# Patient Record
Sex: Male | Born: 1935
Health system: Southern US, Community
[De-identification: ages and names within clinical notes are randomized; demographics above are authoritative.]

## PROBLEM LIST (undated history)

## (undated) DIAGNOSIS — Z95828 Presence of other vascular implants and grafts: Secondary | ICD-10-CM

## (undated) DIAGNOSIS — Z951 Presence of aortocoronary bypass graft: Secondary | ICD-10-CM

## (undated) DIAGNOSIS — I10 Essential (primary) hypertension: Secondary | ICD-10-CM

## (undated) DIAGNOSIS — N281 Cyst of kidney, acquired: Secondary | ICD-10-CM

## (undated) DIAGNOSIS — Z9889 Other specified postprocedural states: Secondary | ICD-10-CM

## (undated) DIAGNOSIS — Z86718 Personal history of other venous thrombosis and embolism: Secondary | ICD-10-CM

## (undated) DIAGNOSIS — Z87448 Personal history of other diseases of urinary system: Secondary | ICD-10-CM

## (undated) DIAGNOSIS — N138 Other obstructive and reflux uropathy: Secondary | ICD-10-CM

## (undated) DIAGNOSIS — I2510 Atherosclerotic heart disease of native coronary artery without angina pectoris: Secondary | ICD-10-CM

## (undated) DIAGNOSIS — E785 Hyperlipidemia, unspecified: Secondary | ICD-10-CM

## (undated) DIAGNOSIS — N401 Enlarged prostate with lower urinary tract symptoms: Secondary | ICD-10-CM

## (undated) DIAGNOSIS — R413 Other amnesia: Secondary | ICD-10-CM

## (undated) DIAGNOSIS — Z9289 Personal history of other medical treatment: Secondary | ICD-10-CM

## (undated) DIAGNOSIS — F0390 Unspecified dementia without behavioral disturbance: Secondary | ICD-10-CM

## (undated) DIAGNOSIS — M1990 Unspecified osteoarthritis, unspecified site: Secondary | ICD-10-CM

## (undated) HISTORY — DX: Essential (primary) hypertension: I10

## (undated) HISTORY — DX: Personal history of other venous thrombosis and embolism: Z86.718

## (undated) HISTORY — DX: Personal history of other diseases of urinary system: Z87.448

## (undated) HISTORY — DX: Personal history of other medical treatment: Z92.89

## (undated) HISTORY — DX: Presence of aortocoronary bypass graft: Z95.1

## (undated) HISTORY — DX: Hyperlipidemia, unspecified: E78.5

## (undated) HISTORY — DX: Cyst of kidney, acquired: N28.1

## (undated) HISTORY — DX: Benign prostatic hyperplasia with lower urinary tract symptoms: N40.1

## (undated) HISTORY — PX: OTHER SURGICAL HISTORY: SHX169

## (undated) HISTORY — DX: Other obstructive and reflux uropathy: N13.8

## (undated) HISTORY — DX: Atherosclerotic heart disease of native coronary artery without angina pectoris: I25.10

## (undated) HISTORY — PX: HERNIA REPAIR: SHX51

## (undated) HISTORY — DX: Presence of other vascular implants and grafts: Z95.828

---
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
## 1998-11-11 ENCOUNTER — Other Ambulatory Visit: Admission: RE | Admit: 1998-11-11 | Discharge: 1998-11-11 | Payer: Self-pay | Admitting: Urology

## 1999-11-22 ENCOUNTER — Encounter (INDEPENDENT_AMBULATORY_CARE_PROVIDER_SITE_OTHER): Payer: Self-pay

## 1999-11-22 ENCOUNTER — Ambulatory Visit (HOSPITAL_COMMUNITY): Admission: RE | Admit: 1999-11-22 | Discharge: 1999-11-22 | Payer: Self-pay | Admitting: *Deleted

## 2002-10-31 ENCOUNTER — Ambulatory Visit (HOSPITAL_COMMUNITY): Admission: RE | Admit: 2002-10-31 | Discharge: 2002-10-31 | Payer: Self-pay | Admitting: *Deleted

## 2002-10-31 ENCOUNTER — Encounter (INDEPENDENT_AMBULATORY_CARE_PROVIDER_SITE_OTHER): Payer: Self-pay | Admitting: *Deleted

## 2003-06-21 DIAGNOSIS — Z951 Presence of aortocoronary bypass graft: Secondary | ICD-10-CM

## 2003-06-21 DIAGNOSIS — I2510 Atherosclerotic heart disease of native coronary artery without angina pectoris: Secondary | ICD-10-CM

## 2003-06-21 HISTORY — DX: Atherosclerotic heart disease of native coronary artery without angina pectoris: I25.10

## 2003-06-21 HISTORY — DX: Presence of aortocoronary bypass graft: Z95.1

## 2003-12-11 ENCOUNTER — Ambulatory Visit (HOSPITAL_COMMUNITY): Admission: RE | Admit: 2003-12-11 | Discharge: 2003-12-11 | Payer: Self-pay | Admitting: Cardiology

## 2003-12-23 ENCOUNTER — Inpatient Hospital Stay (HOSPITAL_COMMUNITY): Admission: AD | Admit: 2003-12-23 | Discharge: 2003-12-29 | Payer: Self-pay | Admitting: Cardiology

## 2003-12-23 HISTORY — PX: CARDIAC CATHETERIZATION: SHX172

## 2003-12-25 DIAGNOSIS — Z951 Presence of aortocoronary bypass graft: Secondary | ICD-10-CM | POA: Insufficient documentation

## 2003-12-25 HISTORY — PX: CORONARY ARTERY BYPASS GRAFT: SHX141

## 2004-06-20 DIAGNOSIS — Z9889 Other specified postprocedural states: Secondary | ICD-10-CM

## 2004-06-20 HISTORY — DX: Other specified postprocedural states: Z98.890

## 2004-06-29 ENCOUNTER — Inpatient Hospital Stay (HOSPITAL_COMMUNITY): Admission: RE | Admit: 2004-06-29 | Discharge: 2004-07-04 | Payer: Self-pay | Admitting: Vascular Surgery

## 2004-06-29 ENCOUNTER — Encounter (INDEPENDENT_AMBULATORY_CARE_PROVIDER_SITE_OTHER): Payer: Self-pay | Admitting: *Deleted

## 2005-02-18 ENCOUNTER — Encounter (INDEPENDENT_AMBULATORY_CARE_PROVIDER_SITE_OTHER): Payer: Self-pay | Admitting: Specialist

## 2005-02-18 ENCOUNTER — Ambulatory Visit (HOSPITAL_COMMUNITY): Admission: RE | Admit: 2005-02-18 | Discharge: 2005-02-18 | Payer: Self-pay | Admitting: *Deleted

## 2005-03-29 HISTORY — PX: OTHER SURGICAL HISTORY: SHX169

## 2007-07-02 ENCOUNTER — Encounter (INDEPENDENT_AMBULATORY_CARE_PROVIDER_SITE_OTHER): Payer: Self-pay | Admitting: *Deleted

## 2007-07-02 ENCOUNTER — Ambulatory Visit (HOSPITAL_COMMUNITY): Admission: RE | Admit: 2007-07-02 | Discharge: 2007-07-02 | Payer: Self-pay | Admitting: *Deleted

## 2008-04-14 ENCOUNTER — Encounter: Admission: RE | Admit: 2008-04-14 | Discharge: 2008-04-14 | Payer: Self-pay | Admitting: Internal Medicine

## 2010-08-30 ENCOUNTER — Other Ambulatory Visit: Payer: Self-pay | Admitting: Internal Medicine

## 2010-08-30 DIAGNOSIS — I714 Abdominal aortic aneurysm, without rupture, unspecified: Secondary | ICD-10-CM

## 2010-09-01 ENCOUNTER — Ambulatory Visit
Admission: RE | Admit: 2010-09-01 | Discharge: 2010-09-01 | Disposition: A | Discharge status: Home / Self Care | Payer: Medicare Other | Source: Ambulatory Visit | Attending: Internal Medicine | Admitting: Internal Medicine

## 2010-09-01 DIAGNOSIS — I714 Abdominal aortic aneurysm, without rupture, unspecified: Secondary | ICD-10-CM

## 2010-10-19 DIAGNOSIS — N281 Cyst of kidney, acquired: Secondary | ICD-10-CM

## 2010-10-19 DIAGNOSIS — Z9289 Personal history of other medical treatment: Secondary | ICD-10-CM

## 2010-10-19 HISTORY — DX: Cyst of kidney, acquired: N28.1

## 2010-10-19 HISTORY — DX: Personal history of other medical treatment: Z92.89

## 2010-10-22 HISTORY — PX: OTHER SURGICAL HISTORY: SHX169

## 2010-11-02 NOTE — Op Note (Signed)
NAME:  RANARD, HARTE NO.:  [id number]   MEDICAL RECORD NO.:  [id number]          PATIENT TYPE:  AMB   LOCATION:  ENDO                         FACILITY:  [facility]   PHYSICIAN:  Georgiana Spinner, M.D.    DATE OF BIRTH:  1927-08-16   DATE OF PROCEDURE:  07/02/2007  DATE OF DISCHARGE:                               OPERATIVE REPORT   PROCEDURE:  Colonoscopy with polypectomy and biopsy.   INDICATIONS:  Colon polyps, colon cancer screening.   ANESTHESIA:  Fentanyl 50 mcg, Versed 2 mg.   PROCEDURE:  With the patient mildly sedated in the left lateral  decubitus position, a rectal exam was performed which was unremarkable.  Subsequently the Pentax videoscopic colonoscope was inserted the rectum  and passed under direct vision with pressure applied to reach the cecum  identified by ileocecal valve and base of cecum both which were  photographed.  From this point the colonoscope was slowly withdrawn  taking circumferential views of colonic mucosa stopping in the ascending  colon where two polyps were seen and each were removed first using snare  cautery technique setting of 20/150 blended current.  There was some  residual polyp at each site that was removed with hot biopsy forceps and  all the tissue was retrieved and placed in one bottle.  The endoscope  was then further withdrawn taking circumferential views of remaining  colonic mucosa stopping in the rectum which appeared normal on direct  and retroflexed view.  The endoscope was straightened and withdrawn.  The patient's vital signs, pulse oximeter remained stable.  The patient  tolerated procedure well without apparent complications.   FINDINGS:  Two polyps of ascending colon both under a centimeter in  size, both photographed and removed as described above.   PLAN:  Await biopsy report.  The patient will call me for results and  follow-up with me as needed as an outpatient.     ______________________________  Georgiana Spinner, M.D.     GMO/MEDQ  D:  07/02/2007  T:  07/02/2007  Job:  [id number]

## 2010-11-05 NOTE — H&P (Signed)
NAME:  Charles Terry, Charles Terry NO.:  [id number]   MEDICAL RECORD NO.:  [id number]          PATIENT TYPE:  INP   LOCATION:  NA                           FACILITY:  [facility]   PHYSICIAN:  Larina Earthly, M.D.    DATE OF BIRTH:  1928-03-03   DATE OF ADMISSION:  06/29/2004  DATE OF DISCHARGE:                                HISTORY & PHYSICAL   CHIEF COMPLAINT:  Abdominal aortic aneurysm.   HISTORY OF PRESENT ILLNESS:  Charles Terry is a pleasant 77 year old male who  was referred by Dr. Aleen Campi for evaluation of an abdominal aortic aneurysm.  The patient is well known to the CVTS service after undergoing coronary  artery bypass graft x 5 by Dr. Cornelius Moras on December 25, 2003.  The patient has  recovered quite well from this surgery and has been referred to Dr. Arbie Cookey  for evaluation of an abdominal aortic aneurysm.  Dr. Arbie Cookey feels that the  patient is in good condition for elective repair of the patient's 5.4 cm  aneurysm.  Dr. Arbie Cookey has seen and examined the patient in clinic on May 06, 2004.  The risks, benefits and alternatives to the procedure were  discussed with the patient and his family at that time by Dr. Arbie Cookey.  The  patient is in understanding and has agreed to proceed with surgery.   The patient presents today for routine preoperative history and physical  assessment prior to surgery.  Currently the patient denies any symptoms  associated with his abdominal aneurysm, specifically abdominal pain, nausea,  vomiting, constipation, hematochezia, back pain, hematemesis, claudication-  like symptoms, peripheral edema, dysuria, hematuria, GERD-like symptoms,  shortness of breath or dyspnea on exertion.  The patient is in very good  health and continues to exercise daily.  He denies any recent cough or cold-  like symptoms.   PAST MEDICAL HISTORY:  1.  Severe three-vessel coronary artery disease.  2.  A 5.4 cm abdominal aortic aneurysm as described above.  3.  Benign prostatic  hypertrophy.   PAST SURGICAL HISTORY:  Coronary artery bypass graft x 5 completely by Dr.  Cornelius Moras on December 25, 2003.   CURRENT MEDICATIONS:  1.  Avodart 0.5 mg p.o. daily.  2.  Zocor 20 mg p.o. daily.  3.  Metoprolol 25 mg p.o. b.i.d.  4.  Plavix 75 mg p.o. daily.  5.  Micardis 40 mg p.o. daily.   ALLERGIES:  1.  ASPIRIN causes a rash.  2.  KIWI causes throat swelling.   REVIEW OF SYSTEMS:  Please see HPI for pertinent positives and negatives,  otherwise as follows:  HEENT:  The patient does wear glasses and has  extensive dental work.  Otherwise, he denies any headache, vision changes,  hearing changes or problems with swallowing.  CARDIAC:  The patient denies  any chest pain, chest pressure, arrhythmias or palpitations.  PULMONARY:  The patient denies any cough, sputum production, shortness of breath,  dyspnea on exertion or history of pneumonia.  GASTROINTESTINAL:  The patient  denies any hematochezia, melena, diarrhea or constipation.  GENITOURINARY:  The patient denies  any hematuria, dysuria, urgency or frequency.  MUSCULOSKELETAL:  The patient denies any joint pain or swelling.   FAMILY HISTORY:  The patient's mother died from a ruptured abdominal aortic  aneurysm.  The patient's father died at age 43 of cancer.  The patient's  brother died at age 70 of coronary artery disease.   SOCIAL HISTORY:  Charles Terry is married and has been for the past 46 years.  He has two grown children.  He is a retired Investment banker, operational.  He admits to  drinking two glasses of wine and two alcoholic beverages weekly.  The  patient does have a remote history of tobacco use of which he quit in the  1960s.  The patient did smoke for approximately 20 years.   PHYSICAL EXAMINATION:  VITAL SIGNS:  The patient's blood pressure is 160/70  today (home blood pressure monitoring reads 135/60s consistently).  The  pulse is 70 and regular and respirations are 16 and unlabored.  HEIGHT:  5 feet 8 inches.   WEIGHT:  170 pounds.  GENERAL APPEARANCE:  This is a pleasant, 77 year old, white male who appears  younger than his stated age of 72.  He is in no acute distress and is alert  and oriented x 3.  HEENT:  [place].  AT.  PERRLA.  EOMI.  The oral mucosa is pink and moist without  oral exudate or lesion.  The patient does have extensive dental work.  NECK:  Supple without JVD, bruit or lymphadenopathy.  CHEST:  Symmetrical on inspiration.  The lungs sounds are clear to  auscultation throughout without wheezing, rhonchi or rales.  CARDIAC:  Regular rate and rhythm without murmur, rub or gallop.  ABDOMEN:  Soft, nontender and nondistended with good bowel sounds in all  four quadrants.  There is a soft pulsatile mass in the mid abdomen.  There  is no evidence of bruit in the abdominal region.  GENITOURINARY:  Deferred.  RECTAL:  Deferred.  EXTREMITIES:  All extremities are warm and dry without cyanosis, clubbing,  edema or ulcerations.  PERIPHERAL PULSES:  Carotid pulses 2+ bilaterally.  Femoral pulses 2+  bilaterally.  Dorsalis pedis pulses 2+ bilaterally.  Posterior tibial pulses  2+ bilaterally.  NEUROLOGIC:  Cranial nerves are grossly intact.  Gait is steady.  Muscle  strength is equal and appropriate throughout.   ASSESSMENT AND PLAN:  This is a pleasant 77 year old male with a 5.4 cm  abdominal aortic aneurysm.  We will admit the patient to [facility]  on June 29, 2004, for elective repair and grafting of his abdominal  aortic aneurysm.  Dr. Arbie Cookey has seen and evaluated this patient prior to  this admission and has explained the risks and benefits of the procedure.  The patient is in understanding and has agreed to proceed as planned.      CAF/MEDQ  D:  06/25/2004  T:  06/25/2004  Job:  [id number]   cc:   Soyla Murphy. Renne Crigler, M.D.  [address]  [place]  [place] [address]  Fax: [phone]   Antionette Char, MD  [address] [place]  [place] [address]   Fax: [phone]

## 2010-11-05 NOTE — Discharge Summary (Signed)
NAME:  Charles Terry, Charles Terry NO.:  [id number]   MEDICAL RECORD NO.:  [id number]                   PATIENT TYPE:  INP   LOCATION:  2002                                 FACILITY:  [facility]   PHYSICIAN:  Salvatore Decent. Cornelius Moras, M.D.              DATE OF BIRTH:  11/05/1927   DATE OF ADMISSION:  12/23/2003  DATE OF DISCHARGE:  12/29/2003                                 DISCHARGE SUMMARY   PRIMARY ADMITTING DIAGNOSIS:  Shortness of breath.   ADDITIONAL/DISCHARGE DIAGNOSES:  1. Severe three-vessel coronary artery disease.  2. New-onset angina.  3. Hypertension.  4. Benign prostatic hypertrophy.  5. Hyperlipidemia.   PROCEDURES PERFORMED:  1. Cardiac catheterization.  2. Coronary artery bypass grafting x5 (left internal mammary artery to the     distal left anterior descending, sequential saphenous vein graft to the     first and second circumflex marginals, saphenous vein graft sequentially     to the posterior descending and posterolateral arteries).  3. Endoscopic vein harvest, right thigh, with open vein harvest, left lower     leg.   HISTORY:  The patient is a 77 year old male with a history of hypertension,  who has been in his usual state of health until recently, when he developed  a mild episode of exertional shortness of breath.  He was seen by Dr. Soyla Murphy. Pharr and a resting 12-lead EKG showed Q waves in the inferior leads  suggestive of a previous myocardial infarction.  The patient underwent a 2-D  echocardiogram which demonstrated normal left ventricular function.  He  subsequently underwent a stress Cardiolite exam which was abnormal, showing  ischemia involving the inferior wall, the apex and a septum, with a resting  ejection fraction of 49%.  He was referred to Dr. Aram Candela. Tysinger for  further evaluation and it was recommended that he undergo cardiac  catheterization.   HOSPITAL COURSE:  He was brought into [facility] on December 23, 2003  for elective cardiac catheterization.  He was found to have severe three-  vessel coronary artery disease which was not felt to be amenable to  percutaneous intervention.  He had well-preserved left ventricular function.  Because of these findings and his new-onset angina symptoms, he was admitted  for further evaluation and cardiothoracic surgery consultation.  The patient  was seen by Dr. Salvatore Decent. Cornelius Moras and it was agreed that his best course of  action would be to proceed with coronary artery bypass grafting.  Also of  note, at the time of cardiac catheterization, the patient was found to have  an abdominal aortic aneurysm infrarenally.  He has been asymptomatic from  this standpoint and it was not felt that this would limit his proceeding  with surgery.  He remained stable in house.  He underwent a CT of the  abdomen which confirmed a 5.4 x 4.5-cm abdominal aortic aneurysm.  Again,  this is not felt to be a reason to delay surgery and he was scheduled to  proceed.  He was taken to the operating room on December 25, 2003 and underwent  CABG x5 as described in detail above, performed by Dr. Cornelius Moras.  He tolerated  the procedure well and was transferred to the SICU in stable condition.  He  was able to extubated shortly after surgery.  He was hemodynamically stable  and doing well on postop day 1.  At that time, he was started on a beta  blocker as well as pulmonary toilet measures and diuresis.  His chest tubes  were removed as well as his hemodynamic monitoring devices.  He was  transferred to unit 2000.  Overall, he has done very well postoperatively.  He has been ambulating in the halls without difficulty.  He has been weaned  from supplemental oxygen and is maintaining O2 saturations of greater than  90% on room air.  He is in normal sinus rhythm.  His surgical incision sites  are all healing well.  He is tolerating a regular diet and is having normal  bowel and bladder function.  He has been  diuresed back down to within about  2 pounds of his preoperative weight and is not significantly volume-  overloaded on physical exam.  His most recent labs on December 28, 2003 showed a  hemoglobin of 11.3, hematocrit 32.1, white count 12,000, platelets 128,000;  BUN 16, creatinine 0.9, potassium 4.7.  His most recent chest x-ray showed  some resolving atelectasis.  On physical exam, his lungs are clear, he has  minimal lower extremity edema and his incisions are all healing well.  It is  felt that if he continues to remain stable over the next 24 hours, he will  hopefully be ready for discharge home on December 29, 2003.   DISCHARGE MEDICATIONS:  1. Plavix 75 mg daily.  2. Lopressor 25 mg b.i.d.  3. Zocor 20 mg daily.  4. Tylox 1-2 q.4 h. p.r.n. for pain.  5. Micardis 40 mg daily.  6. Avodart 0.5 mg daily.   DISCHARGE INSTRUCTIONS:  He is to refrain from driving, heavy lifting or  strenuous activity.  He may continue ambulating daily and using his  incentive spirometer.  He may shower daily and clean his incisions with soap  and water.  He is asked to continue a low-fat, low-sodium diet.   DISCHARGE FOLLOWUP:  He will make an appointment to see Dr. Aleen Campi in 2  weeks.  He will then follow up with Dr. Cornelius Moras in 3 weeks and our office will  contact him with an appointment date and time.  He will be scheduled for a  chest x-ray at [facility] 1 hour prior to this appointment  and should bring his films to our office for Dr. Cornelius Moras to review.  In the  interim, if he experiences any problems or has questions, he will contact  our office immediately.      Coral Ceo, P.A.                        Salvatore Decent. Cornelius Moras, M.D.    GC/MEDQ  D:  12/28/2003  T:  12/29/2003  Job:  [id number]   cc:   Soyla Murphy. Renne Crigler, M.D.  [address]  [place]  [place] [address]  Fax: [phone]   Aram Candela. Aleen Campi, M.D.  [address]  [place]  [address]  Fax: [phone]

## 2010-11-05 NOTE — Procedures (Signed)
[facility]  Patient:    Charles Terry, Charles Terry                     MRN: [id number] Adm. Date:  [id number] Attending:  Sabino Gasser                           Procedure Report  PROCEDURE:  Colonoscopy.  INDICATIONS:  Hemoccult-positive stools.  ANESTHESIA:  Demerol 50 mg, Versed 4 mg, given intravenously in divided dose.  DESCRIPTION OF PROCEDURE:  With patient mildly sedated in the left lateral decubitus position, the Olympus videoscopic colonoscope was inserted in the rectum after normal rectal exam was performed and passed under direct vision to the cecum.  Cecum identified by ileocecal valve and appendiceal orifice, both of which were photographed.  From this point, the colonoscope was slowly withdrawn, taking circumferential views of the entire colonic mucosa, stopping then in the cecum, ascending colon area, where two small polyps were seen, photographed, and using snare cautery technique, setting of 3 and 3 blended current, they were removed.  There was some further tissue at the bases, and each was removed, again using at this time hot biopsy forceps technique, setting of 3 and 3 blended current.  Once removed, the endoscope was then slowly withdrawn, taking circumferential views of the entire colonic mucosa, stopping in the rectosigmoid area at approximately 20 cm from the anal verge, at which point another polyp was seen, photographed, and using hot biopsy forceps technique this time alone, this polyp was removed.  There was good hemostasis throughout.  The endoscope was then pulled back to the rectum, which appeared normal on direct and retroflexed views.  The endoscope was straightened and withdrawn.  The patients vital signs and pulse oximetry remained stable.  The patient tolerated the procedure well without apparent complications.  FINDINGS:  Colonic polyposis of cecum/ascending colon and rectosigmoid.  Await biopsy report.  The patient will  call me for results and follow up with me as an outpatient. DD:  11/22/99 TD:  11/24/99 Job: [id number]

## 2010-11-05 NOTE — Discharge Summary (Signed)
NAME:  Charles Terry, Charles Terry NO.:  [id number]   MEDICAL RECORD NO.:  [id number]          PATIENT TYPE:  INP   LOCATION:  2002                         FACILITY:  [facility]   PHYSICIAN:  Larina Earthly, M.D.         DATE OF BIRTH:   DATE OF ADMISSION:  06/29/2004  DATE OF DISCHARGE:  07/04/2004                                 DISCHARGE SUMMARY   ADMISSION DIAGNOSIS:  A 5.4 cm infrarenal abdominal aortic aneurysm.   SECONDARY DIAGNOSES:  1.  5.4 cm infrarenal abdominal aortic aneurysm status post repair.  2.  History of severe three vessel coronary artery disease status post      coronary artery bypass grafting x5 by Dr. Cornelius Moras on December 25, 2003.  3.  History of benign prostatic hypertrophy.   PROCEDURES:  On June 29, 2004, he underwent resection of his abdominal  aortic aneurysm with repair using a straight graft 14 mm Hemashield aortic  graft. Surgeon:  Dr. Tawanna Cooler Early.   ALLERGIES:  ASPIRIN.   BRIEF HISTORY:  Charles Terry is a 77 year old Caucasian male who was referred  to Dr. Arbie Cookey by Dr. Aleen Campi for evaluation of abdominal aortic aneurysm. He  is well-known to the CVTS service after undergoing coronary artery bypass  grafting by Dr. Cornelius Moras this past July. Since he recovered well since surgery,  Dr. Arbie Cookey felt that he was now in good condition for elective repair of his  5 cm infrarenal abdominal aortic aneurysm. He was seen by Dr. Arbie Cookey in  November. At time, the risks, benefits, and alternatives were discussed, and  the patient agreed to undergo repair and grafting of his aneurysm. His  surgery was scheduled for January. Charles Terry was relatively asymptomatic of  his aneurysm denying abdominal pain, nausea, vomiting, constipation,  hematochezia, back pain, hematemesis, claudication, peripheral edema, reflux  symptoms, or dysuria. He also denies shortness of breath or dyspnea on  exertion.   HOSPITAL COURSE:  Charles Terry was electively admitted to [facility]  on  June 29, 2004 and did undergo repair and grafting of his abdominal  aortic aneurysm. There were no known intraoperative complications and he was  transferred from the operating room to the surgical intensive care unit in  stable condition. By later that evening, he was extubated and neurologically  intact. He had mild incisional soreness but was overall doing well. His  extremities were well-perfused. Labs were also stable.   On postoperative day #1, Charles Terry was noted to have some confusion  overnight; however, this had cleared by morning. Chest x-ray was clear. His  abdomen remained soft but moderately distended. His extremities remained  well-perfused. Nasogastric tube output was decreased at 115 mL over the last  8 hours. His nasogastric tube was discontinued. He began to mobilize. He was  left n.p.o. due to his abdominal distention. By postoperative day #2, there  were no further episodes of confusion. His abdomen remained soft. He was  advanced to ice chips only. He was felt safe for transfer out of the unit  and out to the floor. By postoperative  day #3, he was felt stable to begin a  clear liquid diet. He had also had a bowel movement. By postoperative day  #4, he was given a regular diet and tolerated that well. He was ambulating  independently. He was restarted on most of his home medications. By  postoperative day #5, Charles Terry was felt to have progressed quite nicely.  He had remained hemodynamic throughout his hospitalization. His last vital  signs showed a blood pressure of 128/78 with a heart rate in the 70s and  80s, and in sinus rhythm. He was afebrile and saturating 99% on room air.  His urine output remained adequate. His bowels continued to function  appropriately. His pain was controlled on oral medication. On exam, his  heart had a regular rate and rhythm. His lungs were clear. His abdomen was  soft, not tender with only minimal distention. He had good bowel  sounds in  all four quadrants and his incision was healing well with no erythema.  Staples were intact. His extremities remained well-perfused with palpable  pulses. Due to his excellent progression, he was felt stable for discharge  home and was discharged home with his wife on July 04, 2004 in stable  condition.   DISCHARGE LABORATORY DATA:  Labs on January 12 showed a white blood count  9.6, hemoglobin 11.4, hematocrit 33.2, and platelet count 113.  Sodium 137,  potassium 3.9, blood glucose 146, BUN 6, creatinine 0.7. Postoperative liver  function tests showed an alkaline phosphatase of 29, total bilirubin of 0.6,  SGOT 23, SGPT 19. Total bilirubin was 5.2, albumin was 2.6.   DISCHARGE MEDICATIONS:  1.  He is to resume his home medications which are as follows.  2.  Avodart 0.5 mg p.o. once daily.  3.  Zocor 20 mg p.o. once daily.  4.  Metoprolol 25 mg p.o. b.i.d.  5.  Plavix 75 mg p.o. once daily.  6.  Micardis 40 mg p.o. once daily.  7.  Pain medications:  Tylox one to two tablets p.o. q.4-6h. p.r.n. pain.   ACTIVITY:  He was instructed to avoid driving, heavy lifting, or strenuous  activity but was encouraged to continue daily walking and increasing  exercise.   DISCHARGE DIET:  He is to follow a low-fat, low-salt diet.   DISCHARGE INSTRUCTIONS:  He may shower daily including his incision gently  with mild soap and water. He is to notify the CTS office if he develops  fever, redness or drainage from his incision site as well as decrease in his  bowel function.   FOLLOWUP:  1.  He has a nurse visit at the CVTS office for staple removal on July 08, 2004 at 9 a.m.  2.  He is to follow up with Dr. Arbie Cookey in the CVTS office on July 15, 2004      at 11:30 a.m.      AWZ/MEDQ  D:  07/04/2004  T:  07/04/2004  Job:  [id number]   cc:   Larina Earthly, M.D.  [address]  [place]  [place] [address]   Antionette Char, MD [address]  [place]  [place]  [address]  Fax: [phone]   Salvatore Decent. Cornelius Moras, M.D.  [address]  [place]  [place] [address]   Soyla Murphy. Renne Crigler, M.D.  [address]  [place]  [place] [address]  Fax: [phone]

## 2010-11-05 NOTE — Consult Note (Signed)
NAME:  Charles Terry, Charles Terry NO.:  [id number]   MEDICAL RECORD NO.:  [id number]                   PATIENT TYPE:  OIB   LOCATION:  2035                                 FACILITY:  [facility]   PHYSICIAN:  Salvatore Decent. Cornelius Moras, M.D.              DATE OF BIRTH:  01-Jul-1927   DATE OF CONSULTATION:  12/23/2003  DATE OF DISCHARGE:                                   CONSULTATION   PHYSICIANS:  Referring physician:  Aram Candela. Aleen Campi, M.D.  Primary care physician:  Soyla Murphy. Renne Crigler, M.D.   REASON FOR CONSULTATION:  Severe three-vessel coronary artery disease.   HISTORY OF PRESENT ILLNESS:  Charles Terry is a 77 year old gentleman from  [place] with no previous history of coronary artery disease.  Risk  factors include history of hypertension as well as a family history of  coronary artery disease.  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  He has been followed carefully  by Dr. Renne Crigler and recently was noted to have developed mild episode of  exertional shortness of breath.  Resting 12-lead electrocardiogram was  notably abnormal with Q waves in the inferior leads suggestive of previous  myocardial infarction.  The patient underwent a 2-D echocardiogram which  demonstrated normal left ventricular function.  He subsequently underwent a  stress Cardiolite exam which was abnormal.  This revealed evidence of  ischemia involving the inferior wall, the apex, and the septum with resting  ejection fraction of 49%.  The patient subsequently was brought in for  elective cardiac catheterization.  This was performed today by Dr. Aleen Campi,  and findings are notable for the presence of severe three-vessel coronary  artery disease with preserved left ventricular function.  Cardiac surgical  consultation has been requested to consider surgical revascularization.  Findings  at the time of catheterization are also notable for the presence of  an infrarenal abdominal aortic aneurysm.   REVIEW OF SYSTEMS:  GENERAL:  The patient reports feeling well.  He has good  appetite.  He has not been losing weight or gaining weight recently.  CARDIAC:  The patient reports recent onset of exertional substernal chest  tightness over the last few weeks.  He states that this only occurs when he  is walking at a brisk pace, and the symptoms are always relieved by rest.  He does not get these symptoms with other daily activities, and he  specifically denies any episodes of similar pain at rest or at night when he  is sleeping.  He has had only occasional episodes of mild exertional  shortness of breath, and for the most part, he denies any shortness of  breath.  He specifically denies any PND, orthopnea, or lower extremity  edema.  He has not had any palpitations or syncopal episodes.  RESPIRATORY:  Negative.  The patient  denies productive cough, hemoptysis, wheezing.  GASTROINTESTINAL:  Negative.  The patient has no difficulty swallowing.  He  reports normal bowel function.  He denies symptoms of reflux.  He denies  history of hematochezia, hematemesis, melena.  MUSCULOSKELETAL:  Negative.  The patient denies problems with arthritis or arthralgias.  NEUROLOGIC:  Negative.  The patient denies symptoms suggestive of previous TIA or stroke.  INFECTIOUS:  Negative.  The patient denies recent fevers or chills.  GENITOURINARY:  Notable for urinary frequency.  The patient denies dysuria  or urinary urgency or hematuria.  He has been evaluated and followed  carefully by Dr. Wanda Plump for many years with benign prostatic hypertrophy,  and his symptoms are stable.  PSYCHIATRIC:  Negative.  The patient denies  recent stress or depression.  HEENT:  Negative.  The patient wears glasses  for corrective vision which is stable.  He has no loose teeth or painful  teeth.   PAST MEDICAL  HISTORY:  1. Hypertension.  2. Benign prostatic hypertrophy.   PAST SURGICAL HISTORY:  None.   FAMILY HISTORY:  The patient's brother died of myocardial infarction at age  75.  He has another brother who remains alive and well but has history of  coronary artery disease.   SOCIAL HISTORY:  The patient is retired and lives with his wife here in  [place].  They have two children and several grandchildren.  He remains  quite active physically and has not had any significant physical  limitations.  He has a remote history of tobacco use, although he quit  smoking more than 40 years ago.  He denies history of excessive alcohol  consumption.   CURRENT MEDICATIONS:  1. Micardis 40 mg once daily.  2. Avadart 0.5 mg once daily.  3. Multivitamins 1 tablet daily.  4. Fish oil capsules 2 tablets daily.   DRUG ALLERGIES:  ASPIRIN has caused a diffuse rash in the very distant past.  The patient reports sensitivity to KIWI FRUIT.   PHYSICAL EXAMINATION:  GENERAL:  Well-appearing male who appears somewhat  younger than stated age in no acute distress.  VITAL SIGNS:  He is currently in normal sinus rhythm and afebrile and  normotensive.  HEENT:  Exam is grossly unrevealing.  NECK:  Supple.  There is no cervical nor supraclavicular lymphadenopathy.  There is no jugular venous distention.  No carotid bruits are noted.  CHEST:  Auscultation includes clear breath sounds bilaterally.  No wheezes  or rhonchi are noted.  CARDIOVASCULAR:  Exam is notable for regular rate and rhythm.  No murmurs,  rubs, or gallops are noted.  ABDOMEN:  Soft and nontender.  No pulsatile masses are appreciated.  Bowel  sounds are present.  The liver edge is not palpable.  EXTREMITIES:  Warm and well perfused.  There is no lower extremity edema.  Distal pulses are easily palpable in both lower legs in the posterior tibial  position.  Palmar arch circulation is intact in the left wrist with notable normal capillary  refill of the left palm during compression of the radial  pulse.  There is no sign of venous insufficiency.  GU/RECTAL:  Exams both deferred.  NEUROLOGIC:  Grossly nonfocal and symmetrical throughout.   DIAGNOSTIC TESTS:  Cardiac catheterization performed by Dr. Aleen Campi today  has been reviewed.  This demonstrates critical three-vessel coronary artery  disease with anatomy unfavorable for percutaneous coronary intervention.  Specifically, there is codominant coronary circulation.  There is 100%  proximal occlusion of the left  anterior descending coronary artery.  There  is some faint late filling of the distal left anterior descending coronary  artery on left coronary injection.  This vessel is not well visualized.  There is 80% proximal stenosis of the left circumflex coronary artery and  80% stenosis of the mid portion of this vessel.  The left circumflex is a  very large vessel that gives rise to two large circumflex marginal branches  and a very large posterolateral branch.  There is 90% long segment proximal  stenosis of the first circumflex marginal branch.  There is 100% proximal  occlusion of the right coronary artery with left-to-right collateral filling  of the posterior descending artery coronary artery.  Left ventricular  function appears preserved with normal left ventricular wall motion.  There  is evidence suggesting medium-size infrarenal abdominal aortic aneurysm as  depicted on aortogram.  No other significant abnormalities are identified.   IMPRESSION:  Severe three-vessel coronary artery disease with normal left  ventricular function, abnormal stress Cardiolite exam, and recent onset  exertional substernal chest pain.  I believe that Charles Terry would best be  treated by elective coronary artery bypass grafting.  I also agree with  proceeding with abdominal ultrasound and/or CT scan to more definitively  establish the size of the patient's infrarenal abdominal aortic  aneurysm.   PLAN:  I have outlined options at length with Charles Terry here in his  hospital room this afternoon.  Alternative treatment strategies have been  discussed.  He understands and accepts all associated risks of surgery  including but not limited to risk of death, stroke, myocardial infarction,  congestive heart failure, respiratory failure, pneumonia, bleeding requiring  blood transfusion, arrhythmia, infection, and recurrent coronary artery  disease.  All of his questions have been addressed.  We tentatively plan to  proceed with surgery on Thursday, July 7.                                               Salvatore Decent. Cornelius Moras, M.D.    CHO/MEDQ  D:  12/23/2003  T:  12/23/2003  Job:  [id number]   cc:   Aram Candela. Aleen Campi, M.D.  [address]  [place]  [place] [address]  Fax: [phone]   Soyla Murphy. Renne Crigler, M.D.  [address]  [place]  [place] [address]  Fax: [phone]

## 2010-11-05 NOTE — Op Note (Signed)
NAME:  Charles Terry, Charles Terry NO.:  [id number]   MEDICAL RECORD NO.:  [id number]          PATIENT TYPE:  AMB   LOCATION:  ENDO                         FACILITY:  [facility]   PHYSICIAN:  Georgiana Spinner, M.D.    DATE OF BIRTH:  [birth year]   DATE OF PROCEDURE:  DATE OF DISCHARGE:  02/18/2005                                 OPERATIVE REPORT   PROCEDURE:  Colonoscopy with polypectomy.   ENDOSCOPIST:  Georgiana Spinner, M.D.   INDICATIONS:  Colon polyps.   ANESTHESIA:  Demerol 30, Versed 3 mg.   PROCEDURE:  With the patient mildly sedated in the left lateral decubitus  position, a rectal exam was performed which was unremarkable.  Subsequently,  the Olympus videoscopic colonoscope was inserted in the rectum and passed  under direct vision to the cecum identified by the ileocecal valve and  appendiceal orifice, both which were photographed.  From this point, the  colonoscope was slowly withdrawn taking circumferential views of colonic  mucosa stopping just proximal to the splenic flexure at which point a polyp  was seen, photographed and removed using snare cautery technique, setting of  20/200 blended current.  We next stopped in the rectum, which appeared  normal on direct and showed a polyp on retroflexed view, which we removed  using hot biopsy forceps technique, setting 20/200 blended current.  The  endoscope was straightened and withdrawn.  The patient's vital signs and  pulse oximeter remained stable.  The patient tolerated procedure well  without apparent complications.   FINDINGS:  1.  Small polyp of rectum.  2.  Moderate size polyp of splenic flexure.   PLAN:  1.  Await biopsy reports.  2.  The patient will call me for results and follow-up with me as an      outpatient           ______________________________  Georgiana Spinner, M.D.     GMO/MEDQ  D:  02/18/2005  T:  02/18/2005  Job:  [id number]

## 2010-11-05 NOTE — Cardiovascular Report (Signed)
NAME:  Charles Terry, Charles Terry NO.:  [id number]   MEDICAL RECORD NO.:  [id number]                   PATIENT TYPE:  OIB   LOCATION:  2035                                 FACILITY:  [facility]   PHYSICIAN:  Aram Candela. Tysinger, M.D.              DATE OF BIRTH:  Feb 15, 1928   DATE OF PROCEDURE:  12/23/2003  DATE OF DISCHARGE:                              CARDIAC CATHETERIZATION   REFERRING PHYSICIAN:  Soyla Murphy. Renne Crigler, M.D.   PROCEDURE:  1. Left heart catheterization.  2. Coronary cine angiography.  3. Left internal mammary artery cine angiography.  4. Left ventricular cine angiography.  5. Abdominal aortogram.  6. AngioSeal of the right femoral artery.   CARDIOLOGIST:  Aram Candela. Aleen Campi, M.D.   INDICATIONS:  This 77 year old male is essentially asymptomatic and had  recent change in his electrocardiogram and a stress test was abnormal  followed by a stress Cardiolite which showed reversible ischemia in his  apex, septum and inferior wall.  He was hence scheduled for cardiac  catheterization.  He also has a history of hypertension.   DESCRIPTION OF PROCEDURE:  After signing an informed consent, the patient  was premedicated with 5 mg of Valium by mouth and brought to the cardiac  catheterization lab.  His right groin was prepped and draped in a sterile  fashion and anesthetized locally with 1% lidocaine.  A #6 French introducer  sheath was inserted percutaneously into the right femoral artery.  A 6  French #4 Judkins coronary catheters were used to make injections into the  native coronary arteries.  The right coronary catheter was used to make mid  stream in the left subclavian artery visualizing the left internal mammary  artery.  A 6 French pigtail catheter was used to measure pressures in the  left ventricle and aorta and to make midstream injections into the left  ventricle and abdominal aorta.  The patient tolerated the procedure well,  and no complications  were noted.  At the end of the procedure, the catheter  and sheath were removed from the right femoral artery, and hemostasis was  easily obtained with an AngioSeal closure system.   MEDICATIONS GIVEN:  None.   CINE FINDINGS:  Coronary cine angiography:   Left coronary artery:  The ostium and left main appear normal.   Left anterior descending:  The LAD is totally occluded in the proximal  segment and there is fair to poor collateral circulation in the distal  segment.  It is poorly visualized.   Circumflex coronary artery:  The circumflex is a large dominant vessel  supplying the posterior lateral circulation to the left ventricle.  The  proximal segment has severe disease with two focal eccentric stenotic  lesions prior to the takeoff of the first obtuse marginal branch.  The first  lesion appears to be 90% stenotic and the second lesion is 80-90% stenotic.  The second lesion also involves  the takeoff of the first diagonal branch.  The second diagonal branch appears normal.  The large posterior lateral  branch has a mild irregularity in its proximal segment.   The right coronary artery is totally occluded at its origin.  There is  collateral circulation to the mid and distal segment including the posterior  descending branch from injections into the left coronary artery.  The distal  circulation is fair with fair visualization.   The left internal mammary artery appears normal.   LEFT VENTRICULAR CINE ANGIOGRAM:  The left ventricular chamber size and  contractility appear normal.  The ejection fraction is 60-70%.  There are no  abnormally moving segments.  The mitral and aortic valves appear normal.   ABDOMINAL AORTOGRAM:  The abdominal aorta has diffuse atherosclerotic plaque  which is mildly calcified in the distal segment.  There is a focal well  defined aneurysm in the mid infrarenal segment, and there is a normal  segment of approximately 2 cm prior to the bifurcation.   There is calcified  atherosclerotic plaque in both common iliac arteries.   FINAL DIAGNOSES:  1. Critical three vessel coronary artery disease with total occlusion of the     proximal left anterior descending, total occlusion of the proximal right     coronary artery and 90% stenosis of the proximal circumflex.  2. Fair to poor distal collaterals to the left anterior descending and right     coronary artery.  3. Normal left internal mammary artery.  4. Normal left ventricular function.  5. Abdominal aortic aneurysm, moderate to severe.  Focal infrarenal.  6. Successful AngioSeal of the right femoral artery.   DISPOSITION:  Because of the critical appearance of his coronary artery  disease, we will admit to telemetry and ask CVTS to see concerning coronary  artery bypass graft surgery.  We will also ask Dr. Alanda Amass to see him  concerning his abdominal aortic aneurysm for possible intravascular  exclusion graft placement in the future.  We will get a CT scan of his  abdomen with an aneurysm protocol while he is admitted.                                               John R. Aleen Campi, M.D.    JRT/MEDQ  D:  12/23/2003  T:  12/23/2003  Job:  [id number]   cc:   Soyla Murphy. Renne Crigler, M.D.  [address]  [place]  [place] [address]  Fax: [phone]   Richard A. Alanda Amass, M.D.  [phone]  [place]  [place] [address]  Fax: [phone]   CVTS

## 2010-11-05 NOTE — Op Note (Signed)
NAME:  MAXTYN, NUZUM NO.:  [id number]   MEDICAL RECORD NO.:  [id number]                   PATIENT TYPE:  INP   LOCATION:  2303                                 FACILITY:  [facility]   PHYSICIAN:  Salvatore Decent. Cornelius Moras, M.D.              DATE OF BIRTH:  01-22-1928   DATE OF PROCEDURE:  12/25/2003  DATE OF DISCHARGE:                                 OPERATIVE REPORT   PREOPERATIVE DIAGNOSIS:  Severe three-vessel coronary artery disease with  new onset angina.   POSTOPERATIVE DIAGNOSIS:  Severe three-vessel coronary artery disease with  new onset angina.   OPERATION PERFORMED:  Median sternotomy for coronary artery bypass grafting  times five (left internal mammary artery to distal left anterior descending  coronary artery, saphenous vein graft to first circumflex marginal branch  and sequential saphenous vein graft to second circumflex marginal branch,  saphenous vein graft to posterior descending coronary artery and sequential  saphenous vein graft to left posterolateral branch).   SURGEON:  Salvatore Decent. Cornelius Moras, M.D.   ASSISTANT:  1. Gwenith Daily. Tyrone Sage, M.D.  2. Claudette Royston Sinner, N.P.   ANESTHESIA:  General.   INDICATIONS FOR PROCEDURE:  The patient is a 77 year old male with no  previous cardiac history.  He was noted to have an abnormal EKG and he  underwent a stress Cardiolite exam which was abnormal prompting elective  cardiac catheterization.  Cardiac catheterization confirmed the presence of  critical three-vessel coronary artery disease with preserved left  ventricular function.  A full consultation note has been dictated  previously.   OPERATIVE CONSENT:  The patient has been counseled at length regarding the  indications and potential benefits of coronary artery bypass grafting.  Alternative treatment strategies have been discussed.  He understands and  accepts all associated risks of surgery including but not limited to risks  of death,  stroke, myocardial infection, congestive heart failure,  respiratory failure, pneumonia, bleeding requiring blood transfusion,  arrhythmia, infection, and recurrent coronary artery disease.  All of his  questions have been addressed.   DESCRIPTION OF PROCEDURE:  The patient was brought to the operating room on  the above mentioned date and central monitoring was established by the  anesthesia service under the care and direction of Dr. Bedelia Person.  Specifically, a Swann-Ganz catheter was placed through the right internal  jugular approach.  A radial arterial line was placed.  Intravenous  antibiotics were administered.  Following induction with general  endotracheal anesthesia, a Foley catheter was placed.  The patient's chest  abdomen, both groins, and both lower extremities were prepared and draped in  sterile manner.   Median sternotomy incision was performed and the left internal mammary  artery was dissected from the chest wall and prepared for bypass grafting.  The left internal mammary artery was good quality conduit.  Simultaneously  saphenous vein was obtained from the patient's right thigh and the upper  portion of the right lower leg using endoscopic vein harvest technique.  The  saphenous vein was good quality conduit.  The patient was heparinized  systemically.  The pericardium was opened.  The ascending aorta was  moderately sclerotic and mildly dilated.  There are no palpable plaques or  calcifications.  The ascending aorta and the right atrium were cannulated  for cardiopulmonary bypass.  A retrograde cardioplegia catheter was placed  through the right atrium into the coronary sinus.  Adequate heparinization  was verified.  Cardiopulmonary bypass was begun and the surface of the heart  was inspected.  Distal sites were selected for coronary artery bypass  grafting.  Portions of saphenous vein and the left internal mammary artery  were trimmed to appropriate length.   Temperature probe was placed in the  left ventricular septum. A cardioplegia catheter was placed in the ascending  aorta.   The patient was cooled to 32 degrees systemic temperature. The aortic cross-  clamp was applied and cardioplegia was delivered initially in an antegrade  fashion in the aortic root.  Iced saline slush was applied for topical  hypothermia. Supplemental cardioplegia was administered retrograde to the  coronary sinus catheter.  The initial cardioplegic arrest and myocardial  cooling were felt to be excellent.  Repeat doses of cardioplegia were  administered intermittently throughout the cross-clamp portion of the  operation both through the aortic root, and down subsequently placed vein  grafts and retrograde through the coronary sinus catheter to maintain septal  temperature below 15 degrees centigrade.  The following distal coronary  anastomoses were performed:  (1)  The posterior descending coronary artery  was grafted with a saphenous vein graft in a side-to-side fashion.  This  coronary measured 1.5 mm in diameter at the site of distal bypass and was of  good quality.  (2) The posterolateral branch off the distal left circumflex  coronary artery was grafted with a sequential saphenous vein graft using  vein placed to the posterior descending coronary artery.  This coronary  artery measured 1.8 mm in diameter and was of good quality at the site of  distal bypass.  (3)  The first circumflex marginal branch was grafted with a  saphenous vein graft in a side-to-side fashion.  This coronary measured 1.8  mm in diameter and was of good quality at the site of distal bypass.  (4)  The second circumflex marginal branch was grafted using a sequential  saphenous vein graft off the vein placed at the first circumflex marginal  branch.  This coronary measured 1.8 mm in diameter and was of good quality.  (5)  The distal left anterior descending coronary artery was grafted  with the left internal mammary artery in an end-to-side fashion.  This coronary  artery was chronically occluded proximally.  It was severely diseased and  small caliber.  It was poor quality target.  1.0 probe would pass in both  directions.  Both proximal saphenous vein anastomoses were performed  directly to the ascending aorta prior to removal of the aortic cross-clamp.  The septal temperature was noted to rise appropriately upon reperfusion of  the left internal mammary artery.  One final dose of warm retrograde hot  shot cardioplegia was administered.  The aortic cross-clamp was removed  after a total cross-clamp of 76 minutes.   The heart began to beat spontaneously without need for cardioversion. All  proximal and distal coronary artery anastomoses were inspected for  hemostasis and appropriate graft orientation.  Epicardial pacing wires were  fixed to the right ventricular outflow tract and to the right atrial  appendage.  The patient was rewarmed to 37 degrees centigrade temperature.  The patient was weaned from cardiopulmonary bypass without difficulty.  The  patient's rhythm at separation from bypass was junctional rhythm. AV  sequential pacing was employed.  Total cardiopulmonary bypass time for the  operation was 91 minutes.   The venous and arterial cannula were removed uneventfully.  Protamine was  administered to reverse the anticoagulation.  The mediastinum and the left  chest were irrigated with saline solution containing vancomycin.  Meticulous  surgical hemostasis was ascertained.  The mediastinum and the left chest  were drained with three chest tubes placed through separate stab incisions  inferiorly.  The median sternotomy was closed in routine fashion. The soft  tissues anterior to the sternum were closed in multiple layers and the skin  was closed with running subcuticular skin closure.   The patient tolerated the procedure well and was transported to the  surgical  intensive care unit in stable condition.  There were no intraoperative  complications.  All sponge, needle and instrument counts were verified  correct at completion of the operation.  No blood products were  administered.                                               Salvatore Decent. Cornelius Moras, M.D.    CHO/MEDQ  D:  12/25/2003  T:  12/25/2003  Job:  [id number]   cc:   Aram Candela. Aleen Campi, M.D.  [address]  [place]  [place] [address]  Fax: [phone]   Soyla Murphy. Renne Crigler, M.D.  [address]  [place]  [place] [address]  Fax: [phone]

## 2010-11-05 NOTE — Op Note (Signed)
NAME:  Charles Terry, Charles Terry NO.:  [id number]   MEDICAL RECORD NO.:  [id number]          PATIENT TYPE:  INP   LOCATION:  2899                         FACILITY:  [facility]   PHYSICIAN:  Larina Earthly, M.D.    DATE OF BIRTH:  10/19/1927   DATE OF PROCEDURE:  06/29/2004  DATE OF DISCHARGE:                                 OPERATIVE REPORT   PREOPERATIVE DIAGNOSIS:  Abdominal aortic aneurysm.   POSTOPERATIVE DIAGNOSIS:  Abdominal aortic aneurysm.   PROCEDURE:  Resection of abdominal aortic aneurysm, repair with a straight  graft, 14-mm Hemashield aortic graft.   SURGEON:  Dr. Tawanna Cooler Early.   ASSISTANT:  Dr. Elise Benne and Gershon Crane, P.A.-C.   ANESTHESIA:  General endotracheal.   COMPLICATIONS:  None.   DISPOSITION:  Recovery room, stable.   PROCEDURE IN DETAIL:  The patient was taken to the operating room, placed in  the supine position where the area of the abdomen and both groins were  prepped and draped in the usual sterile fashion.  The incision was made from  the level of the xiphoid to below the umbilicus, carried down through the  midline fascia with electrocautery.  The peritoneum was entered.  The small  and large bowel were normal.  The Omni-Tract retractor was used for  exposure.  The transverse colon and omentum were reflected superiorly and  the small bowel was reflected to the right.  The duodenum was mobilized off  the aneurysm.  The aorta was exposed.  The inferior mesentery vein was  ligated and divided.  Dissection was tented up to the level of the left  renal vein.  The aorta was encircled at the level of the left renal vein.  The aorta was then exposed down to the level of the aortic bifurcation and  the common iliac arteries were encircled with vessel loops bilaterally.  The  aneurysm stopped at the level of the aortic bifurcation.  The patient was  given 25 gm of Mannitol and 8000 units of intravenous heparin.  After  adequate circulation time,  the aorta was occluded below the level of the  renal arteries and both common iliac arteries were occluded with Henley  clamps.  The aneurysm was opened longitudinally and the lumbar back bleeders  were controlled with 2-0 silk figure-of-eight sutures.  The aorta was  transected below the level of the proximal clamp and the aorta was  transected above the aortic bifurcation.  A 14-mm Hemashield graft was  brought onto the field.  Using a felt strip for re-enforcement, the graft  was sewn end-to-end into the aorta, below the level of the renal arteries  and then cut to the appropriate length and sewn end-to-end to the aorta just  above the bifurcation.  Prior to completion of the distal anastomosis, the  usual flushing maneuvers were undertaken.  The anastomosis was completed and  the flow was restored to first the right leg and then the left leg when  blood pressure had stabilized.  The patient was given 100 mg Protamine to  reverse the heparin.  The wounds were  irrigated with saline.  Hemostasis was  achieved with electrocautery.  The wounds were closed with 2-0 Vicryl to  close the aneurysm wall over the graft.  Next, the retroperitoneum was  closed with a running 2-0 Vicryl suture.  Finally, the small bowel was run  its entirety, placed  back in the pelvis.  The omentum was placed over this.  The midline fascia  was closed with #1 PDS suture being crossed distally and tying in the  middle.  The skin was closed with skin clips.  The patient had topical pedal  pulses.  Sterile dressing was applied.  The patient was taken to the  recovery room in stable condition.                                               ______________________________  Larina Earthly, M.D.  Electronically Signed 07/12/2004 01:55:10pm EST    TFE/MEDQ  D:  06/29/2004  T:  06/29/2004  Job:  [id number]   cc:   Antionette Char, MD  [address]  [place]  [place] [address]  Fax: [phone]   Soyla Murphy. Renne Crigler,  M.D.  [address]  [place]  [place] [address]  Fax: [phone]

## 2010-11-05 NOTE — Op Note (Signed)
   NAME:  ZYHEIR, DAFT NO.:  [id number]   MEDICAL RECORD NO.:  [id number]                   PATIENT TYPE:  AMB   LOCATION:  ENDO                                 FACILITY:  [facility]   PHYSICIAN:  Georgiana Spinner, M.D.                 DATE OF BIRTH:  May 29, 1928   DATE OF PROCEDURE:  10/31/2002  DATE OF DISCHARGE:                                 OPERATIVE REPORT   PROCEDURE:  Colonoscopy with biopsy.   INDICATIONS:  Colon polyps.   ANESTHESIA:  Demerol 50 mg, Versed 5 mg.   DESCRIPTION OF PROCEDURE:  With the patient mildly sedated in the left  lateral decubitus position, the Olympus videoscopic colonoscope was inserted  in the rectum and passed under direct vision to the cecum, identified by the  ileocecal valve and appendiceal orifice, both of which were photographed.  From this point the colonoscope was slowly withdrawn, taking circumferential  views of the entire colonic mucosa, pulling back all the way to the rectum  and stopping only at 40 cm from the anal verge, at which point a small polyp  was seen and photographed and removed using hot biopsy forceps technique  with ERBE argon generator, setting of 20/150.  The rectum appeared normal on  direct and showed hemorrhoids on retroflexed view.  The endoscope was  straightened and withdrawn.  The patient's vital signs and pulse oximetry  remained stable.  The patient tolerated the procedure well without apparent  complications.   FINDINGS:  A small polyp at 40 cm from the anal verge, otherwise  unremarkable examination other than small hemorrhoids.   PLAN:  Await biopsy report.  The patient will call me for results and follow  up with me as an outpatient.                                               Georgiana Spinner, M.D.    GMO/MEDQ  D:  10/31/2002  T:  11/01/2002  Job:  [id number]

## 2010-11-08 ENCOUNTER — Ambulatory Visit (HOSPITAL_COMMUNITY)
Admission: RE | Admit: 2010-11-08 | Discharge: 2010-11-08 | Disposition: A | Discharge status: Home / Self Care | Payer: Medicare Other | Source: Ambulatory Visit | Attending: General Surgery | Admitting: General Surgery

## 2010-11-08 ENCOUNTER — Encounter (HOSPITAL_COMMUNITY)
Admission: RE | Admit: 2010-11-08 | Discharge: 2010-11-08 | Disposition: A | Discharge status: Home / Self Care | Payer: Medicare Other | Source: Ambulatory Visit | Attending: General Surgery | Admitting: General Surgery

## 2010-11-08 ENCOUNTER — Other Ambulatory Visit (HOSPITAL_COMMUNITY): Payer: Self-pay | Admitting: General Surgery

## 2010-11-08 DIAGNOSIS — Z01818 Encounter for other preprocedural examination: Secondary | ICD-10-CM | POA: Insufficient documentation

## 2010-11-08 DIAGNOSIS — R52 Pain, unspecified: Secondary | ICD-10-CM

## 2010-11-08 DIAGNOSIS — Z01812 Encounter for preprocedural laboratory examination: Secondary | ICD-10-CM | POA: Insufficient documentation

## 2010-11-08 LAB — DIFFERENTIAL
Basophils Absolute: 0.1 10*3/uL (ref 0.0–0.1)
Basophils Relative: 1 % (ref 0–1)
Lymphocytes Relative: 36 % (ref 12–46)
Monocytes Absolute: 0.7 10*3/uL (ref 0.1–1.0)
Neutro Abs: 4 10*3/uL (ref 1.7–7.7)

## 2010-11-08 LAB — COMPREHENSIVE METABOLIC PANEL WITH GFR
ALT: 20 U/L (ref 0–53)
AST: 20 U/L (ref 0–37)
Albumin: 3.6 g/dL (ref 3.5–5.2)
Alkaline Phosphatase: 42 U/L (ref 39–117)
BUN: 18 mg/dL (ref 6–23)
CO2: 28 meq/L (ref 19–32)
Calcium: 9.7 mg/dL (ref 8.4–10.5)
Chloride: 104 meq/L (ref 96–112)
Creatinine, Ser: 0.96 mg/dL (ref 0.4–1.5)
GFR calc non Af Amer: >60 mL/min
Glucose, Bld: 141 mg/dL — ABNORMAL HIGH (ref 70–99)
Potassium: 4.7 meq/L (ref 3.5–5.1)
Sodium: 139 meq/L (ref 135–145)
Total Bilirubin: 0.6 mg/dL (ref 0.3–1.2)
Total Protein: 7.5 g/dL (ref 6.0–8.3)

## 2010-11-08 LAB — CBC
Hemoglobin: 15 g/dL (ref 13.0–17.0)
MCHC: 34.6 g/dL (ref 30.0–36.0)
RDW: 13.9 % (ref 11.5–15.5)
WBC: 8 10*3/uL (ref 4.0–10.5)

## 2010-11-08 LAB — SURGICAL PCR SCREEN
MRSA, PCR: NEGATIVE
Staphylococcus aureus: NEGATIVE

## 2010-11-08 LAB — PROTIME-INR
INR: 1.03 (ref 0.00–1.49)
Prothrombin Time: 13.7 s (ref 11.6–15.2)

## 2010-11-12 ENCOUNTER — Observation Stay (HOSPITAL_COMMUNITY)
Admission: RE | Admit: 2010-11-12 | Discharge: 2010-11-12 | Disposition: A | Discharge status: Home / Self Care | Payer: Medicare Other | Source: Ambulatory Visit | Attending: General Surgery | Admitting: General Surgery

## 2010-11-12 DIAGNOSIS — N400 Enlarged prostate without lower urinary tract symptoms: Secondary | ICD-10-CM | POA: Insufficient documentation

## 2010-11-12 DIAGNOSIS — I714 Abdominal aortic aneurysm, without rupture, unspecified: Secondary | ICD-10-CM | POA: Insufficient documentation

## 2010-11-12 DIAGNOSIS — Z79899 Other long term (current) drug therapy: Secondary | ICD-10-CM | POA: Insufficient documentation

## 2010-11-12 DIAGNOSIS — I2510 Atherosclerotic heart disease of native coronary artery without angina pectoris: Secondary | ICD-10-CM | POA: Insufficient documentation

## 2010-11-12 DIAGNOSIS — I10 Essential (primary) hypertension: Secondary | ICD-10-CM | POA: Insufficient documentation

## 2010-11-12 DIAGNOSIS — K4090 Unilateral inguinal hernia, without obstruction or gangrene, not specified as recurrent: Principal | ICD-10-CM | POA: Insufficient documentation

## 2010-11-16 NOTE — Op Note (Signed)
NAME:  Charles Terry, HEAVIN NO.:  [id number]  MEDICAL RECORD NO.:  [id number]           PATIENT TYPE:  O  LOCATION:  5123                         FACILITY:  [facility]  PHYSICIAN:  Adolph Pollack, M.D.DATE OF BIRTH:  1927-10-29  DATE OF PROCEDURE:  11/12/2010 DATE OF DISCHARGE:  11/12/2010                              OPERATIVE REPORT   PREOPERATIVE DIAGNOSIS:  Left inguinal hernia.  POSTOPERATIVE DIAGNOSIS:  Large indirect left inguinal hernia.  PROCEDURE:  Left inguinal hernia repair with mesh.  SURGEON:  Adolph Pollack, MD  ANESTHESIA:  General/LMA with local (1.3% bupivacaine lysosomal).  INDICATIONS:  Mr. Chhim is an 77 year old man who has pain in the left testicular area and was found to have a left inguinal hernia which is reducible.  He now presents for repair.  We discussed the procedure risks and aftercare preoperatively.  TECHNIQUE:  He was seen in the holding area and left groin marked with my initials.  He was then brought to the operating room, placed supine on the operating room table and general anesthetic was administered by LMA.  The hair in the left groin and lower abdominal wall were clipped and the area was sterilely prepped and draped.  Local anesthetic was infiltrated superficially and deep in the left groin.  Left groin incision was made through the skin, subcutaneous tissue until the external oblique aponeurosis was identified.  I then injected local anesthetic deep to the external oblique aponeurosis. Following this, I made an incision in the external oblique aponeurosis through the external ring medially and up toward the anterior superior iliac spine laterally.  Using blunt dissection, I identified the shelving edge of the inguinal ligament inferiorly and internal oblique aponeurosis and muscle superiorly.  He had a large indirect hernia defect noted with the sac adherent to the cord.  I used blunt dissection to isolate the  cord created posterior window around it.  I then was able to grasp the hernia sac and contents.  Using blunt dissection and electrocautery, dissected free from the spermatic cord and reduced through the very patulous internal ring.  Following this, I then brought a piece of 3 x 6 inches polypropylene mesh in the field and anchored it 2 cm medial to the pubic tubercle. Using a running 2-0 Prolene suture, I anchored the inferior aspect of the mesh to the shelving edge of the inguinal ligament to the level of 2 cm lateral to the internal ring.  I then cut a slit of the mesh creating two tails.  The tails were wrapped around the spermatic cord.  The superior aspect of the mesh was anchored to the internal oblique aponeurosis with interrupted 2-0 Vicryl sutures.  The two tails of the mesh which had been crossed were then anchored to the shelving edge of the inguinal ligament with 2-0 Prolene suture creating a new internal ring.  Following this I inspected the wound and noted hemostasis was adequate. I injected more of a local anesthetic into the internal oblique aponeurosis and throughout the wound area.  I then closed the external oblique aponeurosis over the mesh and cord with running 3-0  Vicryl suture.  The subcutaneous tissue was closed with running 3-0 Vicryl suture.  The skin was closed with 4-0 Monocryl subcuticular stitch.  Steri-Strips and sterile dressing were applied.  He tolerated the procedure well without any apparent complications.  He subsequently was taken to the recovery room in satisfactory condition. He will be given discharge instructions and Vicodin for pain and will follow up in the office in 2-3 weeks.     Adolph Pollack, M.D.     Kari Baars  D:  11/12/2010  T:  11/13/2010  Job:  [id number]  Electronically Signed by Avel Peace M.D. on 11/16/2010 10:57:22 AM

## 2010-11-17 ENCOUNTER — Inpatient Hospital Stay (HOSPITAL_COMMUNITY)
Admission: EM | Admit: 2010-11-17 | Discharge: 2010-12-01 | DRG: 300 | Disposition: A | Discharge status: Home / Self Care | Payer: Medicare Other | Attending: Internal Medicine | Admitting: Internal Medicine

## 2010-11-17 ENCOUNTER — Ambulatory Visit (HOSPITAL_COMMUNITY)
Admission: RE | Admit: 2010-11-17 | Discharge: 2010-11-17 | Disposition: A | Discharge status: Home / Self Care | Payer: Medicare Other | Source: Ambulatory Visit | Attending: Internal Medicine | Admitting: Internal Medicine

## 2010-11-17 DIAGNOSIS — I10 Essential (primary) hypertension: Secondary | ICD-10-CM | POA: Diagnosis present

## 2010-11-17 DIAGNOSIS — R338 Other retention of urine: Secondary | ICD-10-CM | POA: Diagnosis present

## 2010-11-17 DIAGNOSIS — Z6830 Body mass index (BMI) 30.0-30.9, adult: Secondary | ICD-10-CM

## 2010-11-17 DIAGNOSIS — E785 Hyperlipidemia, unspecified: Secondary | ICD-10-CM | POA: Diagnosis present

## 2010-11-17 DIAGNOSIS — Z7901 Long term (current) use of anticoagulants: Secondary | ICD-10-CM

## 2010-11-17 DIAGNOSIS — E669 Obesity, unspecified: Secondary | ICD-10-CM | POA: Diagnosis present

## 2010-11-17 DIAGNOSIS — I2510 Atherosclerotic heart disease of native coronary artery without angina pectoris: Secondary | ICD-10-CM | POA: Diagnosis present

## 2010-11-17 DIAGNOSIS — Z951 Presence of aortocoronary bypass graft: Secondary | ICD-10-CM

## 2010-11-17 DIAGNOSIS — N2889 Other specified disorders of kidney and ureter: Secondary | ICD-10-CM | POA: Diagnosis present

## 2010-11-17 DIAGNOSIS — I824Z9 Acute embolism and thrombosis of unspecified deep veins of unspecified distal lower extremity: Secondary | ICD-10-CM | POA: Diagnosis present

## 2010-11-17 DIAGNOSIS — N179 Acute kidney failure, unspecified: Secondary | ICD-10-CM | POA: Diagnosis present

## 2010-11-17 DIAGNOSIS — K5900 Constipation, unspecified: Secondary | ICD-10-CM | POA: Diagnosis present

## 2010-11-17 DIAGNOSIS — N5089 Other specified disorders of the male genital organs: Secondary | ICD-10-CM | POA: Diagnosis present

## 2010-11-17 DIAGNOSIS — N281 Cyst of kidney, acquired: Secondary | ICD-10-CM | POA: Diagnosis present

## 2010-11-17 DIAGNOSIS — D62 Acute posthemorrhagic anemia: Secondary | ICD-10-CM | POA: Diagnosis present

## 2010-11-17 DIAGNOSIS — D696 Thrombocytopenia, unspecified: Secondary | ICD-10-CM | POA: Diagnosis present

## 2010-11-17 DIAGNOSIS — M7989 Other specified soft tissue disorders: Secondary | ICD-10-CM

## 2010-11-17 DIAGNOSIS — N390 Urinary tract infection, site not specified: Secondary | ICD-10-CM | POA: Diagnosis present

## 2010-11-17 DIAGNOSIS — N138 Other obstructive and reflux uropathy: Secondary | ICD-10-CM | POA: Diagnosis present

## 2010-11-17 DIAGNOSIS — I82409 Acute embolism and thrombosis of unspecified deep veins of unspecified lower extremity: Secondary | ICD-10-CM | POA: Insufficient documentation

## 2010-11-17 DIAGNOSIS — N401 Enlarged prostate with lower urinary tract symptoms: Secondary | ICD-10-CM | POA: Diagnosis present

## 2010-11-17 DIAGNOSIS — R319 Hematuria, unspecified: Secondary | ICD-10-CM | POA: Diagnosis present

## 2010-11-17 DIAGNOSIS — I824Y9 Acute embolism and thrombosis of unspecified deep veins of unspecified proximal lower extremity: Principal | ICD-10-CM | POA: Diagnosis present

## 2010-11-17 LAB — CBC
Hemoglobin: 14.3 g/dL (ref 13.0–17.0)
Platelets: 107 10*3/uL — ABNORMAL LOW (ref 150–400)
RBC: 4.37 MIL/uL (ref 4.22–5.81)
WBC: 9.9 10*3/uL (ref 4.0–10.5)

## 2010-11-17 LAB — DIFFERENTIAL
Basophils Relative: 0 % (ref 0–1)
Eosinophils Absolute: 0.4 10*3/uL (ref 0.0–0.7)
Neutro Abs: 6.5 10*3/uL (ref 1.7–7.7)
Neutrophils Relative %: 66 % (ref 43–77)

## 2010-11-17 LAB — BASIC METABOLIC PANEL
Chloride: 98 mEq/L (ref 96–112)
GFR calc Af Amer: 30 mL/min — ABNORMAL LOW (ref >60)
Potassium: 3.6 mEq/L (ref 3.5–5.1)
Sodium: 136 mEq/L (ref 135–145)

## 2010-11-18 ENCOUNTER — Inpatient Hospital Stay (HOSPITAL_COMMUNITY): Payer: Medicare Other

## 2010-11-18 ENCOUNTER — Other Ambulatory Visit: Payer: Self-pay | Admitting: Emergency Medicine

## 2010-11-18 LAB — CBC
Hemoglobin: 12.8 g/dL — ABNORMAL LOW (ref 13.0–17.0)
MCHC: 35.1 g/dL (ref 30.0–36.0)

## 2010-11-18 LAB — URINALYSIS, ROUTINE W REFLEX MICROSCOPIC
Nitrite: NEGATIVE
Protein, ur: 100 mg/dL — AB
Urobilinogen, UA: 0.2 mg/dL (ref 0.0–1.0)

## 2010-11-18 LAB — BASIC METABOLIC PANEL
Calcium: 8.6 mg/dL (ref 8.4–10.5)
Chloride: 103 mEq/L (ref 96–112)
Creatinine, Ser: 2.88 mg/dL — ABNORMAL HIGH (ref 0.4–1.5)
GFR calc Af Amer: 26 mL/min — ABNORMAL LOW (ref >60)

## 2010-11-18 LAB — HEPARIN LEVEL (UNFRACTIONATED)
Heparin Unfractionated: 0.33 IU/mL (ref 0.30–0.70)
Heparin Unfractionated: 0.49 IU/mL (ref 0.30–0.70)

## 2010-11-18 LAB — TSH: TSH: 2.087 u[IU]/mL (ref 0.350–4.500)

## 2010-11-18 LAB — APTT: aPTT: 28 seconds (ref 24–37)

## 2010-11-18 LAB — PROTIME-INR
INR: 1.13 (ref 0.00–1.49)
Prothrombin Time: 14.7 seconds (ref 11.6–15.2)

## 2010-11-18 LAB — URINE MICROSCOPIC-ADD ON

## 2010-11-18 LAB — SODIUM, URINE, RANDOM: Sodium, Ur: 47 mEq/L

## 2010-11-19 ENCOUNTER — Other Ambulatory Visit (HOSPITAL_COMMUNITY): Payer: Medicare Other

## 2010-11-19 LAB — DIFFERENTIAL
Basophils Absolute: 0.1 10*3/uL (ref 0.0–0.1)
Basophils Relative: 1 % (ref 0–1)
Eosinophils Absolute: 0.3 10*3/uL (ref 0.0–0.7)
Lymphs Abs: 2 10*3/uL (ref 0.7–4.0)
Neutrophils Relative %: 65 % (ref 43–77)

## 2010-11-19 LAB — BASIC METABOLIC PANEL
Chloride: 108 mEq/L (ref 96–112)
GFR calc Af Amer: >60 mL/min (ref >60)
Potassium: 3.5 mEq/L (ref 3.5–5.1)

## 2010-11-19 LAB — CBC
Platelets: 121 10*3/uL — ABNORMAL LOW (ref 150–400)
RBC: 3.75 MIL/uL — ABNORMAL LOW (ref 4.22–5.81)
WBC: 10.2 10*3/uL (ref 4.0–10.5)

## 2010-11-19 LAB — HEPARIN ANTI-XA: Heparin LMW: 0.51 IU/mL

## 2010-11-19 NOTE — H&P (Signed)
NAME:  Charles Terry, Charles Terry NO.:  [id number]  MEDICAL RECORD NO.:  [id number]           PATIENT TYPE:  E  LOCATION:  [facility]                         FACILITY:  [facility]  PHYSICIAN:  Eduard Clos, MDDATE OF BIRTH:  13-Dec-1927  DATE OF ADMISSION:  11/17/2010 DATE OF DISCHARGE:                             HISTORY & PHYSICAL   PRIMARY CARE PHYSICIAN:  Soyla Murphy. Renne Crigler, MD  CHIEF COMPLAINT:  Right lower extremity swelling.  HISTORY OF PRESENT ILLNESS:  An 77 year old male who has just recently had a left inguinal hernia surgery on Nov 12, 2010, that is almost 6 days ago, has had developing some swelling in the right lower extremity after which the patient had Dopplers done which shows right lower extremity DVT and was referred to the ER.  In the ER, the patient in addition found to be dehydrated with creatinine which has increased to 2.5 and was normal just last week.  The patient at this time does not have any chest pain, shortness of breath.  Has poor appetite.  Denies any nausea, vomiting.  Denies any abdominal pain or any pain in the surgical site.  Denies any dysuria, discharges.  Has loose stools, but no increased frequency.  In addition, the patient has also been noticing that he has been some times getting incontinence of urine after the surgery.  The patient has been admitted for workup.  PAST MEDICAL HISTORY: 1. CAD, status post CABG. 2. Hypertension. 3. History of abdominal aortic aneurysm, status post repair. 4. BPH. 5. Hypertension. 6. Hyperlipidemia.  PAST SURGICAL HISTORY:  CAD status post CABG and abdominal aortic aneurysm repair and a recent left inguinal hernia repair.  MEDICATIONS PRIOR TO ADMISSION:  Which needs to be verified includes 1. Avodart 0.5 mg daily. 2. Lopressor 25 mg p.o. b.i.d. 3. Micardis 40 mg. 4. Plavix 75. 5. Zocor 20.  ALLERGIES:  ASPIRIN which causes rash.  SOCIAL HISTORY:  The patient denies smoking cigarettes,  drinks 3 drinks a week, denies any drug abuse.  He is married, lives with his wife, full code.  FAMILY HISTORY:  Noncontributory.  REVIEW OF SYSTEMS:  As per the history of present illness, nothing else significant.  PHYSICAL EXAMINATION:  GENERAL:  The patient examined at bedside, not in acute distress. VITAL SIGNS:  Blood pressure 169/79, pulse 70 per minute, temperature 98.5, respirations 20 per minute, O2 sat 97%. HEENT:  Anicteric.  No pallor.  No discharge from ears, eyes, nose or mouth. CHEST:  Bilateral air entry present.  No rhonchi.  No crepitation. HEART:  S1 and S2 heard. ABDOMEN:  Soft, nontender.  Bowel sounds heard.  There is dressing and sutures seen in the left inguinal hernia. CNS:  Patient is alert, awake and oriented to time, place and person. Moves upper and lower extremities. EXTREMITIES:  Peripheral pulses felt.  There is mild swelling in the right lower extremity.  No cyanosis, clubbing or acute ischemic changes.  LABORATORY DATA:  Doppler of the right lower extremity shows positive for DVT in the right common femoral vein through to PTV and peroneal veins.  CBC, WBC is 9.9, hemoglobin is  14.3, hematocrit is 40.5, platelets are 107.  PT/INR 14.7 and 1.1.  Basic metabolic panel, sodium 136, potassium 3.6, chloride 98, carbon dioxide 27, glucose 131, BUN 43, creatinine 2.5, calcium 9.2.  ASSESSMENT: 1. Deep venous thrombosis of the right lower extremity. 2. Acute renal failure. 3. Recent left inguinal hernia repair. 4. History of coronary artery disease, status post coronary artery     bypass graft. 5. History of abdominal aortic aneurysm, status post repair. 6. History of hypertension. 7. History of benign prostatic hypertrophy. 8. Thrombocytopenia. 9. Urinary incontinence.  PLAN: 1. At this time, admit the patient to telemetry. 2. For his DVT, the patient had been already started on heparin IV.     We will continue with Coumadin.  At this time,  I think his risk     factor was the patient having rest of the surgery.  The patient     probably will need Coumadin at least for 3 months. 3. For his acute renal failure, at this time, I think it is from poor     oral intake and dehydration.  We will hydrate the patient.  We will     hold his Micardis for now.  We will place the patient on strict     intake and output.  We will check urine sodium, creatinine, and     eosinophils. 4. The patient does complain of some urinary incontinence,     particularly when he sleeps, we need to closely follow on this. 5. Further recommendation as condition evolves.     Eduard Clos, MD     ANK/MEDQ  D:  11/18/2010  T:  11/18/2010  Job:  [id number]  cc:   Soyla Murphy. Renne Crigler, M.D.  Electronically Signed by Midge Minium MD on 11/19/2010 07:48:30 AM

## 2010-11-20 ENCOUNTER — Inpatient Hospital Stay (HOSPITAL_COMMUNITY): Payer: Medicare Other

## 2010-11-20 LAB — GLUCOSE, CAPILLARY: Glucose-Capillary: 127 mg/dL — ABNORMAL HIGH (ref 70–99)

## 2010-11-20 LAB — BASIC METABOLIC PANEL
CO2: 24 mEq/L (ref 19–32)
Chloride: 107 mEq/L (ref 96–112)
Creatinine, Ser: 0.83 mg/dL (ref 0.4–1.5)
GFR calc Af Amer: >60 mL/min (ref >60)
Sodium: 139 mEq/L (ref 135–145)

## 2010-11-20 LAB — PROTIME-INR
INR: 1.76 — ABNORMAL HIGH (ref 0.00–1.49)
Prothrombin Time: 20.7 seconds — ABNORMAL HIGH (ref 11.6–15.2)

## 2010-11-20 LAB — CBC
HCT: 32 % — ABNORMAL LOW (ref 39.0–52.0)
HCT: 30 % — ABNORMAL LOW (ref 39.0–52.0)
Hemoglobin: 11.1 g/dL — ABNORMAL LOW (ref 13.0–17.0)
Hemoglobin: 10.2 g/dL — ABNORMAL LOW (ref 13.0–17.0)
MCHC: 34 g/dL (ref 30.0–36.0)
RDW: 13.6 % (ref 11.5–15.5)
RDW: 13.7 % (ref 11.5–15.5)
WBC: 16.2 10*3/uL — ABNORMAL HIGH (ref 4.0–10.5)
WBC: 16.9 10*3/uL — ABNORMAL HIGH (ref 4.0–10.5)

## 2010-11-20 LAB — ABO/RH: ABO/RH(D): AB POS

## 2010-11-20 LAB — HEPARIN LEVEL (UNFRACTIONATED): Heparin Unfractionated: 0.59 IU/mL (ref 0.30–0.70)

## 2010-11-20 MED ORDER — IOHEXOL 300 MG/ML  SOLN
100.0000 mL | Freq: Once | INTRAMUSCULAR | Status: AC | PRN
  Administered 2010-11-20: 55 mL via INTRAVENOUS

## 2010-11-21 ENCOUNTER — Inpatient Hospital Stay (HOSPITAL_COMMUNITY): Payer: Medicare Other

## 2010-11-21 ENCOUNTER — Encounter (HOSPITAL_COMMUNITY): Payer: Self-pay | Admitting: Radiology

## 2010-11-21 LAB — CBC
HCT: 32.9 % — ABNORMAL LOW (ref 39.0–52.0)
HCT: 34.6 % — ABNORMAL LOW (ref 39.0–52.0)
Hemoglobin: 11.8 g/dL — ABNORMAL LOW (ref 13.0–17.0)
MCH: 30.6 pg (ref 26.0–34.0)
MCH: 32 pg (ref 26.0–34.0)
MCHC: 33.7 g/dL (ref 30.0–36.0)
MCHC: 34 g/dL (ref 30.0–36.0)
MCHC: 34.1 g/dL (ref 30.0–36.0)
MCHC: 34.4 g/dL (ref 30.0–36.0)
MCV: 88.9 fL (ref 78.0–100.0)
Platelets: 134 10*3/uL — ABNORMAL LOW (ref 150–400)
Platelets: 141 10*3/uL — ABNORMAL LOW (ref 150–400)
RDW: 13.9 % (ref 11.5–15.5)
RDW: 15.1 % (ref 11.5–15.5)
RDW: 15.1 % (ref 11.5–15.5)
RDW: 15.1 % (ref 11.5–15.5)

## 2010-11-21 LAB — PREPARE FRESH FROZEN PLASMA: Unit division: 0

## 2010-11-21 LAB — BASIC METABOLIC PANEL
CO2: 24 mEq/L (ref 19–32)
CO2: 27 mEq/L (ref 19–32)
Calcium: 8 mg/dL — ABNORMAL LOW (ref 8.4–10.5)
Calcium: 7.9 mg/dL — ABNORMAL LOW (ref 8.4–10.5)
Creatinine, Ser: 0.76 mg/dL (ref 0.4–1.5)
Creatinine, Ser: 0.77 mg/dL (ref 0.4–1.5)
GFR calc Af Amer: >60 mL/min (ref >60)
GFR calc Af Amer: >60 mL/min (ref >60)
GFR calc non Af Amer: >60 mL/min (ref >60)
Glucose, Bld: 96 mg/dL (ref 70–99)

## 2010-11-21 LAB — POCT OCCULT BLOOD STOOL (DEVICE): Fecal Occult Bld: NEGATIVE

## 2010-11-21 LAB — PROTIME-INR
INR: 1.78 — ABNORMAL HIGH (ref 0.00–1.49)
INR: 1.68 — ABNORMAL HIGH (ref 0.00–1.49)
Prothrombin Time: 20.9 seconds — ABNORMAL HIGH (ref 11.6–15.2)

## 2010-11-21 MED ORDER — IOHEXOL 350 MG/ML SOLN
100.0000 mL | Freq: Once | INTRAVENOUS | Status: AC | PRN
  Administered 2010-11-21: 100 mL via INTRAVENOUS

## 2010-11-22 LAB — HEPARIN LEVEL (UNFRACTIONATED)
Heparin Unfractionated: 0.13 IU/mL — ABNORMAL LOW (ref 0.30–0.70)
Heparin Unfractionated: 0.15 IU/mL — ABNORMAL LOW (ref 0.30–0.70)
Heparin Unfractionated: 0.43 IU/mL (ref 0.30–0.70)

## 2010-11-22 LAB — BASIC METABOLIC PANEL
BUN: 15 mg/dL (ref 6–23)
Chloride: 105 mEq/L (ref 96–112)
Creatinine, Ser: 0.72 mg/dL (ref 0.4–1.5)
GFR calc Af Amer: >60 mL/min (ref >60)
GFR calc non Af Amer: >60 mL/min (ref >60)
Potassium: 3.5 mEq/L (ref 3.5–5.1)

## 2010-11-22 LAB — CBC
MCV: 89.5 fL (ref 78.0–100.0)
Platelets: 154 10*3/uL (ref 150–400)
RBC: 3.61 MIL/uL — ABNORMAL LOW (ref 4.22–5.81)
RDW: 14.8 % (ref 11.5–15.5)
WBC: 13 10*3/uL — ABNORMAL HIGH (ref 4.0–10.5)

## 2010-11-22 LAB — URINE CULTURE
Colony Count: NO GROWTH
Culture  Setup Time: 201206030043

## 2010-11-22 LAB — PROTIME-INR: Prothrombin Time: 21 seconds — ABNORMAL HIGH (ref 11.6–15.2)

## 2010-11-23 LAB — HEPARIN LEVEL (UNFRACTIONATED): Heparin Unfractionated: 0.38 IU/mL (ref 0.30–0.70)

## 2010-11-23 LAB — CROSSMATCH
Antibody Screen: NEGATIVE
Unit division: 0
Unit division: 0

## 2010-11-23 LAB — PROTIME-INR
INR: 1.6 — ABNORMAL HIGH (ref 0.00–1.49)
Prothrombin Time: 19.2 seconds — ABNORMAL HIGH (ref 11.6–15.2)

## 2010-11-23 LAB — CBC
MCHC: 34.4 g/dL (ref 30.0–36.0)
RDW: 14.6 % (ref 11.5–15.5)
WBC: 12.2 10*3/uL — ABNORMAL HIGH (ref 4.0–10.5)

## 2010-11-24 ENCOUNTER — Inpatient Hospital Stay (HOSPITAL_COMMUNITY): Payer: Medicare Other

## 2010-11-24 LAB — URINALYSIS, ROUTINE W REFLEX MICROSCOPIC
Ketones, ur: NEGATIVE mg/dL
Leukocytes, UA: NEGATIVE
Nitrite: NEGATIVE
pH: 7 (ref 5.0–8.0)

## 2010-11-24 LAB — HEPARIN LEVEL (UNFRACTIONATED): Heparin Unfractionated: 0.48 IU/mL (ref 0.30–0.70)

## 2010-11-24 LAB — COMPREHENSIVE METABOLIC PANEL
ALT: 32 U/L (ref 0–53)
BUN: 15 mg/dL (ref 6–23)
CO2: 27 mEq/L (ref 19–32)
Calcium: 7.9 mg/dL — ABNORMAL LOW (ref 8.4–10.5)
Creatinine, Ser: 0.75 mg/dL (ref 0.4–1.5)
GFR calc non Af Amer: >60 mL/min (ref >60)
Glucose, Bld: 130 mg/dL — ABNORMAL HIGH (ref 70–99)

## 2010-11-24 LAB — CBC
MCH: 30.5 pg (ref 26.0–34.0)
MCHC: 33.8 g/dL (ref 30.0–36.0)
Platelets: 199 10*3/uL (ref 150–400)
RDW: 14.6 % (ref 11.5–15.5)

## 2010-11-24 LAB — URINE MICROSCOPIC-ADD ON

## 2010-11-25 ENCOUNTER — Inpatient Hospital Stay (HOSPITAL_COMMUNITY): Payer: Medicare Other

## 2010-11-25 LAB — URINE CULTURE
Colony Count: NO GROWTH
Culture  Setup Time: 201206061858

## 2010-11-25 LAB — BASIC METABOLIC PANEL
CO2: 26 mEq/L (ref 19–32)
Chloride: 102 mEq/L (ref 96–112)
Creatinine, Ser: 0.67 mg/dL (ref 0.4–1.5)
GFR calc Af Amer: >60 mL/min (ref >60)
Sodium: 136 mEq/L (ref 135–145)

## 2010-11-25 LAB — HEMOGLOBIN A1C: Mean Plasma Glucose: 137 mg/dL — ABNORMAL HIGH (ref <117)

## 2010-11-25 LAB — CBC
Hemoglobin: 10.7 g/dL — ABNORMAL LOW (ref 13.0–17.0)
MCH: 30.7 pg (ref 26.0–34.0)
MCV: 90 fL (ref 78.0–100.0)
Platelets: 232 10*3/uL (ref 150–400)
RBC: 3.49 MIL/uL — ABNORMAL LOW (ref 4.22–5.81)

## 2010-11-25 LAB — HEPATIC FUNCTION PANEL
AST: 40 U/L — ABNORMAL HIGH (ref 0–37)
Albumin: 1.9 g/dL — ABNORMAL LOW (ref 3.5–5.2)
Alkaline Phosphatase: 75 U/L (ref 39–117)
Total Protein: 6 g/dL (ref 6.0–8.3)

## 2010-11-26 ENCOUNTER — Inpatient Hospital Stay (HOSPITAL_COMMUNITY): Payer: Medicare Other

## 2010-11-26 LAB — BASIC METABOLIC PANEL
BUN: 13 mg/dL (ref 6–23)
CO2: 25 mEq/L (ref 19–32)
Chloride: 103 mEq/L (ref 96–112)
Creatinine, Ser: 0.59 mg/dL (ref 0.4–1.5)

## 2010-11-26 LAB — CBC
MCH: 30.7 pg (ref 26.0–34.0)
MCV: 90.5 fL (ref 78.0–100.0)
Platelets: 272 10*3/uL (ref 150–400)
RDW: 14.3 % (ref 11.5–15.5)

## 2010-11-27 LAB — CBC
MCH: 31.1 pg (ref 26.0–34.0)
MCV: 90.6 fL (ref 78.0–100.0)
Platelets: 300 10*3/uL (ref 150–400)
RBC: 3.92 MIL/uL — ABNORMAL LOW (ref 4.22–5.81)
RDW: 14.4 % (ref 11.5–15.5)

## 2010-11-28 LAB — CBC
HCT: 32.2 % — ABNORMAL LOW (ref 39.0–52.0)
MCV: 90.2 fL (ref 78.0–100.0)
RDW: 14.4 % (ref 11.5–15.5)
WBC: 11.3 10*3/uL — ABNORMAL HIGH (ref 4.0–10.5)

## 2010-11-28 LAB — HEPARIN LEVEL (UNFRACTIONATED): Heparin Unfractionated: 0.58 IU/mL (ref 0.30–0.70)

## 2010-11-28 LAB — BASIC METABOLIC PANEL
CO2: 26 mEq/L (ref 19–32)
Chloride: 103 mEq/L (ref 96–112)
Creatinine, Ser: 0.69 mg/dL (ref 0.4–1.5)
Potassium: 3.9 mEq/L (ref 3.5–5.1)

## 2010-11-29 LAB — CBC
HCT: 32.9 % — ABNORMAL LOW (ref 39.0–52.0)
Hemoglobin: 11 g/dL — ABNORMAL LOW (ref 13.0–17.0)
MCV: 90.6 fL (ref 78.0–100.0)
RBC: 3.63 MIL/uL — ABNORMAL LOW (ref 4.22–5.81)
RDW: 14.3 % (ref 11.5–15.5)
WBC: 12.1 10*3/uL — ABNORMAL HIGH (ref 4.0–10.5)

## 2010-11-29 LAB — HEPARIN LEVEL (UNFRACTIONATED): Heparin Unfractionated: 0.49 IU/mL (ref 0.30–0.70)

## 2010-11-30 LAB — CBC
HCT: 32.3 % — ABNORMAL LOW (ref 39.0–52.0)
Hemoglobin: 10.7 g/dL — ABNORMAL LOW (ref 13.0–17.0)
RBC: 3.59 MIL/uL — ABNORMAL LOW (ref 4.22–5.81)
WBC: 11.1 10*3/uL — ABNORMAL HIGH (ref 4.0–10.5)

## 2010-11-30 LAB — PROTIME-INR
INR: 2.1 — ABNORMAL HIGH (ref 0.00–1.49)
Prothrombin Time: 23.7 seconds — ABNORMAL HIGH (ref 11.6–15.2)

## 2010-11-30 LAB — HEPARIN LEVEL (UNFRACTIONATED): Heparin Unfractionated: 0.45 IU/mL (ref 0.30–0.70)

## 2010-12-01 LAB — CBC
Hemoglobin: 10.9 g/dL — ABNORMAL LOW (ref 13.0–17.0)
Platelets: 300 10*3/uL (ref 150–400)
RBC: 3.64 MIL/uL — ABNORMAL LOW (ref 4.22–5.81)
WBC: 11.6 10*3/uL — ABNORMAL HIGH (ref 4.0–10.5)

## 2010-12-01 LAB — HEPARIN LEVEL (UNFRACTIONATED): Heparin Unfractionated: 0.48 IU/mL (ref 0.30–0.70)

## 2010-12-11 NOTE — Consult Note (Signed)
NAME:  Charles Terry, Charles Terry NO.:  [id number]  MEDICAL RECORD NO.:  [id number]           PATIENT TYPE:  I  LOCATION:  5158                         FACILITY:  [facility]  PHYSICIAN:  Danae Chen, M.D.  DATE OF BIRTH:  June 09, 1928  DATE OF CONSULTATION:  11/19/2010 DATE OF DISCHARGE:                                CONSULTATION   REASON FOR CONSULTATION:  Right hydronephrosis and distended bladder.  The patient is an 77 years old male.  He is a status post left inguinal hernia repair on Nov 12, 2010.  He was admitted on Nov 17, 2010, with swelling of the right lower extremity.  Doppler ultrasound of the lower extremity showed right DVT.  The patient's creatinine was found to be elevated at 2.5, and a week ago it was  0.96.  Renal ultrasound showed a bilateral renal cyst and moderate right hydronephrosis and a distended bladder.  A Foley catheter was then inserted in the bladder and it drained about 1100 mL.  The Foley catheter was left indwelling.  I was asked to see the patient for further evaluation and treatment.  The patient has been on Avodart.  He has a history of elevated PSA.  He states that he was voiding well on his own before this episode of urinary retention.  He is now on heparin for DVT.  The urine when the Foley catheter was first inserted was clear and it is now tea-colored, but in between apparently,  the urine was slightly bloody and according to his son the urine is clearing up.  He denies any abdominal pain.  He has a history of constipation.  He was given medication for constipation and he states that he has had good bowel movement since.  He had negative prostate biopsies for elevated PSA in May 2000, January 2003 and January 2005.  PAST MEDICAL HISTORY:  Positive for coronary artery disease, hypertension, hyperlipidemia.  PAST SURGICAL HISTORY:  He had coronary artery bypass surgery and abdominal aortic aneurysm repair and left inguinal hernia  repair.  MEDICATION:  Avodart, Lopressor, Micardis, Plavix and Zocor.  SOCIAL HISTORY:  He is married.  He does not smoke and drinks moderately.  FAMILY HISTORY:  He has 1 son, 1 daughter.  His father had a history of kidney stone and 1 brother had prostate cancer.  ALLERGIES:  He is allergic to ASPIRIN.  REVIEW OF SYSTEMS:  As noted in the HPI and everything else is negative.  PHYSICAL EXAMINATION:  GENERAL:  This is a well-developed 89 years of male who is currently in no acute distress.  He is alert and oriented to time, place and person. VITAL SIGNS:  His blood pressure is 124/63, pulse 66, respirations 18 and temperature 98.8. SKIN:  Warm and dry. ABDOMEN:  Protuberant, nontender.  He has well-healed surgical scars. Kidneys are not palpable.  He has no hepatomegaly or splenomegaly.  His bladder is not distended.  There is a healing incision in the left inguinal area.  There is no inguinal adenopathy. GENITOURINARY:  His penis is uncircumcised.  He has an indwelling Foley catheter that is draining tea-colored  urine.  The scrotum is normal. There is no testicular mass.  Cords and epididymis are within normal limits.  His BUN today is 28 and creatinine is 1.15.  IMPRESSION: 1. Urinary retention. 2. Benign prostatic hypertrophy. 3. History of elevated PSA. 4. Right hydronephrosis. 5. Bilateral renal cyst.  SUGGESTIONS:  Leave Foley catheter indwelling for now.  Repeat renal ultrasound for follow up of hydronephrosis.  If the hydronephrosis is resolved, the patient will be given a voiding trial.  Also, continue Avodart and start Flomax 0.4 mg daily.  We will follow the patient with you.     Danae Chen, M.D.     MN/MEDQ  D:  11/19/2010  T:  11/20/2010  Job:  [id number]  Electronically Signed by Lindaann Slough M.D. on 12/11/2010 01:55:40 PM

## 2010-12-20 NOTE — Discharge Summary (Signed)
NAME:  Charles Terry, Charles Terry NO.:  [id number]  MEDICAL RECORD NO.:  [id number]  LOCATION:  2922                         FACILITY:  [facility]  PHYSICIAN:  Marcellus Scott, MD     DATE OF BIRTH:  1927-09-29  DATE OF ADMISSION:  11/17/2010 DATE OF DISCHARGE:                        DISCHARGE SUMMARY - REFERRING   PRIMARY CARE PHYSICIAN:  Soyla Murphy. Renne Crigler, MD  SURGEON:  Adolph Pollack, MD  UROLOGIST:  Lindaann Slough, MD  DISCHARGE DIAGNOSES: 1. Postop acute extensive right lower extremity deep vein thrombosis. 2. Acute renal failure secondary to urinary retention and right     hydronephrosis, resolved. 3. Urinary retention from benign prostatic hypertrophy. 4. Large right renal cyst with hemorrhage into the cyst, stable. 5. Acute blood loss anemia secondary to hemorrhage into right renal     cyst. 6. Thrombocytopenia, unclear etiology.  Resolved. 7. Hypertension. 8. Recent left inguinal hernia repair. 9. Constipation. 10.Coronary artery disease, status post coronary artery bypass graft.     Plavix held until the anticoagulation completed.  DISCHARGE MEDICATIONS:  To be dictated by the discharging MD.  IMAGING: 1. CT angiogram of the abdomen on November 21, 2010.  Impression;    a. stable large complex and hemorrhagic right renal cyst mass without     interval enlargement or evidence of active bleeding by CT     angiogram.   b. Slight increase in perihepatic ascites.   c. Small pleural effusions and basilar atelectasis.   d. Diffuse atherosclerosis with mural thrombus and ectasia of the     lower thoracic aorta.   e. Cholelithiasis. 2. Ultrasound guidance for vascular access, IV catheterization, and     venogram.  Impression; Significant infrarenal inferior vena cava     narrowing related to compression by large hemorrhagic right renal     cyst.  IVC filter was not inserted. 3. CT of the abdomen and pelvis without contrast on June 2.     Impression; A large  complicated right renal cyst with thick wall     and mixed attenuation.  This could represent hemorrhage within the     preexisting right renal cyst, but the thick walls and soft tissue     component so worrisome for cystic neoplasm.  B.  Small right     pleural effusion.  C.  Small amount of ascites.  D.  Gallstones. 4. Renal ultrasound November 20, 2010.  Impression; Marked change in the     right renal cyst appearance.  Cyst is now very heterogeneous and     echogenic.  The findings suggest hemorrhage within the cyst.    No clear evidence for hydronephrosis, but limited evaluation of the     right kidney due to large right renal lesion.    Left renal cyst. 5. Right lower extremity venous Doppler on May 30.  Summary, findings     consistent with acute deep vein thrombosis involving the right     lower extremity which involved the right common femoral, right     femoral, right profunda femoral, right popliteal, right posterior     tibial and right peroneal veins.  There was no evidence of DVT in  the left lower extremity. 6. Abdominal x-ray on June 2.  Impression; A probable mild ileus.  No     definite obstruction or free air.  B. Density in the right abdomen,     most likely related to large right renal cyst noted previously. 7. Renal ultrasound Nov 18, 2010.  Impression; renal cysts.  There is     a large cyst on the right.  The cysts have been present since CT of     2005.    Moderate right hydronephrosis with hydroureter demonstrated     involving the proximal aspect of the right ureter.  No left     hydronephrosis evident.    Distention of the urinary bladder.  The patient was unable to void.  LABORATORY DATA:  INR 1.6, heparin level 0.38.  CBC, hemoglobin 10.6, hematocrit 31, white blood cell 12.2, and platelets 179.  Urine culture negative.  Basic metabolic panel yesterday unremarkable except for glucose of 113.  BUN 15, creatinine 0.72.  Stool occult blood was negative.  TSH  was 2.087.  Urinalysis had shown 11-20 white blood cells, 7-10 red blood cells and few bacteria.  CONSULTATIONS: 1. General Surgery, Dr. Avel Peace. 2. Urology, Dr. Su Grand and Dr. Jethro Bolus. 3. Interventional Radiology, Dr. Ruel Favors.  DIET:  Heart-healthy diet.  ACTIVITY:  Ad lib.  COMPLAINTS TODAY:  Constipation.  The patient has tolerated his regular diet.  He is passing flatus.  He has intermittent right upper and mid abdominal mild pain.  There is no nausea or vomiting.  No bleeding.  His Foley catheter was taken out 2-3 hours ago and the patient is yet to void.  PHYSICAL EXAMINATION:  GENERAL:  The patient is comfortably sitting on a chair in no obvious distress. Telemetry shows mostly sinus rhythm in the 70s with occasional PVCs. VITAL SIGNS:  Temperature 98.5 degrees Fahrenheit, pulse 69 per minute, blood pressure is 120/57, respirations 18 per minute, and saturating at 94% on room air. RESPIRATORY SYSTEM:  Clear. CARDIOVASCULAR SYSTEM:  First and second heart sounds heard and regular. No JVD. ABDOMEN:  Obese, nontender, soft, and bowel sounds present normally. CENTRAL NERVOUS SYSTEM:  The patient is awake, alert, oriented x3 with no focal neurological deficits.  HOSPITAL COURSE:  Mr. Kneale is a pleasant Caucasian male patient with history of left inguinal hernia repair on Nov 13, 2010, hypertension, coronary artery disease, status post coronary artery bypass graft, benign prostatic hypertension, hyperlipidemia, abdominal aortic aneurysm repair.  Postoperatively, the patient had constipation, poor oral intake, and had been using some MiraLax and bowel regimens.  He also noticed swelling in the right lower extremity.  He was seen by his primary care physician and Dopplers of the right lower extremity revealed deep vein thrombosis and the patient was admitted to the hospital for further evaluation.  He also had increase in his creatinine to 2.5  which was new. 1. Extensive right lower extremity deep vein thrombosis.  The patient     was admitted to the hospital.  He was started on IV heparin drip     and Coumadin per pharmacy.  However, 2 days into the admission the     patient started complaining of some abdominal pain.  His ultrasound     which was done for evaluation of his renal failure showed a large     right renal cyst with possible hemorrhage in it.  This was followed     by the CT which confirmed the same.  In the interim, the patient     dropped his hemoglobin significantly suggesting acute blood loss     hemorrhage.  All his anticoagulants were discontinued.  He was     transferred to the Step-Down Unit.  Interventional Radiology was     consulted for IVC filter placement.  However, when they did the     venogram prior to the filter placement, they determined that the     large right renal cyst was actually compressing on the IVC,     narrowing the IVC and reducing flow.  They indicated that placing     an IVC filter would increase the risk of clotting in the IVC filter     and hence advised against it.  In the interim, the patient was     transfused packed red blood cells and his hemoglobin and hematocrit     stabilized and there was no further clinical indication of bleeding     into the right renal cyst.  His heparin was resumed and then his     Coumadin was resumed.  At this time, the INR is not therapeutic     yet.  He will need at least 3 months of Coumadin therapy for     postoperative DVT.  His Plavix has been discontinued secondary to     the increased bleeding risk at least until his Coumadin therapy is     completed.  This was discussed with his primary cardiologist.  They     recommended a baby aspirin, but the patient is allergic to ASPIRIN     and hence he is not on any antiplatelet therapy. 2. Acute renal failure secondary to urinary retention from benign     prostatic hypertrophy and associated right  hydronephrosis.  This     has resolved with the Foley catheter.  Foley catheter was removed     this morning and he will be monitored for voiding. 3. Acute blood loss anemia secondary to bleeding into the right large     renal cyst.  He was transfused 2 units of packed red blood cells     and a unit of FFP.  His hemoglobin is stable and will need to be     closely monitored. 4. Large right renal cyst with hemorrhage into it.  The patient has     had a large cyst even on CT imaging in 2005, but this seems to have     increased in size since then.  Urology were consulted and they     managed his urinary retention as well as the renal cyst.  They     indicated conservative management at this time.  They have cleared     him to start his anticoagulation therapy and recommended an MRI of     the abdomen to evaluate this cyst further in about 3 months. 5. Hypertension.  Reasonably controlled. 6. Constipation.  The patient will be on a bowel regimen. 7. Coronary artery disease, status post coronary artery bypass graft.     The patient has been asymptomatic.  As indicated above, his case     was discussed with the [facility].  They indicated that the patient has not had a cardiac stent     and hence his Plavix could be discontinued.  They recommended baby     aspirin, but the patient is allergic to ASPIRIN and hence the  patient is not on any antiplatelet therapy and they are aware of     this. 8. Recent left inguinal hernia surgery.  The surgical site looks     clean, dry and intact. 9. Thrombocytopenia of unclear etiology.  Resolved.  DISPOSITION:  At this time, we are awaiting the patient's INR to be therapeutic prior to discontinuing his heparin and sending him home which may take another day or two.  FOLLOWUP RECOMMENDATIONS: 1. The patient will need an appointment with his primary care     physician for close monitoring of his INR for  Coumadin dose     adjustments. 2. Follow up with Dr. Merri Brunette in 3-5 days from discharge. 3. Follow up with Dr. Su Grand of Urology.  The patient is to call     for an appointment. 4. Follow up with Dr. Avel Peace, again the patient is to call     for an appointment.     Marcellus Scott, MD     AH/MEDQ  D:  11/23/2010  T:  11/23/2010  Job:  [id number]  cc:   Soyla Murphy. Renne Crigler, M.D. Adolph Pollack, M.D. Lindaann Slough, M.D. Landry Corporal, MD  Electronically Signed by Marcellus Scott MD on 12/20/2010 10:48:02 PM

## 2011-01-05 NOTE — Discharge Summary (Signed)
NAME:  Charles Terry, Charles Terry NO.:  [id number]  MEDICAL RECORD NO.:  [id number]  LOCATION:  5526                         FACILITY:  [facility]  PHYSICIAN:  Thad Ranger, MD       DATE OF BIRTH:  [birth year]  DATE OF ADMISSION:  11/17/2010 DATE OF DISCHARGE:  12/01/2010                              DISCHARGE SUMMARY   PRIMARY CARE PHYSICIAN:  Soyla Murphy. Renne Crigler, MD  UROLOGISTLynelle Smoke I. Patsi Sears, MD  DISCHARGE DIAGNOSES: 1. Hematuria complicated by need for Coumadin therapy. 2. Acute extensive right lower extremity deep vein thrombosis, postop. 3. Acute renal failure secondary to urinary retention, resolved. 4. Urinary retention secondary to benign prostatic hypertrophy. 5. Large right hemorrhagic renal cyst, stable. 6. Acute blood loss anemia secondary to above. 7. Thrombocytopenia, resolved. 8. Constipation, resolved. 9. Scrotal edema, resolved. 10.History of hypertension. 11.Recent left inguinal hernia repair on Nov 13, 2010. 12.History of coronary artery disease.  CONSULTATIONS DURING HOSPITALIZATION: 1. General Surgery, Dr. Avel Peace. 2. Urology, Dr. Lindaann Slough and Dr. Jethro Bolus. 3. Interventional Radiology.  BRIEF HISTORY:  Please see recent discharge summary done on November 23, 2010, for complete details.  In short, Charles Terry is an 77 year old male, who was actually admitted on Nov 18, 2010, with complaints of right lower extremity swelling and Doppler study showing extensive right lower extremity DVT.  Subsequent findings in the ER showed that the patient was in acute renal failure with creatinine of 2.5, prompting admission for further evaluation and treatment.  The patient's hospitalization has been complicated by finding of large hemorrhagic renal cyst as well as BPH causing urinary retention and subsequent hematuria from catheter placement.  The patient's medical problems appear to be stable at this point and the patient is likely to  be discharged on December 01, 2010.  COURSE OF HOSPITALIZATION: 1. Hematuria status post Foley catheter insertion.  Again, the patient     experienced some urinary retention in the setting of BPH.     Placement of Foley catheter caused some trauma leading to hematuria.     At this point, the patient continues to have some dark maroon     drainage from Foley catheter.  Urology has seen the patient in     consultation, who have recommended continuing the patient on both     Avodart and Flomax, the Flomax which is new since admission.  The     patient will also be discharged home with continued indwelling     Foley catheter with close followup with Urology to be scheduled     prior to disposition.  The patient's hemoglobin has remained     stable.  Despite hematuria, the patient will need to be continued     on his Coumadin therapy at discharge for acute DVT.  Please see     below for further details. 2. Acute right lower extremity DVT.  Coumadin with heparin drip were     indicated with diagnosis of DVT.  However, after the patient     complained of some right lower quadrant abdominal pain, a CT of the     abdomen was performed showing fairly large right hemorrhagic renal  cyst.  Because of this hemorrhagic cyst and concern for keeping the     patient on Coumadin, Interventional Radiology was asked to see the     patient in consultation for placement of IVC filter.  Upon     attempting filter placement, the patient's cyst found to be causing     compression of the IVC, thereby prohibiting placement of filter.     At this point, the patient will need to be continued on Coumadin.     As mentioned above, hemoglobin has remained stable.  The patient     will need close followup with primary care physician for monitoring     INR and Coumadin dose. 3. Ileus.  The patient's constipation improved with bowel regimen of     stool softeners and laxatives.  DISCHARGE MEDICATIONS: 1. Avodart 0.5 mg  p.o. daily. 2. Micardis 40 mg p.o. daily. 3. Fish oil 1 capsule p.o. b.i.d. 4. Metoprolol tartrate 25 mg p.o. b.i.d. 5. MiraLax 17 g p.o. daily p.r.n. constipation. 6. Simvastatin 20 mg p.o. daily. 7. Multivitamin p.o. daily. 8. Coumadin 5 mg daily unless otherwise directed to keep INR for goal     2-3.  LABORATORY DATA:  White cell count 11.1, platelet count 273, hemoglobin 12.7, hematocrit 32.3.  Sodium 135, potassium 3.9, BUN 13, creatinine 0.69, INR 2.10.  DISPOSITION:  The patient's INR is currently therapeutic, however, he will need 24 hours more of heparin drip for bridge.  If all remain stable, the patient is ready for discharge on December 01, 2010.  Prior to discharge, we will arrange follow up with Dr. Patsi Sears at [facility] later this week, as the patient will be discharged home with indwelling Foley catheter.  In addition, we will schedule follow up with Dr. Renne Crigler to monitor the patient's INR.   Greater than 30 minutes spent on discharge planning.     Cordelia Pen, NP   ______________________________ Thad Ranger, MD    LE/MEDQ  D:  11/30/2010  T:  11/30/2010  Job:  [id number] cc:   Soyla Murphy. Renne Crigler, M.D. Sigmund I. Patsi Sears, M.D. Adolph Pollack, M.D.  Electronically Signed by Cordelia Pen NP on 12/24/2010 01:47:24 PM Electronically Signed by Andres Labrum RAI  on 01/05/2011 06:09:17 PM

## 2011-01-17 NOTE — Discharge Summary (Signed)
  NAME:  Charles Terry, MANGIARACINA NO.:  [id number]  MEDICAL RECORD NO.:  [id number]  LOCATION:                                 FACILITY:  PHYSICIAN:  Adolph Pollack, M.D.DATE OF BIRTH:  Jan 02, 1928  DATE OF ADMISSION: DATE OF DISCHARGE:                              DISCHARGE SUMMARY   FINAL DIAGNOSIS:  Left inguinal hernia.  HOSPITAL COURSE:  The patient was admitted for outpatient left inguinal hernia repair which he underwent and he was discharged home from a Short Stay Center in satisfactory condition with no change in medications. he was given discharge instructions, and medication for pain.     Adolph Pollack, M.D.     Kari Baars  D:  01/12/2011  T:  01/12/2011  Job:  [id number]  Electronically Signed by Avel Peace M.D. on 01/17/2011 09:30:40 AM

## 2011-04-04 HISTORY — PX: OTHER SURGICAL HISTORY: SHX169

## 2011-04-21 ENCOUNTER — Other Ambulatory Visit: Payer: Self-pay | Admitting: Urology

## 2011-04-21 DIAGNOSIS — N401 Enlarged prostate with lower urinary tract symptoms: Secondary | ICD-10-CM

## 2011-04-21 DIAGNOSIS — N138 Other obstructive and reflux uropathy: Secondary | ICD-10-CM

## 2011-04-21 DIAGNOSIS — R339 Retention of urine, unspecified: Secondary | ICD-10-CM

## 2011-04-21 HISTORY — PX: OTHER SURGICAL HISTORY: SHX169

## 2011-04-21 HISTORY — DX: Benign prostatic hyperplasia with lower urinary tract symptoms: N13.8

## 2011-04-21 HISTORY — DX: Other obstructive and reflux uropathy: N40.1

## 2011-04-24 ENCOUNTER — Encounter (HOSPITAL_COMMUNITY): Payer: Self-pay | Admitting: *Deleted

## 2011-04-24 ENCOUNTER — Emergency Department (HOSPITAL_COMMUNITY)
Admission: EM | Admit: 2011-04-24 | Discharge: 2011-04-24 | Disposition: A | Discharge status: Home / Self Care | Payer: Medicare Other | Attending: Emergency Medicine | Admitting: Emergency Medicine

## 2011-04-24 DIAGNOSIS — T83091A Other mechanical complication of indwelling urethral catheter, initial encounter: Secondary | ICD-10-CM | POA: Insufficient documentation

## 2011-04-24 DIAGNOSIS — N390 Urinary tract infection, site not specified: Secondary | ICD-10-CM | POA: Insufficient documentation

## 2011-04-24 DIAGNOSIS — R339 Retention of urine, unspecified: Secondary | ICD-10-CM | POA: Insufficient documentation

## 2011-04-24 DIAGNOSIS — T83011A Breakdown (mechanical) of indwelling urethral catheter, initial encounter: Secondary | ICD-10-CM

## 2011-04-24 DIAGNOSIS — Y846 Urinary catheterization as the cause of abnormal reaction of the patient, or of later complication, without mention of misadventure at the time of the procedure: Secondary | ICD-10-CM | POA: Insufficient documentation

## 2011-04-24 HISTORY — DX: Personal history of other venous thrombosis and embolism: Z86.718

## 2011-04-24 HISTORY — DX: Other specified postprocedural states: Z98.890

## 2011-04-24 LAB — URINALYSIS, ROUTINE W REFLEX MICROSCOPIC
Bilirubin Urine: NEGATIVE
Hgb urine dipstick: NEGATIVE
Nitrite: NEGATIVE
Specific Gravity, Urine: 1.01 (ref 1.005–1.030)
pH: 6.5 (ref 5.0–8.0)

## 2011-04-24 LAB — POCT I-STAT, CHEM 8
Chloride: 104 mEq/L (ref 96–112)
Glucose, Bld: 121 mg/dL — ABNORMAL HIGH (ref 70–99)
HCT: 44 % (ref 39.0–52.0)
Potassium: 4.1 mEq/L (ref 3.5–5.1)
Sodium: 139 mEq/L (ref 135–145)

## 2011-04-24 LAB — CBC
Hemoglobin: 14 g/dL (ref 13.0–17.0)
MCH: 30 pg (ref 26.0–34.0)
RBC: 4.67 MIL/uL (ref 4.22–5.81)
WBC: 6.9 10*3/uL (ref 4.0–10.5)

## 2011-04-24 LAB — URINE MICROSCOPIC-ADD ON

## 2011-04-24 LAB — PROTIME-INR: Prothrombin Time: 22.1 seconds — ABNORMAL HIGH (ref 11.6–15.2)

## 2011-04-24 MED ORDER — CEPHALEXIN 500 MG PO CAPS: 500.0000 mg | ORAL_CAPSULE | Freq: Four times a day (QID) | ORAL | Status: AC

## 2011-04-24 MED ORDER — CEPHALEXIN 500 MG PO CAPS
500.0000 mg | ORAL_CAPSULE | Freq: Once | ORAL | Status: AC
  Administered 2011-04-24: 08:00:00 via ORAL

## 2011-04-24 MED ORDER — CEPHALEXIN 500 MG PO CAPS: 500.0000 mg | ORAL_CAPSULE | Freq: Four times a day (QID) | ORAL | Status: DC

## 2011-04-24 MED ORDER — CEPHALEXIN 500 MG PO CAPS
ORAL_CAPSULE | ORAL | Status: AC
  Filled 2011-04-24: qty 1

## 2011-04-24 NOTE — ED Notes (Signed)
MD at bedside. 

## 2011-04-24 NOTE — ED Provider Notes (Signed)
History     CSN: [id number] Arrival date & time: 04/24/2011  5:23 AM   First MD Initiated Contact with Patient 04/24/11 [phone]      Chief Complaint  Patient presents with  . Urinary Retention    catheter not draining    (Consider location/radiation/quality/duration/timing/severity/associated sxs/prior treatment) HPI Comments: 77 year old male with a history of indwelling Foley catheter. His was placed in May and patient has had since that time without difficulty. He is unsure of the last date then it was changed. He states that approximately 12 hours ago the Foley catheter stopped draining but has had some leakage around the catheter site. He denies abdominal pain, fever, chills, nausea, vomiting. Symptoms are constant, nothing makes better or worse.  The history is provided by the patient, the spouse and medical records.    Past Medical History  Diagnosis Date  . Enlarged prostate   . History of AAA (abdominal aortic aneurysm) repair   . History of blood clots     Past Surgical History  Procedure Date  . Cardiac surgery   . Hernia repair     No family history on file.  History  Substance Use Topics  . Smoking status: Never Smoker   . Smokeless tobacco: Not on file  . Alcohol Use: No      Review of Systems  All other systems reviewed and are negative.    Allergies  Aspirin  Home Medications   Current Outpatient Rx  Name Route Sig Dispense Refill  . AVODART PO Oral Take by mouth.     [name] SIMVASTATIN PO Oral Take by mouth.      [name] FLOMAX PO Oral Take by mouth.      . COUMADIN PO Oral Take by mouth.      . CEPHALEXIN 500 MG PO CAPS Oral Take 1 capsule (500 mg total) by mouth 4 (four) times daily. 40 capsule 0    BP 157/78  Pulse 62  Temp(Src) 97.7 F (36.5 C) (Oral)  Resp 16  SpO2 99%  Physical Exam  Nursing note and vitals reviewed. Constitutional: He appears well-developed and well-nourished. No distress.  HENT:  Head: Normocephalic and atraumatic.    Mouth/Throat: Oropharynx is clear and moist. No oropharyngeal exudate.  Eyes: Conjunctivae and EOM are normal. Pupils are equal, round, and reactive to light. Right eye exhibits no discharge. Left eye exhibits no discharge. No scleral icterus.  Neck: Normal range of motion. Neck supple. No JVD present. No thyromegaly present.  Cardiovascular: Normal rate, regular rhythm, normal heart sounds and intact distal pulses.  Exam reveals no gallop and no friction rub.   No murmur heard. Pulmonary/Chest: Effort normal and breath sounds normal. No respiratory distress. He has no wheezes. He has no rales.  Abdominal: Soft. Bowel sounds are normal. He exhibits no distension and no mass. There is no tenderness.  Musculoskeletal: Normal range of motion. He exhibits no edema and no tenderness.  Lymphadenopathy:    He has no cervical adenopathy.  Neurological: He is alert. Coordination normal.  Skin: Skin is warm and dry. No rash noted. No erythema.  Psychiatric: He has a normal mood and affect. His behavior is normal.    ED Course  Procedures (including critical care time)  Labs Reviewed  URINALYSIS, ROUTINE W REFLEX MICROSCOPIC - Abnormal; Notable for the following:    Appearance CLOUDY (*)    Leukocytes, UA LARGE (*)    All other components within normal limits  URINE MICROSCOPIC-ADD ON - Abnormal; Notable  for the following:    Bacteria, UA MANY (*)    Crystals CA OXALATE CRYSTALS (*)    All other components within normal limits  URINE CULTURE   No results found.   1. Malfunction of indwelling urinary catheter   2. Urinary tract infection       MDM  Foley catheter in place, vital signs without fever or tachycardia, will check for urinary infection, attempt to flush the catheter, replace if unsuccessful.  Results for orders placed during the hospital encounter of 04/24/11  URINALYSIS, ROUTINE W REFLEX MICROSCOPIC      Component Value Range   Color, Urine YELLOW  YELLOW    Appearance  CLOUDY (*) CLEAR    Specific Gravity, Urine 1.010  1.005 - 1.030    pH 6.5  5.0 - 8.0    Glucose, UA NEGATIVE  NEGATIVE (mg/dL)   Hgb urine dipstick NEGATIVE  NEGATIVE    Bilirubin Urine NEGATIVE  NEGATIVE    Ketones, ur NEGATIVE  NEGATIVE (mg/dL)   Protein, ur NEGATIVE  NEGATIVE (mg/dL)   Urobilinogen, UA 0.2  0.0 - 1.0 (mg/dL)   Nitrite NEGATIVE  NEGATIVE    Leukocytes, UA LARGE (*) NEGATIVE   URINE MICROSCOPIC-ADD ON      Component Value Range   WBC, UA 11-20  <3 (WBC/hpf)   Bacteria, UA MANY (*) RARE    Crystals CA OXALATE CRYSTALS (*) NEGATIVE    No results found.  Urinalysis reveals signs of infection. We'll treat with antibiotics and have followup with urologist.        Vida Roller, MD 04/24/11 [phone]

## 2011-04-24 NOTE — ED Notes (Signed)
Pt in stating his catheter has been clogged since yesterday, states urine flow into bag has stopped, urine has been leaking around catheter, denies pain, states he is able to empty his bladder

## 2011-04-24 NOTE — ED Notes (Signed)
Pt foley irrigated 10 cc normal saline.  Pt tolerated well.  Pt had minimal urine reurn.  Will continue to monitor.  Pt denies urge to void at this time

## 2011-04-26 ENCOUNTER — Encounter (HOSPITAL_COMMUNITY): Payer: Self-pay | Admitting: Pharmacy Technician

## 2011-04-27 LAB — URINE CULTURE: Colony Count: >=100000

## 2011-04-28 NOTE — ED Notes (Signed)
+   Urine Patient treated with Keflex-sensitive to same-chart appended per protocol MD. 

## 2011-05-02 ENCOUNTER — Ambulatory Visit (HOSPITAL_COMMUNITY)
Admission: RE | Admit: 2011-05-02 | Discharge: 2011-05-02 | Disposition: A | Discharge status: Home / Self Care | Payer: Medicare Other | Source: Ambulatory Visit | Attending: Urology | Admitting: Urology

## 2011-05-02 ENCOUNTER — Encounter (HOSPITAL_COMMUNITY): Payer: Self-pay

## 2011-05-02 ENCOUNTER — Encounter (HOSPITAL_COMMUNITY): Payer: Medicare Other

## 2011-05-02 DIAGNOSIS — Z01818 Encounter for other preprocedural examination: Secondary | ICD-10-CM | POA: Insufficient documentation

## 2011-05-02 DIAGNOSIS — Z01812 Encounter for preprocedural laboratory examination: Secondary | ICD-10-CM | POA: Insufficient documentation

## 2011-05-02 LAB — BASIC METABOLIC PANEL
CO2: 29 mEq/L (ref 19–32)
Calcium: 9.6 mg/dL (ref 8.4–10.5)
Creatinine, Ser: 0.84 mg/dL (ref 0.50–1.35)
GFR calc non Af Amer: 81 mL/min — ABNORMAL LOW (ref >90)
Glucose, Bld: 107 mg/dL — ABNORMAL HIGH (ref 70–99)
Sodium: 137 mEq/L (ref 135–145)

## 2011-05-02 LAB — PROTIME-INR: INR: 1.2 (ref 0.00–1.49)

## 2011-05-02 LAB — CBC
Hemoglobin: 14.7 g/dL (ref 13.0–17.0)
MCH: 30.1 pg (ref 26.0–34.0)
MCHC: 33.5 g/dL (ref 30.0–36.0)
MCV: 90 fL (ref 78.0–100.0)

## 2011-05-02 LAB — SURGICAL PCR SCREEN
MRSA, PCR: NEGATIVE
Staphylococcus aureus: POSITIVE — AB

## 2011-05-02 NOTE — Patient Instructions (Signed)
20 JAHLIL ZILLER  05/02/2011   Your procedure is scheduled on:  Monday 11/19 AT 8:30 AM  Report to [facility] at  6:30 AM.  Call this number if you have problems the morning of surgery: [phone]   Remember:   Do not eat OR DRINK ANYTHING AFTER MIDNIGHT - THE NIGHT BEFORE YOUR SURGERY.  NO FOOD - NO WATER, NO CHEWING GUM, NOTHING..  .  Take these medicines the morning of surgery with A SIP OF WATER: METOPROLOL   Do not wear jewelry, make-up or nail polish.  Do not wear lotions, powders, or perfumes. You may wear deodorant.  Do not shave 48 hours prior to surgery.  Do not bring valuables to the hospital.  Contacts, dentures or bridgework may not be worn into surgery.  Leave suitcase in the car. After surgery it may be brought to your room.  For patients admitted to the hospital, checkout time is 11:00 AM the day of discharge.   Patients discharged the day of surgery will not be allowed to drive home.  Name and phone number of your driver:   Special Instructions: CHG Shower Use Special Wash: 1/2 bottle night before surgery and 1/2 bottle morning of surgery.   Please read over the following fact sheets that you were given: MRSA Information AND INCENTIVE SPIROMETRY

## 2011-05-02 NOTE — Pre-Procedure Instructions (Addendum)
Cardiology office notes 03/21/11 with clearance for prostate surgery with dr. Patsi Sears --on this chart from dr. Herbie Baltimore with [facility] heart & vascular.  ekg report from that visit also requested.  There is a previous ekg report5/30/12 from [facility]--on this chart. cxr was done today at [facility] with pre op labs-result pending. EKG REPORT 03/21/11 ON CHART FROM DR. HARDING

## 2011-05-04 ENCOUNTER — Other Ambulatory Visit: Payer: Self-pay | Admitting: Physician Assistant

## 2011-05-04 MED ORDER — SODIUM CHLORIDE 0.9 % IV SOLN: INTRAVENOUS | Status: DC

## 2011-05-05 ENCOUNTER — Encounter (HOSPITAL_COMMUNITY): Payer: Self-pay

## 2011-05-05 ENCOUNTER — Ambulatory Visit (HOSPITAL_COMMUNITY)
Admission: RE | Admit: 2011-05-05 | Discharge: 2011-05-05 | Disposition: A | Discharge status: Home / Self Care | Payer: Medicare Other | Source: Ambulatory Visit | Attending: Urology | Admitting: Urology

## 2011-05-05 ENCOUNTER — Inpatient Hospital Stay (HOSPITAL_COMMUNITY): Admission: RE | Admit: 2011-05-05 | Payer: Medicare Other | Source: Ambulatory Visit

## 2011-05-05 ENCOUNTER — Ambulatory Visit (HOSPITAL_COMMUNITY): Admission: RE | Admit: 2011-05-05 | Payer: Medicare Other | Source: Ambulatory Visit

## 2011-05-05 VITALS — BP 133/73 | HR 58 | Temp 97.4°F | Resp 12

## 2011-05-05 DIAGNOSIS — I2510 Atherosclerotic heart disease of native coronary artery without angina pectoris: Secondary | ICD-10-CM | POA: Insufficient documentation

## 2011-05-05 DIAGNOSIS — Z95828 Presence of other vascular implants and grafts: Secondary | ICD-10-CM

## 2011-05-05 DIAGNOSIS — N138 Other obstructive and reflux uropathy: Secondary | ICD-10-CM | POA: Insufficient documentation

## 2011-05-05 DIAGNOSIS — Z79899 Other long term (current) drug therapy: Secondary | ICD-10-CM | POA: Insufficient documentation

## 2011-05-05 DIAGNOSIS — R339 Retention of urine, unspecified: Secondary | ICD-10-CM

## 2011-05-05 DIAGNOSIS — Z01818 Encounter for other preprocedural examination: Secondary | ICD-10-CM | POA: Insufficient documentation

## 2011-05-05 DIAGNOSIS — N401 Enlarged prostate with lower urinary tract symptoms: Secondary | ICD-10-CM | POA: Insufficient documentation

## 2011-05-05 DIAGNOSIS — I10 Essential (primary) hypertension: Secondary | ICD-10-CM | POA: Insufficient documentation

## 2011-05-05 DIAGNOSIS — Z86718 Personal history of other venous thrombosis and embolism: Secondary | ICD-10-CM | POA: Insufficient documentation

## 2011-05-05 HISTORY — DX: Presence of other vascular implants and grafts: Z95.828

## 2011-05-05 MED ORDER — LIDOCAINE-EPINEPHRINE 2 %-1:100000 IJ SOLN
INTRAMUSCULAR | Status: AC
  Filled 2011-05-05: qty 1

## 2011-05-05 MED ORDER — IOHEXOL 300 MG/ML  SOLN: 40.0000 mL | Freq: Once | INTRAMUSCULAR | Status: AC | PRN

## 2011-05-05 MED ORDER — MIDAZOLAM HCL 5 MG/5ML IJ SOLN
INTRAMUSCULAR | Status: DC | PRN
  Administered 2011-05-05: 2 mg via INTRAVENOUS

## 2011-05-05 MED ORDER — FENTANYL CITRATE 0.05 MG/ML IJ SOLN
INTRAMUSCULAR | Status: DC | PRN
  Administered 2011-05-05: 100 ug via INTRAVENOUS

## 2011-05-05 NOTE — ED Notes (Signed)
GAUZE BANDAGE APPLIED TO R IJ PUNCTURE SITE.  SITE UNREMARKABLE, DRAG CDI.

## 2011-05-05 NOTE — H&P (Signed)
Charles Terry is an 77 y.o. male.   Chief Complaint: '' I'm here for a filter" HPI: Patient with history of right lower extremity DVT, nontraumatic retroperitoneal hematoma/ hemorrhagic right renal cyst presents today for placement of IVC filter prior to planned TURP on 05/09/11.  Past Medical History  Diagnosis Date  . Enlarged prostate   . History of AAA (abdominal aortic aneurysm) repair   . History of blood clots     right lower extremity dvt  after inguinal hernia surgery may 2012--doppler follow up report 04/07/11 at [facility] heart and vaxcular shows resolution of the dvt--but pt is to have IVC filter placed 05/05/11--prior to planned prostate surgery scheduled for 05/09/11.  . Coronary artery disease   . Hypertension   . Blood transfusion MAY 2012  . Renal cyst may 2012    bleeding from right renal cyst /acute renal failure secondary to urinary retention from bph --pt states  the cyst is no longer a problem--pt still has indwelling foley catheter--plans  open  suprapubic prostatectomy on 11/19 with dr. Patsi Sears    Past Surgical History  Procedure Date  . Cardiac surgery 2005  . Aaa rpair 2006  . Hernia repair     MAY 2012    No family history on file. Social History:  reports that he has quit smoking. He has never used smokeless tobacco. He reports that he drinks alcohol. He reports that he does not use illicit drugs.  Allergies:  Allergies  Allergen Reactions  . Aspirin Other (See Comments)    Unknown   . Food Swelling    KIWI FRUIT. THROAT SWELLING    Medications Prior to Admission  Medication Sig Dispense Refill  . cephALEXin (KEFLEX) 500 MG capsule Take 1 capsule (500 mg total) by mouth 4 (four) times daily.  40 capsule  0  . cephALEXin (KEFLEX) 500 MG capsule Take 1 capsule (500 mg total) by mouth 4 (four) times daily.  40 capsule  0  . Dutasteride (AVODART PO) Take 0.5 mg by mouth daily.       . ferrous gluconate (FERGON) 325 MG tablet Take 325 mg by  mouth 2 (two) times daily.       . metoprolol tartrate (LOPRESSOR) 25 MG tablet Take 12.5 mg by mouth 2 (two) times daily.       . Multiple Vitamins-Minerals (MULTIVITAMINS THER. W/MINERALS) TABS Take 1 tablet by mouth daily.       . simvastatin (ZOCOR) 40 MG tablet Take 20 mg by mouth at bedtime.       . Tamsulosin HCl (FLOMAX PO) Take 0.4 mg by mouth daily.       . Warfarin Sodium (COUMADIN PO) Take 5-7.5 mg by mouth daily. 5 MG ONE DAY THEN 7.5 MG THE NEXT DAY       Medications Prior to Admission  Medication Dose Route Frequency Provider Last Rate Last Dose  . DISCONTD: 0.9 %  sodium chloride infusion   Intravenous Continuous Anselm Pancoast, PA        VITALS: BP 155/70  TEMP 97.4   HR 57   O2 SATS 95% RA Review of Systems  Constitutional: Negative for fever and chills.  Respiratory: Negative for cough and shortness of breath.   Cardiovascular: Negative for chest pain.  Gastrointestinal: Negative for nausea, vomiting and abdominal pain.  Genitourinary:       Chronic urinary retention with foley cath  Neurological: Negative for headaches.  Endo/Heme/Allergies: Does not bruise/bleed easily.  There were no vitals taken for this visit. Physical Exam  Constitutional: He is oriented to person, place, and time. He appears well-developed and well-nourished.  Cardiovascular: Normal rate and regular rhythm.   Respiratory: Effort normal.       Mildly diminished left basilar breath sounds  GI: Soft. Bowel sounds are normal.  Musculoskeletal: Normal range of motion.  Neurological: He is alert and oriented to person, place, and time.  Psychiatric: He has a normal mood and affect.     Assessment/Plan Patient with history of right leg DVT, prior retroperitoneal hematoma/ right hemorrhagic renal cyst and BPH; plan is for placement of IVC filter prior to planned TURP.  Kinzy Weyers,D KEVIN 05/05/2011, 1:49 PM

## 2011-05-05 NOTE — ED Notes (Signed)
MD at bedside. 

## 2011-05-05 NOTE — Procedures (Signed)
Successful placement of R jugular approach retrievable IVC filter below the level of the renal veins.  No immediate complications.

## 2011-05-05 NOTE — ED Notes (Signed)
INCISION TIME 

## 2011-05-09 ENCOUNTER — Encounter (HOSPITAL_COMMUNITY)
Admission: RE | Disposition: A | Discharge status: Home / Self Care | Payer: Self-pay | Source: Ambulatory Visit | Attending: Urology

## 2011-05-09 ENCOUNTER — Encounter (HOSPITAL_COMMUNITY): Payer: Self-pay | Admitting: Anesthesiology

## 2011-05-09 ENCOUNTER — Inpatient Hospital Stay (HOSPITAL_COMMUNITY): Payer: Medicare Other | Admitting: Anesthesiology

## 2011-05-09 ENCOUNTER — Encounter (HOSPITAL_COMMUNITY): Payer: Self-pay | Admitting: *Deleted

## 2011-05-09 ENCOUNTER — Inpatient Hospital Stay (HOSPITAL_COMMUNITY)
Admission: RE | Admit: 2011-05-09 | Discharge: 2011-05-12 | DRG: 708 | Disposition: A | Discharge status: Home / Self Care | Payer: Medicare Other | Source: Ambulatory Visit | Attending: Urology | Admitting: Urology

## 2011-05-09 ENCOUNTER — Other Ambulatory Visit: Payer: Self-pay | Admitting: Urology

## 2011-05-09 DIAGNOSIS — N400 Enlarged prostate without lower urinary tract symptoms: Principal | ICD-10-CM | POA: Diagnosis present

## 2011-05-09 DIAGNOSIS — I2510 Atherosclerotic heart disease of native coronary artery without angina pectoris: Secondary | ICD-10-CM | POA: Diagnosis present

## 2011-05-09 DIAGNOSIS — I10 Essential (primary) hypertension: Secondary | ICD-10-CM | POA: Diagnosis present

## 2011-05-09 HISTORY — PX: PROSTATECTOMY: SHX69

## 2011-05-09 LAB — ABO/RH: ABO/RH(D): AB POS

## 2011-05-09 LAB — TYPE AND SCREEN: ABO/RH(D): AB POS

## 2011-05-09 SURGERY — PROSTATECTOMY, SUPRAPUBIC APPROACH
Anesthesia: General | Site: Abdomen | Wound class: Clean Contaminated

## 2011-05-09 MED ORDER — THERA M PLUS PO TABS
1.0000 | ORAL_TABLET | Freq: Every day | ORAL | Status: DC
  Administered 2011-05-09 – 2011-05-12 (×4): 1 via ORAL
  Filled 2011-05-09 (×4): qty 1

## 2011-05-09 MED ORDER — SODIUM CHLORIDE 0.9 % IV BOLUS (SEPSIS)
1000.0000 mL | Freq: Once | INTRAVENOUS | Status: AC
  Administered 2011-05-09: 1000 mL via INTRAVENOUS

## 2011-05-09 MED ORDER — ONDANSETRON HCL 4 MG/2ML IJ SOLN
4.0000 mg | INTRAMUSCULAR | Status: DC | PRN
  Administered 2011-05-09: 4 mg via INTRAVENOUS
  Filled 2011-05-09: qty 2

## 2011-05-09 MED ORDER — FENTANYL CITRATE 0.05 MG/ML IJ SOLN
INTRAMUSCULAR | Status: DC | PRN
  Administered 2011-05-09: 50 ug via INTRAVENOUS
  Administered 2011-05-09: 100 ug via INTRAVENOUS
  Administered 2011-05-09 (×2): 50 ug via INTRAVENOUS

## 2011-05-09 MED ORDER — SODIUM CHLORIDE 0.9 % IR SOLN
Status: DC | PRN
  Administered 2011-05-09: 2000 mL

## 2011-05-09 MED ORDER — INDIGOTINDISULFONATE SODIUM 8 MG/ML IJ SOLN
INTRAMUSCULAR | Status: DC | PRN
  Administered 2011-05-09: 40 mg via INTRAVENOUS

## 2011-05-09 MED ORDER — DUTASTERIDE 0.5 MG PO CAPS
0.5000 mg | ORAL_CAPSULE | Freq: Every day | ORAL | Status: DC
  Administered 2011-05-09 – 2011-05-12 (×4): 0.5 mg via ORAL
  Filled 2011-05-09 (×4): qty 1

## 2011-05-09 MED ORDER — SIMVASTATIN 20 MG PO TABS
20.0000 mg | ORAL_TABLET | Freq: Every day | ORAL | Status: DC
  Administered 2011-05-09 – 2011-05-11 (×3): 20 mg via ORAL
  Filled 2011-05-09 (×4): qty 1

## 2011-05-09 MED ORDER — CEFAZOLIN SODIUM 1-5 GM-% IV SOLN
1.0000 g | Freq: Once | INTRAVENOUS | Status: AC
  Administered 2011-05-09: 1 g via INTRAVENOUS

## 2011-05-09 MED ORDER — EPHEDRINE SULFATE 50 MG/ML IJ SOLN
INTRAMUSCULAR | Status: DC | PRN
  Administered 2011-05-09: 10 mg via INTRAVENOUS

## 2011-05-09 MED ORDER — LACTATED RINGERS IV SOLN
INTRAVENOUS | Status: DC
  Administered 2011-05-09 – 2011-05-10 (×2): via INTRAVENOUS

## 2011-05-09 MED ORDER — ROCURONIUM BROMIDE 100 MG/10ML IV SOLN
INTRAVENOUS | Status: DC | PRN
  Administered 2011-05-09: 40 mg via INTRAVENOUS
  Administered 2011-05-09: 10 mg via INTRAVENOUS

## 2011-05-09 MED ORDER — BUPIVACAINE LIPOSOME 1.3 % IJ SUSP
20.0000 mL | INTRAMUSCULAR | Status: AC
  Administered 2011-05-09: 70 mL
  Filled 2011-05-09: qty 20

## 2011-05-09 MED ORDER — GLYCOPYRROLATE 0.2 MG/ML IJ SOLN
INTRAMUSCULAR | Status: DC | PRN
  Administered 2011-05-09: .5 mg via INTRAVENOUS

## 2011-05-09 MED ORDER — ONDANSETRON HCL 4 MG/2ML IJ SOLN
INTRAMUSCULAR | Status: DC | PRN
  Administered 2011-05-09: 4 mg via INTRAVENOUS

## 2011-05-09 MED ORDER — NEOSTIGMINE METHYLSULFATE 1 MG/ML IJ SOLN
INTRAMUSCULAR | Status: DC | PRN
  Administered 2011-05-09: 4 mg via INTRAVENOUS

## 2011-05-09 MED ORDER — HEMOSTATIC AGENTS (NO CHARGE) OPTIME
TOPICAL | Status: DC | PRN
  Administered 2011-05-09: 1 via TOPICAL

## 2011-05-09 MED ORDER — METOCLOPRAMIDE HCL 5 MG/ML IJ SOLN: 10.0000 mg | Freq: Once | INTRAMUSCULAR | Status: DC | PRN

## 2011-05-09 MED ORDER — LACTATED RINGERS IV SOLN
INTRAVENOUS | Status: DC | PRN
  Administered 2011-05-09 (×2): via INTRAVENOUS

## 2011-05-09 MED ORDER — CIPROFLOXACIN HCL 500 MG PO TABS
500.0000 mg | ORAL_TABLET | Freq: Two times a day (BID) | ORAL | Status: AC
  Administered 2011-05-09: 500 mg via ORAL
  Filled 2011-05-09: qty 1

## 2011-05-09 MED ORDER — ACETAMINOPHEN 10 MG/ML IV SOLN
INTRAVENOUS | Status: DC | PRN
  Administered 2011-05-09: 1000 mg via INTRAVENOUS

## 2011-05-09 MED ORDER — ACETAMINOPHEN 10 MG/ML IV SOLN
1000.0000 mg | Freq: Four times a day (QID) | INTRAVENOUS | Status: AC
  Administered 2011-05-09 – 2011-05-10 (×4): 1000 mg via INTRAVENOUS
  Filled 2011-05-09 (×4): qty 100

## 2011-05-09 MED ORDER — HYDROMORPHONE HCL PF 1 MG/ML IJ SOLN
0.2500 mg | INTRAMUSCULAR | Status: DC | PRN
  Administered 2011-05-09 (×2): 0.5 mg via INTRAVENOUS

## 2011-05-09 MED ORDER — OXYBUTYNIN CHLORIDE 5 MG PO TABS
5.0000 mg | ORAL_TABLET | Freq: Three times a day (TID) | ORAL | Status: DC | PRN
  Administered 2011-05-10 – 2011-05-11 (×3): 5 mg via ORAL
  Filled 2011-05-09 (×7): qty 1

## 2011-05-09 MED ORDER — PROPOFOL 10 MG/ML IV EMUL
INTRAVENOUS | Status: DC | PRN
  Administered 2011-05-09: 120 mg via INTRAVENOUS

## 2011-05-09 MED ORDER — HYDROMORPHONE HCL PF 1 MG/ML IJ SOLN
0.5000 mg | INTRAMUSCULAR | Status: DC | PRN
  Administered 2011-05-10: 1 mg via INTRAVENOUS
  Filled 2011-05-09: qty 1

## 2011-05-09 SURGICAL SUPPLY — 54 items
BAG URINE DRAINAGE (UROLOGICAL SUPPLIES) ×2 IMPLANT
BLADE EXTENDED COATED 6.5IN (ELECTRODE) ×2 IMPLANT
BLADE HEX COATED 2.75 (ELECTRODE) ×2 IMPLANT
BLADE SURG SZ12 CARB STEEL (BLADE) ×2 IMPLANT
CATH FOLEY 2WAY SLVR 30CC 22FR (CATHETERS) IMPLANT
CATH FOLEY 2WAY SLVR 30CC 24FR (CATHETERS) ×2 IMPLANT
CATH FOLEY 3WAY 30CC 24FR (CATHETERS)
CATH URET 5FR 28IN OPEN ENDED (CATHETERS) ×1 IMPLANT
CATH URTH STD 24FR FL 3W 2 (CATHETERS) ×1 IMPLANT
CLIP LIGATING HEM O LOK PURPLE (MISCELLANEOUS) ×1 IMPLANT
CLIP LIGATING HEMO O LOK GREEN (MISCELLANEOUS) ×1 IMPLANT
CLOTH BEACON ORANGE TIMEOUT ST (SAFETY) ×2 IMPLANT
COVER SURGICAL LIGHT HANDLE (MISCELLANEOUS) ×2 IMPLANT
DRAIN CHANNEL 10F 3/8 F FF (DRAIN) ×1 IMPLANT
DRAPE LAPAROTOMY T 102X78X121 (DRAPES) ×2 IMPLANT
DRAPE UTILITY 15X26 (DRAPE) ×2 IMPLANT
DRAPE WARM FLUID 44X44 (DRAPE) ×2 IMPLANT
ELECT REM PT RETURN 9FT ADLT (ELECTROSURGICAL) ×2
ELECTRODE REM PT RTRN 9FT ADLT (ELECTROSURGICAL) ×1 IMPLANT
EVACUATOR SILICONE 100CC (DRAIN) ×1 IMPLANT
FLOSEAL 10ML (HEMOSTASIS) ×1 IMPLANT
GAUZE SPONGE 4X4 16PLY XRAY LF (GAUZE/BANDAGES/DRESSINGS) ×2 IMPLANT
GLOVE BIOGEL M STRL SZ7.5 (GLOVE) ×2 IMPLANT
GOWN STRL REIN XL XLG (GOWN DISPOSABLE) ×3 IMPLANT
GUIDEWIRE STR DUAL SENSOR (WIRE) ×2 IMPLANT
KIT BASIN OR (CUSTOM PROCEDURE TRAY) ×2 IMPLANT
LUBRICANT JELLY ST 5GR 8946 (MISCELLANEOUS) ×4 IMPLANT
NEEDLE HYPO 22GX1.5 SAFETY (NEEDLE) ×2 IMPLANT
NS IRRIG 1000ML POUR BTL (IV SOLUTION) ×4 IMPLANT
PACK GENERAL/GYN (CUSTOM PROCEDURE TRAY) ×2 IMPLANT
PLUG CATH AND CAP STER (CATHETERS) ×3 IMPLANT
SCRUB PCMX 4 OZ (MISCELLANEOUS) ×2 IMPLANT
SET IRRIG Y TYPE TUR BLADDER L (SET/KITS/TRAYS/PACK) IMPLANT
SPONGE GAUZE 4X4 12PLY (GAUZE/BANDAGES/DRESSINGS) ×1 IMPLANT
SPONGE LAP 18X18 X RAY DECT (DISPOSABLE) ×2 IMPLANT
SPONGE LAP 4X18 X RAY DECT (DISPOSABLE) ×2 IMPLANT
STAPLER SKIN PROX WIDE 3.9 (STAPLE) IMPLANT
STAPLER VISISTAT 35W (STAPLE) ×1 IMPLANT
SUT ETHILON 3 0 PS 1 (SUTURE) ×3 IMPLANT
SUT PDS AB 1 CTX 36 (SUTURE) ×4 IMPLANT
SUT SILK 0 (SUTURE) ×2
SUT SILK 0 30XBRD TIE 6 (SUTURE) ×1 IMPLANT
SUT SILK 2 0 (SUTURE)
SUT SILK 2-0 30XBRD TIE 12 (SUTURE) IMPLANT
SUT VIC AB 0 UR5 27 (SUTURE) ×8 IMPLANT
SUT VIC AB 2-0 UR5 27 (SUTURE) IMPLANT
SUT VIC AB 2-0 UR6 27 (SUTURE) IMPLANT
SUT VICRYL 0 UR6 27IN ABS (SUTURE) ×1 IMPLANT
SYR 30ML LL (SYRINGE) ×2 IMPLANT
SYR CONTROL 10ML LL (SYRINGE) ×1 IMPLANT
TAPE HYPAFIX 4 X10 (GAUZE/BANDAGES/DRESSINGS) ×2 IMPLANT
TOWEL OR 17X26 10 PK STRL BLUE (TOWEL DISPOSABLE) ×3 IMPLANT
TOWEL OR NON WOVEN STRL DISP B (DISPOSABLE) ×2 IMPLANT
WATER STERILE IRR 1500ML POUR (IV SOLUTION) ×2 IMPLANT

## 2011-05-09 NOTE — Preoperative (Signed)
Beta Blockers   Reason not to administer Beta Blockers:Not Applicable 

## 2011-05-09 NOTE — Anesthesia Postprocedure Evaluation (Signed)
  Anesthesia Post-op Note  Patient: Charles Terry  Procedure(s) Performed:  PROSTATECTOMY SUPRAPUBIC - Open  Patient Location: PACU  Anesthesia Type: General  Level of Consciousness: awake and alert   Airway and Oxygen Therapy: Patient Spontanous Breathing  Post-op Pain: mild  Post-op Assessment: Post-op Vital signs reviewed, Patient's Cardiovascular Status Stable, Respiratory Function Stable, Patent Airway and No signs of Nausea or vomiting  Post-op Vital Signs: stable  Complications: No apparent anesthesia complications

## 2011-05-09 NOTE — Transfer of Care (Signed)
Immediate Anesthesia Transfer of Care Note  Patient: Charles Terry  Procedure(s) Performed:  PROSTATECTOMY SUPRAPUBIC - Open  Patient Location: PACU  Anesthesia Type: General  Level of Consciousness: awake, alert , oriented and patient cooperative  Airway & Oxygen Therapy: Patient Spontanous Breathing and Patient connected to face mask oxygen  Post-op Assessment: Report given to PACU RN and Post -op Vital signs reviewed and stable  Post vital signs: Reviewed and stable  Complications: No apparent anesthesia complications

## 2011-05-09 NOTE — Anesthesia Preprocedure Evaluation (Signed)
Anesthesia Evaluation  Patient identified by MRN, date of birth, ID band Patient awake    Reviewed: Allergy & Precautions, H&P , NPO status , Patient's Chart, lab work & pertinent test results, reviewed documented beta blocker date and time   Airway Mallampati: II TM Distance: >3 FB Neck ROM: Full    Dental  (+) Teeth Intact, Caps and Dental Advisory Given   Pulmonary  clear to auscultation        Cardiovascular Regular Normal    Neuro/Psych    GI/Hepatic   Endo/Other    Renal/GU      Musculoskeletal   Abdominal   Peds  Hematology   Anesthesia Other Findings   Reproductive/Obstetrics                           Anesthesia Physical Anesthesia Plan  ASA: III  Anesthesia Plan: General   Post-op Pain Management:    Induction: Intravenous  Airway Management Planned: Oral ETT  Additional Equipment:   Intra-op Plan:   Post-operative Plan:   Informed Consent: I have reviewed the patients History and Physical, chart, labs and discussed the procedure including the risks, benefits and alternatives for the proposed anesthesia with the patient or authorized representative who has indicated his/her understanding and acceptance.   Dental advisory given  Plan Discussed with: CRNA  Anesthesia Plan Comments:         Anesthesia Quick Evaluation

## 2011-05-09 NOTE — H&P (Signed)
Charles Terry is an 77 y.o. male.    HPI: The patient is an 77 year old male, status post left inguinal herniorrhaphy in May of 2012, followed by right lower externally phlebitis, treated with anticoagulation. However, he developed gross hematuria, as well as urinary retention, with CT showing massive right renal hemorrhagic cyst. He was considered for a caval stent, but the cyst was pushing in on the IVC, and he was considered too high risk for that procedure. He is a chronic Foley catheterization, despite years of finasteride, and more recently hamulus and. He failed multiple voiding trials, with recent video urodynamics showing Xeroflo, but with 30 cm water bladder pressure. No history resent urine culture showing Acinetobacter, and enterococcus, both sensitive to Cipro. Prostate ultrasound study 70.14 g prostate. Cystoscopy showed enlarged prostate, he is now for open prostatectomy.   Past Medical History  Diagnosis Date  . Enlarged prostate   . History of AAA (abdominal aortic aneurysm) repair   . History of blood clots     right lower extremity dvt  after inguinal hernia surgery may 2012--doppler follow up report 04/07/11 at [facility] heart and vaxcular shows resolution of the dvt--but pt is to have IVC filter placed 05/05/11--prior to planned prostate surgery scheduled for 05/09/11.  . Coronary artery disease   . Hypertension   . Blood transfusion MAY 2012  . Renal cyst may 2012    bleeding from right renal cyst /acute renal failure secondary to urinary retention from bph --pt states  the cyst is no longer a problem--pt still has indwelling foley catheter--plans  open  suprapubic prostatectomy on 11/19 with dr. Patsi Sears    Past Surgical History  Procedure Date  . Cardiac surgery 2005  . Aaa rpair 2006  . Hernia repair     MAY 2012    Medications Prior to Admission  Medication Dose Route Frequency Provider Last Rate Last Dose  . ceFAZolin (ANCEF) IVPB 1 g/50 mL premix  1 g  Intravenous Once Kathi Ludwig, MD       No current outpatient prescriptions on file as of 05/09/2011.    Allergies:  Allergies  Allergen Reactions  . Aspirin Other (See Comments)    Unknown   . Food Swelling    KIWI FRUIT. THROAT SWELLING    History reviewed. No pertinent family history.  Social History:  reports that he has quit smoking. He has never used smokeless tobacco. He reports that he drinks alcohol. He reports that he does not use illicit drugs.  Review of Systems: Pertinent items are noted in HPI. A comprehensive review of systems was negative except for: no voiding without catheter. Dribbling, frequency, urgency, gross hematuria.  Remaining constitutional ROS significant for no leg pain, no SOB, no chest pain.   Results for orders placed during the hospital encounter of 05/09/11 (from the past 48 hour(s))  PROTIME-INR     Status: Normal   Collection Time   05/09/11  7:00 AM      Component Value Range Comment   Prothrombin Time 13.1  11.6 - 15.2 (seconds)    INR 0.97  0.00 - 1.49    TYPE AND SCREEN     Status: Normal (Preliminary result)   Collection Time   05/09/11  8:30 AM      Component Value Range Comment   ABO/RH(D) AB POS      Antibody Screen PENDING      Sample Expiration 05/12/2011       No results found.  Temp:  [  97.6 F (36.4 C)] 97.6 F (36.4 C) (11/19 0646) Pulse Rate:  [55] 55  (11/19 0646) Resp:  [16] 16  (11/19 0646) BP: (152)/(72) 152/72 mmHg (11/19 0646) SpO2:  [98 %] 98 % (11/19 0646) Weight:  [79.379 kg (175 lb)] 175 lb (79.379 kg) (11/19 1610)  Physical Exam: General appearance: alert and appears stated age Head: Normocephalic, without obvious abnormality, atraumatic Eyes: conjunctivae/corneas clear. EOM's intact.  Oropharynx: moist mucous membranes Neck: supple, symmetrical, trachea midline Resp: normal respiratory effort Cardio: regular rate and rhythm Back: symmetric, no curvature. ROM normal. No CVA tenderness. GI:  soft, non-tender; bowel sounds normal; no masses,  no organomegaly Male genitalia: penis: normal male phallus with no lesions or discharge.Testes: bilaterally descended with no masses or tenderness. no hernias Pelvic: deferred Extremities: extremities normal, atraumatic, no cyanosis or edema Skin: Skin color normal. No visible rashes or lesions Neurologic: Grossly normal  Assessment/Plan BPH for open prostatectomy.   Charles Terry I 05/09/2011, 8:31 AM

## 2011-05-09 NOTE — Op Note (Addendum)
With the patient in the supine position, general endotracheal anesthesia is introduced very at the abdomen is prepped with antibiotic soaked solution, and draped in usual fashion.  Review history: The patient is an 77 year old male, with chronic urinary retention following a left anal hernia repair in May of 2012. This was complicated by right lower extremity phlebitis, treated with anticoagulation, resulting in gross hematuria, with a very large right renal cyst, impinging on the vena cava. He is now resolved this hematoma,  is status post IVC filter, and is now for open prostatectomy of enlarged median lobe. He has a history of Acinetobacter and enterococcus, sensitive to Cipro when he had his catheter in place. In addition, he is noted to have a 70 g prostate, with large intermedium lobe obstructing his urethra. Urodynamic shows that he has a 35 cementer water bladder pressure but no flow.  Expectation is for excellent result.  Procedure: A 10 cm midline incision is made from the pubis tubercle to the umbilicus. Subcutaneous tissue was dissected in the midline with the electrosurgical unit. Self-retaining retractors placed, and the bladder is filled with water via a 20 cc catheter with 30 cc balloon in place. Once inflated, 2 separate 2-0 Vicryl sutures were placed as holding sutures, and the bladder is entered using the electrosurgical unit. Fluid is drained, and the bladder was packed open. Very large median lobe was identified. Indigocarmine was given and blue contrast is seen from the lateral placed or feces in a trabeculated bladder. No bladder stones or tumors identified. Using the electrosurgical unit, the area of incision in the median lobe was scored, and using a blade knife, the median lobe is dissected with both sharp and blunt dissection. The median lobe was then amputated. Was felt that the lateral lobes did not need more dissection. Cauterization was accomplished, but there was some bleeding  from the prostate bed. Therefore, elected to place FloSeal and the prostate bed and held it for 3 minutes with resolution of bleeding. Following this, was irrigated with saline solution. The Foley catheter was placed under mild traction, and the bladder closed in 2 layers with 0 Vicryl suture. Irrigation was accomplished and showed watertight anastomosis except for one small leak at the top of the bladder incision therefore, piece of fat was brought over the incision, and sutured in place. This was accomplished with 2-0 Vicryl suture. Repeat bladder installation showed watertight closure.  A 10 flat fully fluted drain is in Plast through the left lower quadrant, and sutured in place with 3-0 nylon suture. It was then closed with 0 Vicryl suture. Skin staples were used for the skin. The patient had Expariell injected into the wound prior to closure (60 cc). He received IV Toradol prior to the case. He was awakened taken recovery room in good condition. Surgeon: Imelda Pillow MD 1st assistant: Raylene Miyamoto MD

## 2011-05-10 LAB — URINE MICROSCOPIC-ADD ON

## 2011-05-10 LAB — HEMOGLOBIN AND HEMATOCRIT, BLOOD
HCT: 34.2 % — ABNORMAL LOW (ref 39.0–52.0)
Hemoglobin: 11.8 g/dL — ABNORMAL LOW (ref 13.0–17.0)

## 2011-05-10 LAB — URINALYSIS, ROUTINE W REFLEX MICROSCOPIC
Glucose, UA: NEGATIVE mg/dL
Protein, ur: 100 mg/dL — AB
Specific Gravity, Urine: 1.008 (ref 1.005–1.030)
pH: 7.5 (ref 5.0–8.0)

## 2011-05-10 LAB — BASIC METABOLIC PANEL
Calcium: 8.6 mg/dL (ref 8.4–10.5)
GFR calc Af Amer: >90 mL/min (ref >90)
GFR calc non Af Amer: 79 mL/min — ABNORMAL LOW (ref >90)
Potassium: 3.7 mEq/L (ref 3.5–5.1)
Sodium: 137 mEq/L (ref 135–145)

## 2011-05-10 MED ORDER — BISACODYL 5 MG PO TBEC: 5.0000 mg | DELAYED_RELEASE_TABLET | Freq: Every day | ORAL | Status: DC | PRN

## 2011-05-10 MED ORDER — LIDOCAINE HCL 2 % EX GEL
Freq: Three times a day (TID) | CUTANEOUS | Status: DC | PRN
  Administered 2011-05-10: 1 via URETHRAL
  Administered 2011-05-10: 08:00:00 via URETHRAL
  Filled 2011-05-10: qty 5

## 2011-05-10 MED ORDER — OXYBUTYNIN CHLORIDE ER 5 MG PO TB24
5.0000 mg | ORAL_TABLET | Freq: Every day | ORAL | Status: DC
  Administered 2011-05-10 – 2011-05-11 (×2): 5 mg via ORAL
  Filled 2011-05-10 (×3): qty 1

## 2011-05-10 MED ORDER — CIPROFLOXACIN HCL 500 MG PO TABS
500.0000 mg | ORAL_TABLET | Freq: Two times a day (BID) | ORAL | Status: AC
  Administered 2011-05-10: 500 mg via ORAL
  Filled 2011-05-10: qty 1

## 2011-05-10 MED ORDER — HYDROMORPHONE HCL PF 1 MG/ML IJ SOLN
1.0000 mg | INTRAMUSCULAR | Status: DC | PRN
  Administered 2011-05-10 – 2011-05-11 (×3): 1 mg via INTRAVENOUS
  Filled 2011-05-10 (×4): qty 1

## 2011-05-10 NOTE — Progress Notes (Signed)
  Subjective: Patient reports incisional pain and pain control poor.  Objective: Vital signs in last 24 hours: Temp:  [96.9 F (36.1 C)-98.7 F (37.1 C)] 98.5 F (36.9 C) (11/20 0547) Pulse Rate:  [60-69] 64  (11/20 0547) Resp:  [15-16] 16  (11/20 0547) BP: (104-173)/(50-86) 148/73 mmHg (11/20 0547) SpO2:  [95 %-100 %] 95 % (11/20 0547) Weight:  [83.6 kg (184 lb 4.9 oz)] 184 lb 4.9 oz (83.6 kg) (11/19 1252)  Intake/Output from previous day: 11/19 0701 - 11/20 0700 In: 4836.7 [I.V.:3446.7; IV Piggyback:1300] Out: 3620 [Urine:3225; Drains:95; Blood:300] Intake/Output this shift:    Physical Exam:  General:alert, cooperative, appears stated age, fatigued and no distress GI: not done and tenderness: suprapubic Male genitalia: not done no bladder distension noted  Lungs - Normal respiratory effort, chest expands symmetrically.  Abdomen - Soft, non-tender & non-distended  Lab Results:  [facility] 05/10/11 0440  HGB 11.8*  HCT 34.2*   BMET  Basename 05/10/11 0440  NA 137  K 3.7  CL 105  CO2 26  GLUCOSE 121*  BUN 12  CREATININE 0.88  CALCIUM 8.6    Basename 05/09/11 0700  LABPT --  INR 0.97   No results found for this basename: LABURIN:1 in the last 72 hours Results for orders placed in visit on 05/02/11  SURGICAL PCR SCREEN     Status: Abnormal   Collection Time   05/02/11 10:15 AM      Component Value Range Status Comment   MRSA, PCR NEGATIVE  NEGATIVE  Final    Staphylococcus aureus POSITIVE (*) NEGATIVE  Final     Studies/Results: No results found.  Assessment/Plan:  Pt is having post op pain despite long acting local anesthetic and IV Tylenol. His urine is clear today. He will need to keep his foley for 10 days. Anticipate D/C Wed or Thursday.    LOS: 1 day   Keonta Monceaux I 05/10/2011, 9:38 AM

## 2011-05-10 NOTE — Progress Notes (Signed)
Patient continue to C/O pain around the penis after giving PRN pain medication and perform foley care and adjust it.   A. Bruning-PA notified, order given. Will continue to asses pt.

## 2011-05-10 NOTE — Plan of Care (Signed)
Problem: Phase I Progression Outcomes Goal: Walk in halls when awake from anesthesia Outcome: Completed/Met Date Met:  05/10/11 Patient able to ambulate without any distress during ambulation.

## 2011-05-11 ENCOUNTER — Encounter (HOSPITAL_COMMUNITY): Payer: Self-pay | Admitting: Urology

## 2011-05-11 MED ORDER — BISACODYL 5 MG PO TBEC: 5.0000 mg | DELAYED_RELEASE_TABLET | Freq: Three times a day (TID) | ORAL | Status: DC | PRN

## 2011-05-11 MED ORDER — CIPROFLOXACIN HCL 500 MG PO TABS
500.0000 mg | ORAL_TABLET | Freq: Two times a day (BID) | ORAL | Status: DC
  Administered 2011-05-11 – 2011-05-12 (×3): 500 mg via ORAL
  Filled 2011-05-11 (×4): qty 1

## 2011-05-11 MED ORDER — METOPROLOL TARTRATE 12.5 MG HALF TABLET
12.5000 mg | ORAL_TABLET | Freq: Two times a day (BID) | ORAL | Status: DC
  Administered 2011-05-11 – 2011-05-12 (×3): 12.5 mg via ORAL
  Filled 2011-05-11 (×4): qty 1

## 2011-05-11 MED ORDER — BISACODYL 10 MG RE SUPP
10.0000 mg | Freq: Once | RECTAL | Status: AC
  Administered 2011-05-11: 10 mg via RECTAL
  Filled 2011-05-11: qty 1

## 2011-05-12 MED ORDER — OXYBUTYNIN CHLORIDE 5 MG PO TABS: 5.0000 mg | ORAL_TABLET | Freq: Three times a day (TID) | ORAL | Status: DC | PRN

## 2011-05-12 MED ORDER — LIDOCAINE HCL 2 % EX GEL: Freq: Three times a day (TID) | CUTANEOUS | Status: AC | PRN

## 2011-05-12 MED ORDER — CIPROFLOXACIN HCL 500 MG PO TABS: 500.0000 mg | ORAL_TABLET | Freq: Two times a day (BID) | ORAL | Status: AC

## 2011-05-12 NOTE — Discharge Summary (Addendum)
Physician Discharge Summary  Patient ID: Charles Terry MRN: [id number] DOB/AGE: 1928/05/31 77 y.o.  Admit date: 05/09/2011 Discharge date: 05/12/2011  Admission Diagnoses:  Discharge Diagnoses:  Active Problems:  BPH (benign prostatic hyperplasia)   Discharged Condition: good  Hospital Course: Admitted 05/09/11 for open prostectomy. Hospital course has been uneventful. Pain has been controlled with PO Oxycodone.   Consults: none  Significant Diagnostic Studies:   Treatments: surgery: Open prostastectomy by Dr. Jethro Bolus 05/09/11  Discharge Exam: Blood pressure 131/71, pulse 64, temperature 98.5 F (36.9 C), temperature source Oral, resp. rate 20, height 5\' 7"  (1.702 m), weight 83.6 kg (184 lb 4.9 oz), SpO2 94.00%. General appearance: alert, cooperative and no distress Incision/Wound: CDI with staples intact. No redness, drainage, or swelling noted. Staples to be removed at PO visit.  Disposition: Home or Self Care   Current Discharge Medication List    START taking these medications   Details  ciprofloxacin (CIPRO) 500 MG tablet Take 1 tablet (500 mg total) by mouth 2 (two) times daily. Qty: 20 tablet, Refills: 0    lidocaine (XYLOCAINE) 2 % jelly Apply topically 3 (three) times daily as needed (Pain). Apply to meatus for discomfort PRN Qty: 30 mL, Refills: 0    oxybutynin (DITROPAN) 5 MG tablet Take 1 tablet (5 mg total) by mouth every 8 (eight) hours as needed. Qty: 30 tablet, Refills: 1      CONTINUE these medications which have NOT CHANGED   Details  ferrous gluconate (FERGON) 325 MG tablet Take 325 mg by mouth 2 (two) times daily.     metoprolol tartrate (LOPRESSOR) 25 MG tablet Take 12.5 mg by mouth 2 (two) times daily.     Multiple Vitamins-Minerals (MULTIVITAMINS THER. W/MINERALS) TABS Take 1 tablet by mouth daily.     simvastatin (ZOCOR) 40 MG tablet Take 20 mg by mouth at bedtime.     Tamsulosin HCl (FLOMAX PO) Take 0.4 mg by mouth daily.      Warfarin Sodium (COUMADIN PO) Take 5-7.5 mg by mouth daily. 5 MG ONE DAY THEN 7.5 MG THE NEXT DAY      STOP taking these medications     Dutasteride (AVODART PO)          SignedJetta Lout 05/12/2011, 8:51 AM

## 2011-05-12 NOTE — Progress Notes (Signed)
Pt was educated on Foley care and leg bag care prior to discharge.

## 2011-05-12 NOTE — Progress Notes (Signed)
3 Days Post-Op Subjective: Patient reports + BM, tolerating PO and pain control good. "I'm ready to go!"  Objective: Vital signs in last 24 hours: Temp:  [97.5 F (36.4 C)-98.8 F (37.1 C)] 98.5 F (36.9 C) (11/22 0500) Pulse Rate:  [64-88] 64  (11/22 0500) Resp:  [18-20] 20  (11/22 0500) BP: (126-131)/(71-73) 131/71 mmHg (11/22 0500) SpO2:  [93 %-96 %] 94 % (11/22 0500)  Intake/Output from previous day: 11/21 0701 - 11/22 0700 In: 820 [P.O.:720] Out: 1434 [Urine:1430; Drains:4] Intake/Output this shift:    Physical Exam:  General:alert, cooperative and no distress GI: not done and soft, non tender, normal bowel sounds, no palpable masses, no organomegaly, no inguinal hernia Male genitalia: not done Foley in meatus.  Resp: clear to auscultation bilaterally Cardio: regular rate and rhythm, S1, S2 normal, no murmur, click, rub or gallop Abd incison CDI w/staples. Bruising noted. No redness or drainage  Lab Results:  [facility] 05/10/11 0440  HGB 11.8*  HCT 34.2*   BMET  Basename 05/10/11 0440  NA 137  K 3.7  CL 105  CO2 26  GLUCOSE 121*  BUN 12  CREATININE 0.88  CALCIUM 8.6   No results found for this basename: LABPT:3,INR:3 in the last 72 hours No results found for this basename: LABURIN:1 in the last 72 hours Results for orders placed during the hospital encounter of 05/09/11  URINE CULTURE     Status: Normal   Collection Time   05/10/11  7:25 AM      Component Value Range Status Comment   Specimen Description URINE, CATHETERIZED   Final    Special Requests NONE   Final    Setup Time [id number]   Final    Colony Count NO GROWTH   Final    Culture NO GROWTH   Final    Report Status 05/11/2011 FINAL   Final     Studies/Results: No results found.  Assessment/Plan: Status post open prostectomy 05/09/11 by Dr. Patsi Sears D/C home w/foley     LOS: 3 days   [employer] 05/12/2011, 8:11 AM

## 2012-06-18 IMAGING — CT CT ABD-PELV W/O CM
2 of 3 series · 13 of 36 positions shown, 18 images · non-contrast
Comparison: CT abdomen and pelvis of 12/23/2003

CLINICAL DATA: Status post hernia repair, nausea and vomiting,
evaluate for small bowel obstruction

CT ABDOMEN AND PELVIS WITHOUT CONTRAST
TECHNIQUE: Multidetector CT imaging of the abdomen and pelvis was
performed following the standard protocol without intravenous
contrast.

[Series 2: abd/pelv w/o 5.0 b31f st · axial · non-contrast · 0.82mm/px · z∈[-424,+26]mm · 12 of 104 slices shown, 16 images]
[im 9/104  soft-tissue]
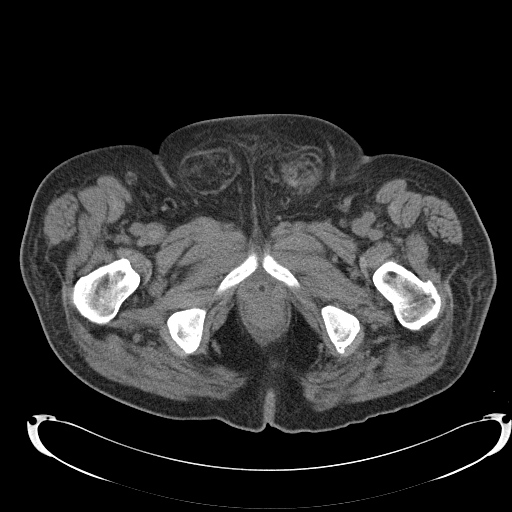
[im 9/104  bone]
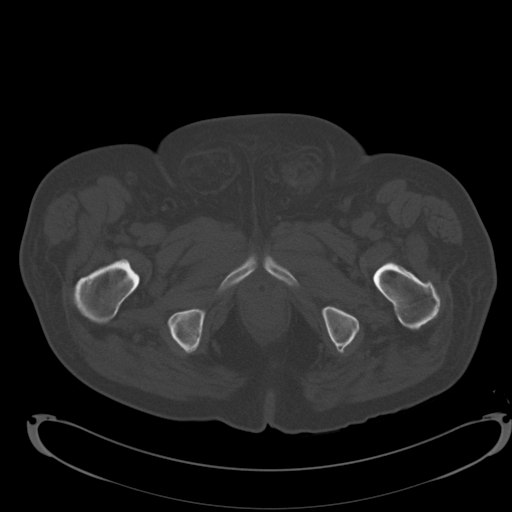
[im 18/104  soft-tissue]
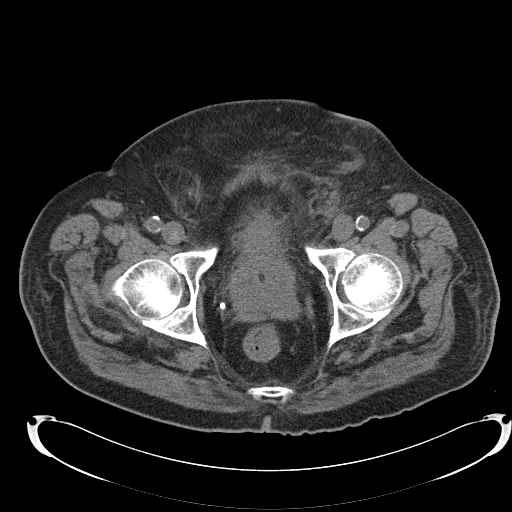
[im 26/104  soft-tissue]
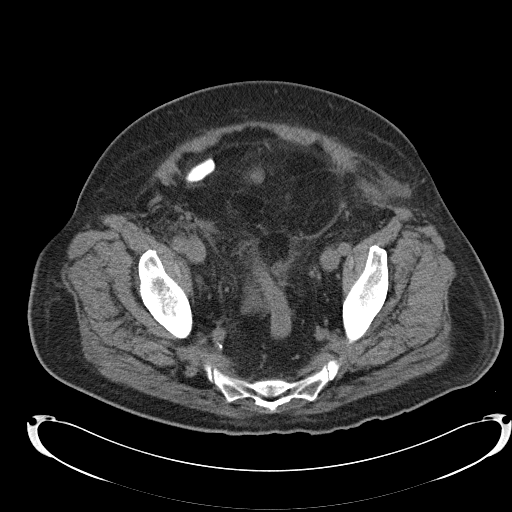
[im 39/104  soft-tissue]
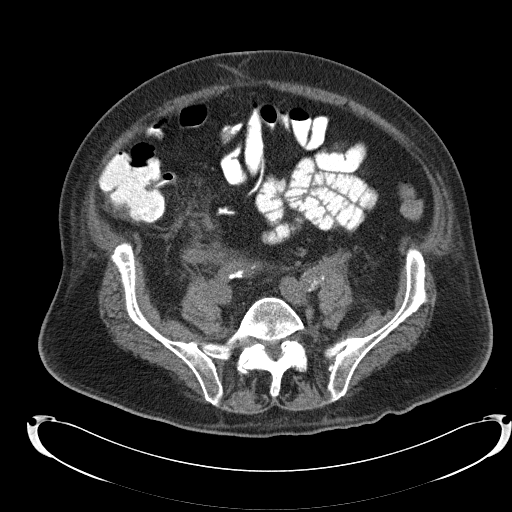
[im 48/104  soft-tissue]
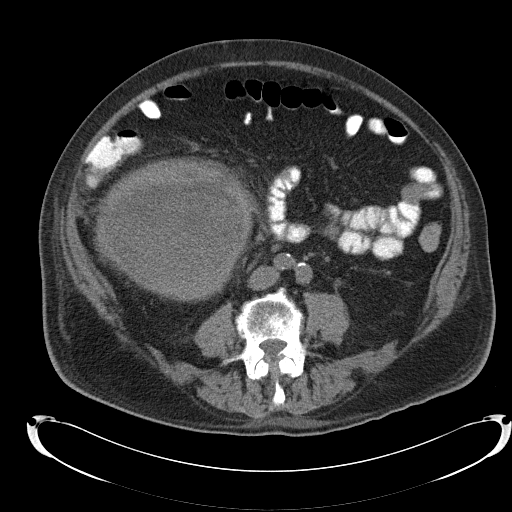
[im 56/104  soft-tissue]
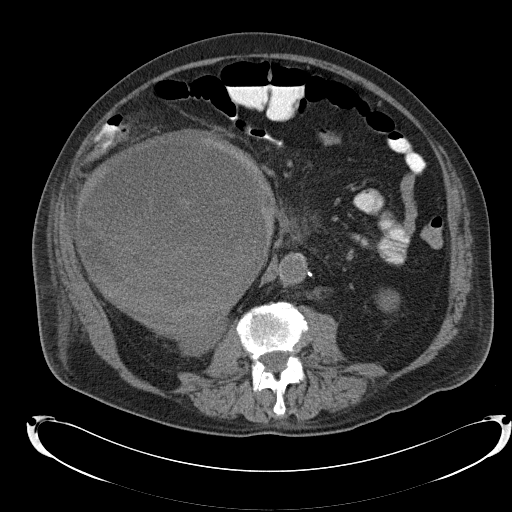
[im 65/104  soft-tissue]
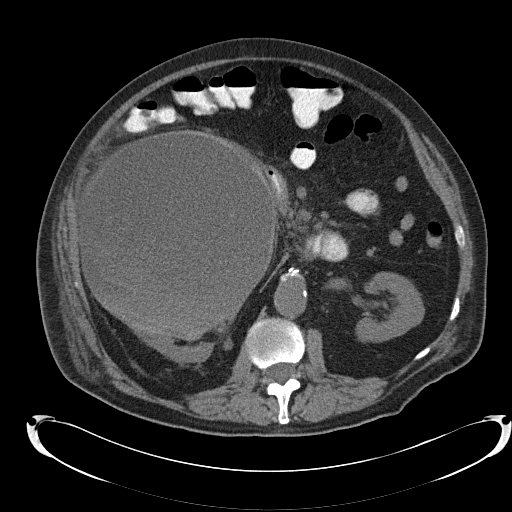
[im 78/104  soft-tissue]
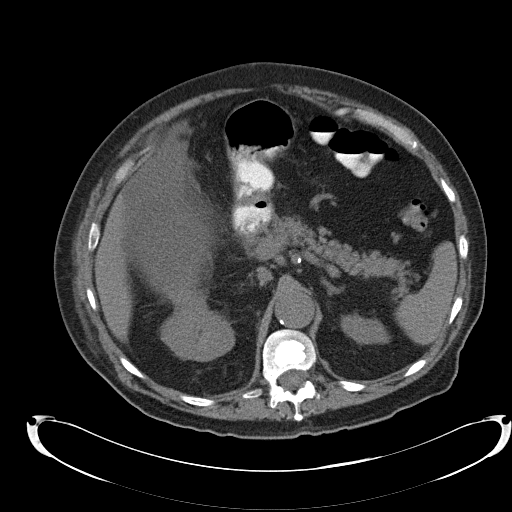
[im 86/104  soft-tissue]
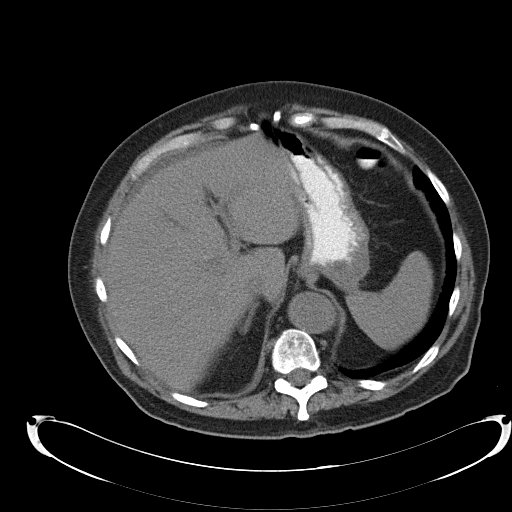
[im 86/104  lung]
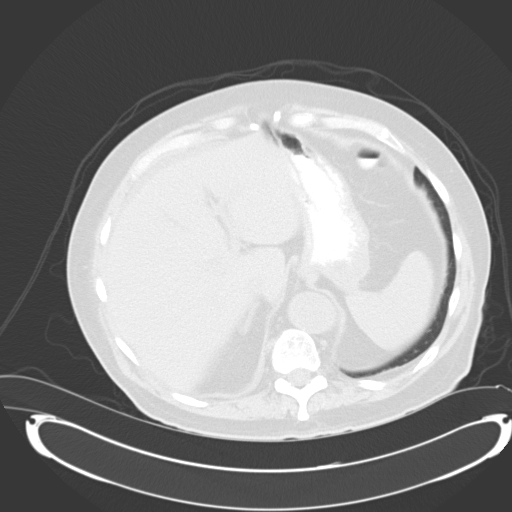
[im 86/104  bone]
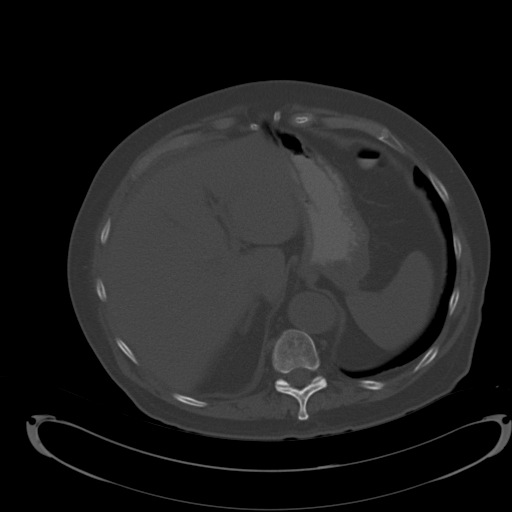
[im 91/104  lung]
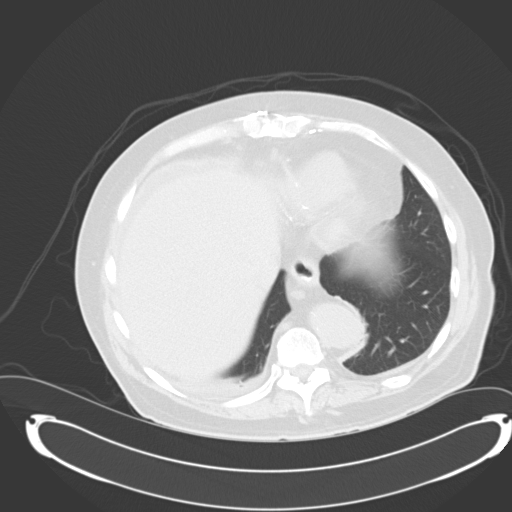
[im 95/104  soft-tissue]
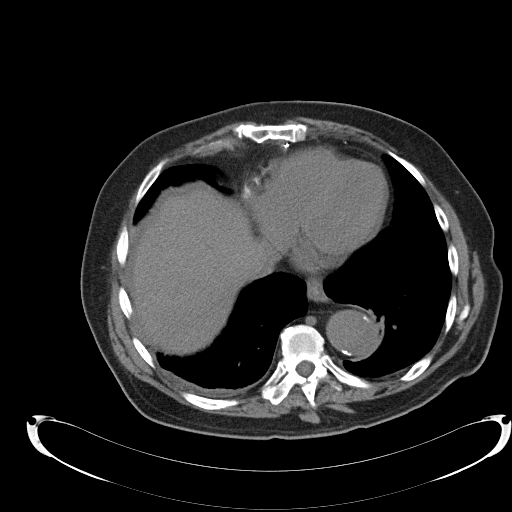
[im 95/104  lung]
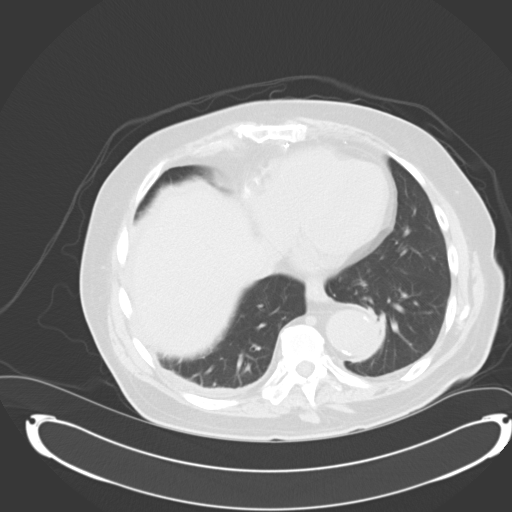
[im 99/104  lung]
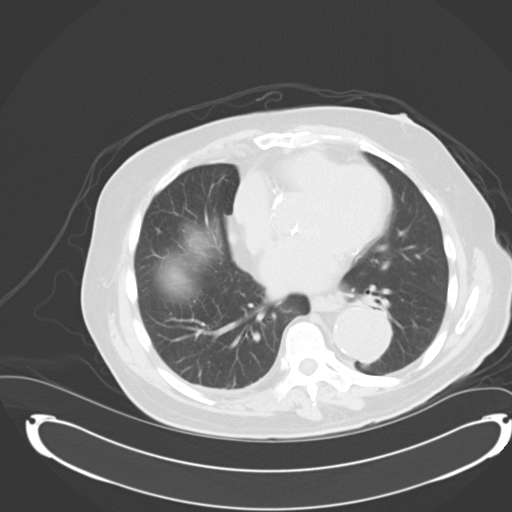

[Series 6: abd/pelv w/o 2.0 spo sag thins · sagittal · non-contrast · 1.04mm/px · 1 of 194 slices shown, 2 images]
[im 65/194  soft-tissue]
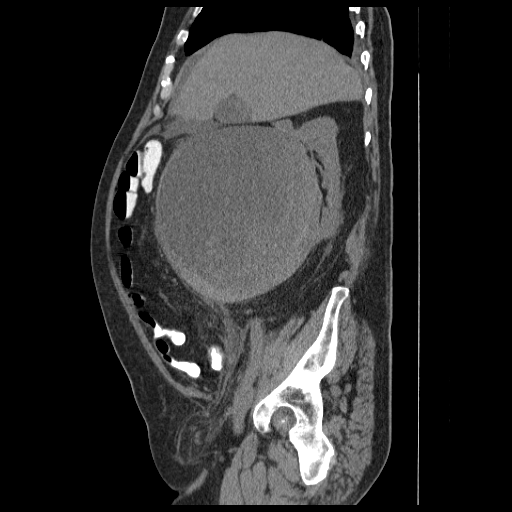
[im 65/194  bone]
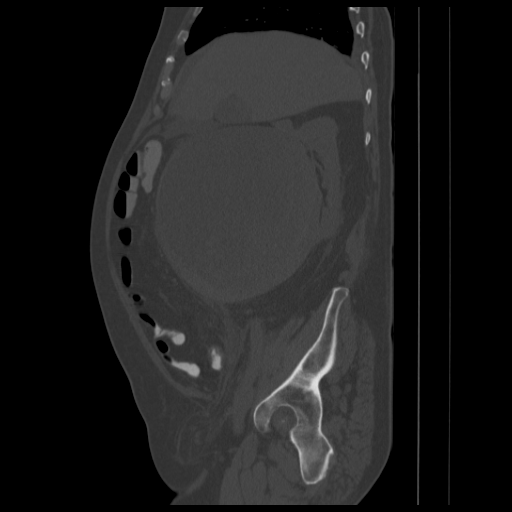

[13 of 36 positions shown; findings below may reference images not displayed]

FINDINGS: There is a small right pleural effusion present.  Median
sternotomy sutures are noted and coronary artery calcifications are
present.  A small amount of perihepatic ascites is present.  No
intrahepatic ductal dilatation is seen.  Small gallstones layer
within the gallbladder.  The pancreas is normal in size and the
pancreatic duct is not dilated.  The adrenal glands and spleen are
unremarkable.  The stomach is not well distended but unremarkable.
Probable dystrophic calcification is noted along the upper aspect
of the right kidney adjacent to a large cyst.  On prior study this
large right renal cyst appearing simple.  However on the current
study this cyst is larger and has a thickened wall with probable
some internal hemorrhage and more prominent soft tissue inferiorly.
Therefore this is worrisome for possible cystic renal neoplasm
measuring approximately 16.4 x 17.0 x 18.2 cm.  This could be a
complicated renal cyst with internal hemorrhage of possible
infection, but carcinoma cannot be excluded.  MRI may be helpful to
assess further.  Two left renal cysts are unchanged.  The abdominal
aorta is normal in caliber.

The urinary bladder is decompressed with Foley catheter.  The
appendix and terminal ileum are unremarkable.  Some fluid layers
within the pelvis probably due to a small amount of ascites.  There
is also slight extrinsic compression of the urinary bladder by
fatty density in the left pelvis of questionable significance.  Fat
enters both inguinal canals as well.  Degenerative changes are
present in the lower lumbar spine.
IMPRESSION: 1.  Large complicated right renal cyst with thick wall and mixed
attenuation.  This could represent hemorrhage within the
preexisting right renal cyst, but the thick walls and soft tissue
components are worrisome for cystic neoplasm.  Urology consultation
recommended.
2.  Small right pleural effusion.
3.  Small amount of ascites.
4.  Gallstones.

## 2012-10-30 ENCOUNTER — Telehealth: Payer: Self-pay | Admitting: Cardiology

## 2012-10-30 MED ORDER — LOSARTAN POTASSIUM 50 MG PO TABS: 50.0000 mg | ORAL_TABLET | Freq: Every day | ORAL | Status: DC

## 2012-10-30 NOTE — Telephone Encounter (Signed)
Refill losartan sent to  [facility]

## 2012-10-30 NOTE — Telephone Encounter (Signed)
Need refill on his Losartan #[phone]-Call to [phone]

## 2012-10-31 ENCOUNTER — Other Ambulatory Visit: Payer: Self-pay | Admitting: *Deleted

## 2012-10-31 MED ORDER — LOSARTAN POTASSIUM 100 MG PO TABS: ORAL_TABLET | ORAL | Status: DC

## 2012-10-31 NOTE — Telephone Encounter (Signed)
rx sent pharmacy 

## 2013-03-12 ENCOUNTER — Telehealth: Payer: Self-pay | Admitting: Cardiology

## 2013-03-12 NOTE — Telephone Encounter (Signed)
Returned call.  Pt stated a couple of years ago he had blood clots and had a hernia operation.  Stated he started bleeding and had clots.   In hospital for several days.  Stated they put a blood filter in his R shoulder.  Stated Dr. Herbie Baltimore told him they should have put in down lower and stated he wished they had taken it out after the operation.  Stated it has never been a problem, but over the last couple of days he has been having pain in that area.  Pt c/o upper R side chest pain near the incision site where the filter was put in.  Described pain as an ache that comes occasionally.  Denied other symptoms w/ pain.  Pt informed Dr. Herbie Baltimore will be notified for further instructions.  Pt verbalized understanding and agreed w/ plan.  Message forwarded to Dr. Herbie Baltimore.  Paper chart# [id number] on cart w/ this note.

## 2013-03-12 NOTE — Telephone Encounter (Signed)
Have asked that he be scheduled to come in for a NP/PA visit.  Marykay Lex, MD

## 2013-03-12 NOTE — Telephone Encounter (Signed)
Please call been hurting in his chest for the last few days-it hurting in the area where he has tthe filter in his chest..

## 2013-03-13 NOTE — Telephone Encounter (Signed)
Returned call and informed pt per instructions by MD/PA.  Pt verbalized understanding and agreed w/ plan.  Appt scheduled for tomorrow (9/25) at 8:40am w/ Corine Shelter, PA-C.

## 2013-03-14 ENCOUNTER — Encounter: Payer: Self-pay | Admitting: Cardiology

## 2013-03-14 ENCOUNTER — Ambulatory Visit (INDEPENDENT_AMBULATORY_CARE_PROVIDER_SITE_OTHER): Payer: Medicare Other | Admitting: Cardiology

## 2013-03-14 VITALS — BP 142/78 | HR 57 | Ht 67.0 in | Wt 172.2 lb

## 2013-03-14 DIAGNOSIS — N138 Other obstructive and reflux uropathy: Secondary | ICD-10-CM | POA: Insufficient documentation

## 2013-03-14 DIAGNOSIS — I10 Essential (primary) hypertension: Secondary | ICD-10-CM | POA: Insufficient documentation

## 2013-03-14 DIAGNOSIS — R0789 Other chest pain: Secondary | ICD-10-CM | POA: Insufficient documentation

## 2013-03-14 DIAGNOSIS — I359 Nonrheumatic aortic valve disorder, unspecified: Secondary | ICD-10-CM

## 2013-03-14 DIAGNOSIS — N401 Enlarged prostate with lower urinary tract symptoms: Secondary | ICD-10-CM

## 2013-03-14 DIAGNOSIS — I358 Other nonrheumatic aortic valve disorders: Secondary | ICD-10-CM | POA: Insufficient documentation

## 2013-03-14 DIAGNOSIS — I2510 Atherosclerotic heart disease of native coronary artery without angina pectoris: Secondary | ICD-10-CM

## 2013-03-14 DIAGNOSIS — I82401 Acute embolism and thrombosis of unspecified deep veins of right lower extremity: Secondary | ICD-10-CM

## 2013-03-14 DIAGNOSIS — E785 Hyperlipidemia, unspecified: Secondary | ICD-10-CM | POA: Insufficient documentation

## 2013-03-14 DIAGNOSIS — N139 Obstructive and reflux uropathy, unspecified: Secondary | ICD-10-CM

## 2013-03-14 DIAGNOSIS — Z9889 Other specified postprocedural states: Secondary | ICD-10-CM

## 2013-03-14 DIAGNOSIS — Z95828 Presence of other vascular implants and grafts: Secondary | ICD-10-CM

## 2013-03-14 DIAGNOSIS — Z86718 Personal history of other venous thrombosis and embolism: Secondary | ICD-10-CM

## 2013-03-14 DIAGNOSIS — I82409 Acute embolism and thrombosis of unspecified deep veins of unspecified lower extremity: Secondary | ICD-10-CM | POA: Insufficient documentation

## 2013-03-14 NOTE — Progress Notes (Signed)
03/14/2013 Charles Terry   [birth year]  [id number]  Primary Physicia Londell Moh, MD Primary Cardiologist: Dr Herbie Baltimore.   HPI:  Pleasant 79 y/o with a history of CABG X 5 in '05 with LIMA-LAD, SVG-OM1-OM2, SVG-PDA-PLB. He had a low risk Myoview in May 2012 and an echo in May 2012. He has AOv sclerosis and an EF of 45%. In May 2012 he had a hernia repair complicated by post op DVT RLE. He was treated with Coumadin. He had problems with urinary retention and work revealed BPH. He had an IVC filter placed 05/05/11 electively followed by prostate surgery 05/09/11. The filter was not removed. He came to the office today after he had an episode of "sharp", rt sided chest pain while sweeping. This lasted "seconds". 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 He denies any angina. He denies SOB or hemoptysis. He says the only problem he has is "my putting game".   Current Outpatient Prescriptions  Medication Sig Dispense Refill  . clopidogrel (PLAVIX) 75 MG tablet Take 75 mg by mouth daily.      [name] dutasteride (AVODART) 0.5 MG capsule Take 0.5 mg by mouth daily.      . fish oil-omega-3 fatty acids 1000 MG capsule Take 1 g by mouth daily.      . metoprolol tartrate (LOPRESSOR) 25 MG tablet Take 12.5 mg by mouth 2 (two) times daily.       . Multiple Vitamins-Minerals (MULTIVITAMINS THER. W/MINERALS) TABS Take 1 tablet by mouth daily.       . simvastatin (ZOCOR) 40 MG tablet Take 20 mg by mouth at bedtime.       [name] telmisartan (MICARDIS) 40 MG tablet Take 40 mg by mouth daily.       No current facility-administered medications for this visit.    Allergies  Allergen Reactions  . Aspirin Other (See Comments)    Unknown   . Food Swelling    KIWI FRUIT. THROAT SWELLING    History   Social History  . Marital Status: Married    Spouse Name: N/A    Number of Children: N/A  . Years of Education: N/A    Occupational History  . Not on file.   Social History Main Topics  . Smoking status: Former Smoker -- 1.00 packs/day for 20 years  . Smokeless tobacco: Never Used  . Alcohol Use: 1 - 1.5 oz/week    2-3 drink(s) per week     Comment: OCCAS  . Drug Use: No  . Sexual Activity: Not on file   Other Topics Concern  . Not on file   Social History Narrative  . No narrative on file     Review of Systems: General: negative for chills, fever, night sweats or weight changes.  Cardiovascular: negative for chest pain, dyspnea on exertion, edema, orthopnea, palpitations, paroxysmal nocturnal dyspnea or shortness of breath Dermatological: negative for rash Respiratory: negative for cough or wheezing Urologic: negative for hematuria Abdominal: negative for nausea, vomiting, diarrhea, bright red blood per rectum, melena, or hematemesis Neurologic: negative for visual changes, syncope, or dizziness All other systems reviewed and are otherwise negative except as noted above.    Blood pressure 142/78, pulse 57, height 5\' 7"  (1.702 m), weight 172 lb 3.2 oz (78.109 kg).  General appearance: alert, cooperative and no distress Neck: no carotid bruit and no JVD Lungs: few basilar crackles on Lt Heart: regular rate  and rhythm and 2/6 systolic murmur Aov Extremities: No edema, no calf pain  EKG NSR, incomplete RBBB  ASSESSMENT AND PLAN:   Chest pain, atypical Sharp, brief, Rt sided chest pain.   CABG X 5 '05. Low risk Nuc 5/12 No anginal symptoms  S/P IVC filter - placed electively prior to prostate surgery 11/12 .  DVT (deep venous thrombosis) RLE after hernia repair 5/12 .  BPH (benign prostatic hypertrophy) with urinary obstruction-surg 11/12 .  Aortic valve sclerosis .   PLAN  I reassured Mr Charles Terry that I did not think the IVC was causing him any problems. I suggested that his symptoms were muscular skeletal. I did not change his medications. He will keep his appointment with  Dr Herbie Baltimore in January. Unfortunately I was unable to help him with his short game.  Arhianna Ebey KPA-C 03/14/2013 9:28 AM

## 2013-03-14 NOTE — Assessment & Plan Note (Signed)
Sharp, brief, Rt sided chest pain.

## 2013-03-14 NOTE — Assessment & Plan Note (Signed)
No anginal symptoms

## 2013-03-14 NOTE — Patient Instructions (Addendum)
Your physician recommends that you schedule a follow-up appointment in January with Dr Herbie Baltimore.

## 2013-03-29 ENCOUNTER — Encounter: Payer: Self-pay | Admitting: Cardiology

## 2013-07-23 ENCOUNTER — Encounter: Payer: Self-pay | Admitting: Cardiology

## 2013-07-23 ENCOUNTER — Ambulatory Visit (INDEPENDENT_AMBULATORY_CARE_PROVIDER_SITE_OTHER): Payer: Medicare Other | Admitting: Cardiology

## 2013-07-23 VITALS — BP 120/70 | HR 53 | Ht 67.0 in | Wt 178.4 lb

## 2013-07-23 DIAGNOSIS — E785 Hyperlipidemia, unspecified: Secondary | ICD-10-CM

## 2013-07-23 DIAGNOSIS — Z951 Presence of aortocoronary bypass graft: Secondary | ICD-10-CM

## 2013-07-23 DIAGNOSIS — Z9889 Other specified postprocedural states: Secondary | ICD-10-CM

## 2013-07-23 DIAGNOSIS — I2510 Atherosclerotic heart disease of native coronary artery without angina pectoris: Secondary | ICD-10-CM

## 2013-07-23 DIAGNOSIS — Z95828 Presence of other vascular implants and grafts: Secondary | ICD-10-CM

## 2013-07-23 DIAGNOSIS — I10 Essential (primary) hypertension: Secondary | ICD-10-CM

## 2013-07-23 MED ORDER — CLOPIDOGREL BISULFATE 75 MG PO TABS: 75.0000 mg | ORAL_TABLET | ORAL | Status: AC

## 2013-07-23 NOTE — Patient Instructions (Signed)
Plavix  75 mg  Take every other day.   Your physician has requested that you have en exercise stress myoview. For further information please visit [email or web address]. Please follow instruction sheet, as given.--- Jan 2016  Your physician wants you to follow-up in 12 months FEB 2016.  You will receive a reminder letter in the mail two months in advance. If you don't receive a letter, please call our office to schedule the follow-up appointment.

## 2013-07-25 ENCOUNTER — Encounter: Payer: Self-pay | Admitting: Cardiology

## 2013-07-25 DIAGNOSIS — I2510 Atherosclerotic heart disease of native coronary artery without angina pectoris: Secondary | ICD-10-CM | POA: Insufficient documentation

## 2013-07-25 NOTE — Assessment & Plan Note (Addendum)
Very stable with no active symptoms. He is on low-dose beta blocker taking: Milligrams twice a day of metoprolol. He is also on an ARB. There was mention that he was switched to losartan from Micardis do to insurance company recommendations.  He is not aspirin D2 intolerance. Therefore he is on Plavix. I do not know what to make of the PharmD analysis results. He is not on this medication for stents, but is on in lieu of aspirin. He has not had any bleeding complications. On this report does not talus is the effectiveness of the medication itself on platelet inhibition. If there was question and the consideration that convert medications, I would check a P2Y12 assay.  Due to concern for possible hypermetabolism with increased effectiveness, I suggested that he could potentially take Plavix every other day. I don't think this will decrease the effectiveness.

## 2013-07-25 NOTE — Assessment & Plan Note (Signed)
Not on prophylactic anticoagulation.

## 2013-07-25 NOTE — Assessment & Plan Note (Signed)
Last evaluation for ischemia/graft patency was May 2012, by next year, he should be due for roughly 4 years followup Myoview. I will order A Treadmill Myoview to be performed prior to his next visit.

## 2013-07-25 NOTE — Assessment & Plan Note (Signed)
He tells me that he has not had any further evaluation for this graft. This was performed by Dr. Donnetta Hutching, and there has not been a routine assessment for several years. I will check with Dr. Donnetta Hutching, to determine if followup scan/ultrasound would be recommended.[name]

## 2013-07-25 NOTE — Assessment & Plan Note (Signed)
On simvastatin. Followed by Dr. Concha Pyo.

## 2013-07-25 NOTE — Assessment & Plan Note (Signed)
Very well controlled on current regimen. He is at a very low dose of beta blocker in the form of metoprolol. Therefore I am not overly concerned with the results showing poor outlining of CYP 450-2D6.  This may actually be the reason why he can only tolerate low dose. He has heart rate is 53, which is essentially stable from last few times has been seen here. I did get back off of the dose from 25 mg twice a day to 12.5 L twice a day in the past

## 2013-07-25 NOTE — Progress Notes (Signed)
PATIENT: Charles Terry MRN: [id number]  DOB: 20-Jan-1928   DOV:07/25/2013 PCP: Horatio Pel, MD  Clinic Note: Chief Complaint  Patient presents with  . Annual Exam    chest pain , no sob ,no edema   HPI: Charles Terry is a 80 y.o.  male with a PMH below who presents today for followup for his coronary disease. As you recall is a very pleasant gentleman with a history of CABG x5 in 2005 (LIMA-LAD, sequential SVG-OM1-OM 2, rectal SVG-RPDA-LPL) in response to a catheterization showing 100% LAD occlusion, 90% proximal circumflex - 80-90% OM1 as well as totally occluded RCA. His most recent stress test was in May 2012 which was normal. An echocardiogram at that time as well that showed an EF of 45-50% with anterior hypokinesis. Mild aortic valve sclerosis. He also has a history of AAA repair with a Hemashield graft 14 limited millimeters in 2006 by Dr. Donnetta Hutching.  Interval History: Charles Terry presents today, as usual but quite well. He says he tries to walk at least 1 mile a day sometimes he'll do more than that. He has no active complaints of chest tightness or pressure with rest or exertion. No dyspnea at rest or with exertion. No PND, orthopnea or edema. No lightheadedness, dizziness, weakness or syncope/near syncope. No TIA/amaurosis fugax symptoms. No melena, hematochezia hematuria. No claudication.  He has been able to do very well with weight loss, has gained about 6 pounds back since September, but remains 8 pounds less than he was last year.  Past Medical History  Diagnosis Date  . History of AAA (abdominal aortic aneurysm) repair 2006    AAA R&G Hemasheild 14 mm x 10 mm (Dr. Donnetta Hutching)  . History of blood clots     right lower extremity dvt  after inguinal hernia surgery may 2012--doppler follow up report 04/07/11 at [facility] heart and vaxcular shows resolution of the dvt--but pt is to have IVC filter placed 05/05/11--prior to planned prostate surgery scheduled for 05/09/11.  .  Coronary artery disease 2005    CABG '05. low risk Nuc 5/12  . Hypertension   . Blood transfusion MAY 2012  . Renal cyst may 2012    bleeding from right renal cyst /acute renal failure secondary to urinary retention from bph --pt states  the cyst is no longer a problem--pt still has indwelling foley catheter--plans  open  suprapubic prostatectomy on 11/19 with dr. Gaynelle Arabian  . BPH (benign prostatic hypertrophy) with urinary obstruction 11/12    surg  . History of hematuria   . S/P IVC filter 05/05/11    prior to prostate surgery  . Dyslipidemia   . DVT (deep venous thrombosis) 5/12    after hernia repair    Prior Cardiac Evaluation and Past Surgical History: Past Surgical History  Procedure Laterality Date  . Aaa rpair  03/29/2005    14x87mm Hemashield graft  . Hernia repair Left     MAY 2012  . Prostatectomy  05/09/2011    Procedure: PROSTATECTOMY SUPRAPUBIC;  Surgeon: Ailene Rud, MD;  Location: [facility];  Service: Urology;  Laterality: N/A;  Open  . Venous doppler  04/04/2011    No evidence of thrombus or thrombophlebitis  . Cardiac catheterization  12/23/2003    Recommended CABG; 100% LAD, 90% proximal circumflex followed by 80% OM1, 100% RCA. Right PDA and left DL in a codominant system.  . Coronary artery bypass graft  12/25/2003    x5, LIMA-LAD, SeqSVG-OM1-OM2, Seq SVG- RPDA-LPL  .  Persantine myoview  10/22/2010    Normal perfusion  . 2d echocardiogram  11/02/2010    EF 45-50%, w/ anterior hypokinesis, otherwise normal; mildly sclerotic aortic valve  . Ivc  11/12    placed prior to prostate surg    Allergies  Allergen Reactions  . Aspirin Other (See Comments)    Unknown   . Food Swelling    KIWI FRUIT. THROAT SWELLING    Current Outpatient Prescriptions  Medication Sig Dispense Refill  . Calcium Carbonate (CALTRATE 600 PO) Take by mouth.      . Cholecalciferol (D3-1000 PO) Take by mouth.      . clopidogrel (PLAVIX) 75 MG tablet Take 1 tablet (75 mg total) by  mouth every other day.  90 tablet  3  . fish oil-omega-3 fatty acids 1000 MG capsule Take 1 g by mouth daily.      [name] losartan (COZAAR) 100 MG tablet Take 50 mg by mouth daily.       . metoprolol tartrate (LOPRESSOR) 25 MG tablet Take 12.5 mg by mouth 2 (two) times daily.       . Multiple Vitamins-Minerals (MULTIVITAMINS THER. W/MINERALS) TABS Take 1 tablet by mouth daily.       . simvastatin (ZOCOR) 40 MG tablet Take 20 mg by mouth at bedtime.       . tamsulosin (FLOMAX) 0.4 MG CAPS capsule Take 0.4 mg by mouth daily after supper.        No current facility-administered medications for this visit.    History   Social History Narrative   Pleasant, married father of 2, grandfather 48. Exercises on a daily basis walking at least 1 mile. Does not smoke - quit over 20 years ago. Drinks social alcoholic beverage.    ROS: A comprehensive Review of Systems - Negative except Mild arthralgias.  PHYSICAL EXAM BP 120/70  Pulse 53  Ht 5\' 7"  (1.702 m)  Wt 178 lb 6.4 oz (80.922 kg)  BMI 27.93 kg/m2 General appearance: alert, cooperative, appears stated age, no distress and Well-nourished, well-groomed answers questions appropriately. Neck: no adenopathy, no carotid bruit, no JVD and supple, symmetrical, trachea midline Lungs: clear to auscultation bilaterally, normal percussion bilaterally and Nonlabored, good air movement Heart: RRR, normal S1 and. Soft~2/6 SEM at RUSB--> carotids. No other M./R./G. Abdomen: soft, non-tender; bowel sounds normal; no masses,  no organomegaly Extremities: extremities normal, atraumatic, no cyanosis or edema Pulses: 2+ and symmetric Neurologic: Grossly normal  HYQ:MVHQIONGE today: Yes Rate: 53 , Rhythm: Sinus bradycardia with an incomplete right bundle-branch block;    Recent Labs: No labs, but he does have a PharmD Analysis Report given to him by Elliot Gault, [facility]  Plavix: CYP 450-2C19 - ultra rapid metabolizer; CYP 450-384 - normal; rapid activator of  medication - close monitoring recommended.  Metoprolol: CYP 450-2D6 - 4 metabolizer; indicating concern for bradycardia or course.  ASSESSMENT / PLAN: Overall quite stable from a cardiac standpoint. Very active with no cardiac symptoms.  CAD in native artery Very stable with no active symptoms. He is on low-dose beta blocker taking: Milligrams twice a day of metoprolol. He is also on an ARB. There was mention that he was switched to losartan from Micardis do to insurance company recommendations.  He is not aspirin D2 intolerance. Therefore he is on Plavix. I do not know what to make of the PharmD analysis results. He is not on this medication for stents, but is on in lieu of aspirin. He has not had any bleeding  complications. On this report does not talus is the effectiveness of the medication itself on platelet inhibition. If there was question and the consideration that convert medications, I would check a P2Y12 assay.  Due to concern for possible hypermetabolism with increased effectiveness, I suggested that he could potentially take Plavix every other day. I don't think this will decrease the effectiveness.   CABG X 5 '05. Low risk Nuc 5/12 Last evaluation for ischemia/graft patency was May 2012, by next year, he should be due for roughly 4 years followup Myoview. I will order A Treadmill Myoview to be performed prior to his next visit.  History of AAA (abdominal aortic aneurysm) repair-'06 He tells me that he has not had any further evaluation for this graft. This was performed by Dr. Donnetta Hutching, and there has not been a routine assessment for several years. I will check with Dr. Donnetta Hutching, to determine if followup scan/ultrasound would be recommended..  Hypertension Very well controlled on current regimen. He is at a very low dose of beta blocker in the form of metoprolol. Therefore I am not overly concerned with the results showing poor outlining of CYP 450-2D6.  This may actually be the reason  why he can only tolerate low dose. He has heart rate is 53, which is essentially stable from last few times has been seen here. I did get back off of the dose from 25 mg twice a day to 12.5 L twice a day in the past  Dyslipidemia On simvastatin. Followed by Dr. Concha Pyo.  S/P IVC filter - placed electively prior to prostate surgery 11/12 Not on prophylactic anticoagulation.    Orders Placed This Encounter  Procedures  . Myocardial Perfusion Imaging    Standing Status: Future     Number of Occurrences:      Standing Expiration Date: 07/23/2014    Scheduling Instructions:     Please schedule in jan 2016    Order Specific Question:  Where should this test be performed    Answer:  [facility]    Order Specific Question:  Type of stress    Answer:  Exercise    Order Specific Question:  Patient weight in lbs    Answer:  178  . EKG 12-Lead    Followup: One year; stress test prior to followup  Briseyda Fehr W. Ellyn Hack, M.D., M.S. [facility] [address] [place], [place]  [address]  [phone] Pager # [phone]

## 2014-04-24 ENCOUNTER — Other Ambulatory Visit: Payer: Self-pay

## 2014-04-24 ENCOUNTER — Other Ambulatory Visit: Payer: Self-pay | Admitting: Cardiology

## 2014-04-24 NOTE — Telephone Encounter (Signed)
Rx was sent to pharmacy electronically. 

## 2014-04-24 NOTE — Telephone Encounter (Signed)
error 

## 2014-08-27 ENCOUNTER — Telehealth (HOSPITAL_COMMUNITY): Payer: Self-pay | Admitting: *Deleted

## 2014-10-27 ENCOUNTER — Other Ambulatory Visit: Payer: Self-pay | Admitting: Cardiology

## 2014-10-27 NOTE — Telephone Encounter (Signed)
Rx(s) sent to pharmacy electronically.  

## 2014-12-02 ENCOUNTER — Other Ambulatory Visit: Payer: Self-pay | Admitting: *Deleted

## 2014-12-02 ENCOUNTER — Telehealth: Payer: Self-pay | Admitting: Cardiology

## 2014-12-02 MED ORDER — LOSARTAN POTASSIUM 100 MG PO TABS: 50.0000 mg | ORAL_TABLET | Freq: Every day | ORAL | Status: DC

## 2014-12-02 NOTE — Telephone Encounter (Signed)
°  1. Which medications need to be refilled? Losartan-pt has an appointment 01-07-15  2. Which pharmacy is medication to be sent to?[facility]-[phone]  3. Do they need a 30 day or 90 day supply? 90 days and refills  4. Would they like a call back once the medication has been sent to the pharmacy? yes

## 2015-01-02 ENCOUNTER — Emergency Department (HOSPITAL_COMMUNITY)
Admission: EM | Admit: 2015-01-02 | Discharge: 2015-01-02 | Disposition: A | Discharge status: Home / Self Care | Payer: Medicare Other | Attending: Emergency Medicine | Admitting: Emergency Medicine

## 2015-01-02 ENCOUNTER — Emergency Department (HOSPITAL_COMMUNITY): Payer: Medicare Other

## 2015-01-02 ENCOUNTER — Encounter (HOSPITAL_COMMUNITY): Payer: Self-pay | Admitting: Emergency Medicine

## 2015-01-02 ENCOUNTER — Telehealth: Payer: Self-pay | Admitting: Cardiology

## 2015-01-02 DIAGNOSIS — Z87891 Personal history of nicotine dependence: Secondary | ICD-10-CM | POA: Insufficient documentation

## 2015-01-02 DIAGNOSIS — Z9889 Other specified postprocedural states: Secondary | ICD-10-CM | POA: Insufficient documentation

## 2015-01-02 DIAGNOSIS — Z86718 Personal history of other venous thrombosis and embolism: Secondary | ICD-10-CM | POA: Insufficient documentation

## 2015-01-02 DIAGNOSIS — E785 Hyperlipidemia, unspecified: Secondary | ICD-10-CM | POA: Insufficient documentation

## 2015-01-02 DIAGNOSIS — R079 Chest pain, unspecified: Secondary | ICD-10-CM | POA: Diagnosis present

## 2015-01-02 DIAGNOSIS — Z79899 Other long term (current) drug therapy: Secondary | ICD-10-CM | POA: Diagnosis not present

## 2015-01-02 DIAGNOSIS — Q6100 Congenital renal cyst, unspecified: Secondary | ICD-10-CM | POA: Insufficient documentation

## 2015-01-02 DIAGNOSIS — I2510 Atherosclerotic heart disease of native coronary artery without angina pectoris: Secondary | ICD-10-CM | POA: Insufficient documentation

## 2015-01-02 DIAGNOSIS — Z87438 Personal history of other diseases of male genital organs: Secondary | ICD-10-CM | POA: Insufficient documentation

## 2015-01-02 DIAGNOSIS — I10 Essential (primary) hypertension: Secondary | ICD-10-CM | POA: Diagnosis not present

## 2015-01-02 LAB — CBC
HCT: 42.8 % (ref 39.0–52.0)
HEMOGLOBIN: 14.7 g/dL (ref 13.0–17.0)
MCH: 32.5 pg (ref 26.0–34.0)
MCHC: 34.3 g/dL (ref 30.0–36.0)
MCV: 94.5 fL (ref 78.0–100.0)
PLATELETS: 145 10*3/uL — AB (ref 150–400)
RBC: 4.53 MIL/uL (ref 4.22–5.81)
RDW: 14.2 % (ref 11.5–15.5)
WBC: 8 10*3/uL (ref 4.0–10.5)

## 2015-01-02 LAB — BASIC METABOLIC PANEL
ANION GAP: 8 (ref 5–15)
BUN: 18 mg/dL (ref 6–20)
CALCIUM: 9.5 mg/dL (ref 8.9–10.3)
CO2: 28 mmol/L (ref 22–32)
Chloride: 103 mmol/L (ref 101–111)
Creatinine, Ser: 1.11 mg/dL (ref 0.61–1.24)
GFR calc Af Amer: >60 mL/min (ref >60)
GFR calc non Af Amer: 59 mL/min — ABNORMAL LOW (ref >60)
GLUCOSE: 175 mg/dL — AB (ref 65–99)
Potassium: 4.3 mmol/L (ref 3.5–5.1)
Sodium: 139 mmol/L (ref 135–145)

## 2015-01-02 LAB — I-STAT TROPONIN, ED: TROPONIN I, POC: 0 ng/mL (ref 0.00–0.08)

## 2015-01-02 NOTE — ED Notes (Signed)
The patient said he started having chest pain two days ago.  He says it is intermittent and happens only when he walks up the stairs.  The patient rates her pain 8/10.  He denies any other symptoms with the chest pain.

## 2015-01-02 NOTE — ED Notes (Signed)
Pt A&OX4, ambulatory at d/c with steady gait, NAD 

## 2015-01-02 NOTE — Telephone Encounter (Signed)
Pt began explaining that he is having CP ~2-3 days, states this is mainly w/ exertion (walking up steps, etc).   He notes it is occurring midsternally on right side. He makes association w/ IVC filter being, from his understanding, at this site.  Notes the chest pain is occasional in nature, sharp, on right side, and w/o other symptoms. It resolves after brief duration.  He does not want to go to the ED for evaluation. Patient already has yearly OV scheduled w/ Dr. Ellyn Hack on 7/20.  Advised to go to ER if symptoms worse. O/w, bring this to Dr. Allison Quarry attention Tuesday so options can be discussed.  Pt adds that he is aware he may be due for stress test, but would not be able to perform treadmill test d/t recent problem w/ ankle.  Will route to Dr. Percival Spanish ([facility]) & Dr. Ellyn Hack.

## 2015-01-02 NOTE — Telephone Encounter (Signed)
Pt have been having chest pains in the last 2 days.A very sharp pain and happen quickly. This pain is where he thinks the filter is located,this filter was put in during 2012.Please call to advise,pt wants to know if he should come in.

## 2015-01-02 NOTE — ED Provider Notes (Signed)
CSN: [id number]     Arrival date & time 01/02/15  1745 History   First MD Initiated Contact with Patient 01/02/15 1758     Chief Complaint  Patient presents with  . Chest Pain    The patient said he started having chest pain two days ago.  He says it is intermittent and happens only when he walks up the stairs.     (Consider location/radiation/quality/duration/timing/severity/associated sxs/prior Treatment) Patient is a 81 y.o. male presenting with chest pain.  Chest Pain Pain location:  Substernal area Pain quality: sharp   Pain radiates to:  Does not radiate Pain radiates to the back: no   Pain severity:  Moderate Onset quality:  Gradual Duration:  2 days Timing:  Intermittent Progression:  Unchanged Chronicity:  New Context comment:  Ho CAD, prior AAA, IVC filter in place Relieved by:  Nothing Worsened by:  Exertion Associated symptoms: no abdominal pain, no fever and no shortness of breath     Past Medical History  Diagnosis Date  . History of AAA (abdominal aortic aneurysm) repair 2006    AAA R&G Hemasheild 14 mm x 10 mm (Dr. Donnetta Hutching)  . History of blood clots     right lower extremity dvt  after inguinal hernia surgery may 2012--doppler follow up report 04/07/11 at [facility] heart and vaxcular shows resolution of the dvt--but pt is to have IVC filter placed 05/05/11--prior to planned prostate surgery scheduled for 05/09/11.  . Coronary artery disease 2005    CABG '05. low risk Nuc 5/12  . Hypertension   . Blood transfusion MAY 2012  . Renal cyst may 2012    bleeding from right renal cyst /acute renal failure secondary to urinary retention from bph --pt states  the cyst is no longer a problem--pt still has indwelling foley catheter--plans  open  suprapubic prostatectomy on 11/19 with dr. Gaynelle Arabian  . BPH (benign prostatic hypertrophy) with urinary obstruction 11/12    surg  . History of hematuria   . S/P IVC filter 05/05/11    prior to prostate surgery  .  Dyslipidemia   . DVT (deep venous thrombosis) 5/12    after hernia repair   Past Surgical History  Procedure Laterality Date  . Aaa rpair  03/29/2005    14x47mm Hemashield graft  . Hernia repair Left     MAY 2012  . Prostatectomy  05/09/2011    Procedure: PROSTATECTOMY SUPRAPUBIC;  Surgeon: Ailene Rud, MD;  Location: [facility];  Service: Urology;  Laterality: N/A;  Open  . Venous doppler  04/04/2011    No evidence of thrombus or thrombophlebitis  . Cardiac catheterization  12/23/2003    Recommended CABG; 100% LAD, 90% proximal circumflex followed by 80% OM1, 100% RCA. Right PDA and left DL in a codominant system.  . Coronary artery bypass graft  12/25/2003    x5, LIMA-LAD, SeqSVG-OM1-OM2, Seq SVG- RPDA-LPL  . Persantine myoview  10/22/2010    Normal perfusion  . 2d echocardiogram  11/02/2010    EF 45-50%, w/ anterior hypokinesis, otherwise normal; mildly sclerotic aortic valve  . Ivc  11/12    placed prior to prostate surg   Family History  Problem Relation Age of Onset  . Aneurysm Mother   . Cancer Father     Lung cancer  . Heart attack Brother   . Alzheimer's disease Brother    History  Substance Use Topics  . Smoking status: Former Smoker -- 1.00 packs/day for 20 years  . Smokeless tobacco:  Never Used  . Alcohol Use: 1.0 - 1.5 oz/week    2-3 drink(s) per week     Comment: OCCAS    Review of Systems  Constitutional: Negative for fever.  Respiratory: Negative for shortness of breath.   Cardiovascular: Positive for chest pain.  Gastrointestinal: Negative for abdominal pain.  All other systems reviewed and are negative.     Allergies  Aspirin and Food  Home Medications   Prior to Admission medications   Medication Sig Start Date End Date Taking? Authorizing Provider  Calcium Carbonate (CALTRATE 600 PO) Take 600 mg by mouth 2 (two) times daily.    Yes Historical Provider, MD  Cholecalciferol (D3-1000 PO) Take 1,000 Units by mouth daily.    Yes Historical  Provider, MD  clopidogrel (PLAVIX) 75 MG tablet Take 1 tablet (75 mg total) by mouth every other day. 07/23/13  Yes Leonie Man, MD  fish oil-omega-3 fatty acids 1000 MG capsule Take 1 g by mouth daily.   Yes Historical Provider, MD  losartan (COZAAR) 100 MG tablet Take 0.5 tablets (50 mg total) by mouth daily. NEEDS APPOINTMENT FOR FUTURE REFILLS 12/02/14  Yes Leonie Man, MD  metoprolol tartrate (LOPRESSOR) 25 MG tablet Take 12.5 mg by mouth 2 (two) times daily.    Yes Historical Provider, MD  Multiple Vitamins-Minerals (MULTIVITAMINS THER. W/MINERALS) TABS Take 1 tablet by mouth daily.    Yes Historical Provider, MD  simvastatin (ZOCOR) 40 MG tablet Take 20 mg by mouth at bedtime.    Yes Historical Provider, MD  tamsulosin (FLOMAX) 0.4 MG CAPS capsule Take 0.4 mg by mouth daily after supper.  07/13/13  Yes Historical Provider, MD   BP 104/34 mmHg  Pulse 52  Resp 19  SpO2 95% Physical Exam  Constitutional: He is oriented to person, place, and time. He appears well-developed and well-nourished.  HENT:  Head: Normocephalic and atraumatic.  Eyes: Conjunctivae and EOM are normal.  Neck: Normal range of motion. Neck supple.  Cardiovascular: Normal rate, regular rhythm and normal heart sounds.   Pulmonary/Chest: Effort normal and breath sounds normal. No respiratory distress.  Abdominal: He exhibits no distension. There is no tenderness. There is no rebound and no guarding.  Musculoskeletal: Normal range of motion.  Neurological: He is alert and oriented to person, place, and time.  Skin: Skin is warm and dry.  Vitals reviewed.   ED Course  Procedures (including critical care time) Labs Review Labs Reviewed  BASIC METABOLIC PANEL - Abnormal; Notable for the following:    Glucose, Bld 175 (*)    GFR calc non Af Amer 59 (*)    All other components within normal limits  CBC - Abnormal; Notable for the following:    Platelets 145 (*)    All other components within normal limits   I-STAT TROPOININ, ED    Imaging Review Dg Chest 2 View  01/02/2015   CLINICAL DATA:  81 year old male with chest pain x2 days  EXAM: CHEST  2 VIEW  COMPARISON:  None.  FINDINGS: Median sternotomy wires and CABG surgical clips noted. The heart size and mediastinal contours are within normal limits. Both lungs are clear. The visualized skeletal structures are unremarkable.  IMPRESSION: No active cardiopulmonary disease.   Electronically Signed   By: Anner Crete M.D.   On: 01/02/2015 19:35     EKG Interpretation   Date/Time:  Friday January 02 2015 17:49:19 EDT Ventricular Rate:  62 PR Interval:  202 QRS Duration: 98 QT Interval:  420 QTC Calculation: 426 R Axis:   -16 Text Interpretation:  Normal sinus rhythm Incomplete right bundle branch  block Possible Anterior infarct , age undetermined Abnormal ECG No  significant change since last tracing Confirmed by Debby Freiberg [phone])  on 01/02/2015 6:08:08 PM      MDM   Final diagnoses:  Chest pain, unspecified chest pain type    81 y.o. male with pertinent PMH of CAD sp CABG, AAA with endograft repair, IVC filter with prior DVT,  presents with chest pain as above.  Exam benign.  Wu with negative trop, no new symptoms in 6 hours.  Consulted cardiology who feel pt is stable to dc home with cards fu next week.  Pt persistently pain free in dept.  DC home in stable condition  I have reviewed all laboratory and imaging studies if ordered as above  1. Chest pain, unspecified chest pain type         Debby Freiberg, MD 01/02/15 [phone]

## 2015-01-02 NOTE — Telephone Encounter (Signed)
The patient should again be instructed to go to the ED with new onset exertional chest pain.

## 2015-01-02 NOTE — ED Notes (Signed)
This RN went into room to check on patient and pt politely states that he is ready to go home so he can watch the [facility]. Dr. Colin Rhein informed.

## 2015-01-02 NOTE — Discharge Instructions (Signed)

## 2015-01-02 NOTE — Telephone Encounter (Signed)
Pt instructed to go to ER, voiced understanding - plans present to [facility] ED for eval.

## 2015-01-02 NOTE — Telephone Encounter (Signed)
Outgoing call, no answer, VM pickup. Left message requesting pt call back.

## 2015-01-02 NOTE — Consult Note (Signed)
Referring Physician: ER -- Dr. Colin Rhein  Primary Cardiologist: Dr. Glenetta Hew  Reason for Consultation: CP   HPI: Pleasant 81 yo [place] gentleman with a history of CABG x5 in 2005 (LIMA-LAD, sequential SVG-OM1-OM 2, SVG-RPDA-LPL) in response to a catheterization showing 100% LAD occlusion, 90% proximal circumflex - 80-90% OM1 as well as totally occluded RCA. He also has a history of AAA repair with a Hemashield graft 14 limited millimeters in 2006 by Dr. Donnetta Hutching. He reports that for the past few days he has noted sharp brief pain at a point near right lower ches/upper abdominal area when he goes up and down the stairs. Lasts few seconds only. No other associated symptoms. No fever, chills, N/V, bleeding, angina, dyspnea, diaphoresis, syncope. He initially thought that it might be coming from his IVC filter but now feels that he may have pulled a muscle while playing golf. He has his annual appointment with Dr. Ellyn Hack on 01/07/15 and he wanted to wait but his son insisted that he call us and when he did he was asked to come to ER.    Review of Systems:  10 systems reviewed unremarkable except as noted in HPI     Past Medical History  Diagnosis Date  . History of AAA (abdominal aortic aneurysm) repair 2006    AAA R&G Hemasheild 14 mm x 10 mm (Dr. Donnetta Hutching)  . History of blood clots     right lower extremity dvt  after inguinal hernia surgery may 2012--doppler follow up report 04/07/11 at [facility] heart and vaxcular shows resolution of the dvt--but pt is to have IVC filter placed 05/05/11--prior to planned prostate surgery scheduled for 05/09/11.  . Coronary artery disease 2005    CABG '05. low risk Nuc 5/12  . Hypertension   . Blood transfusion MAY 2012  . Renal cyst may 2012    bleeding from right renal cyst /acute renal failure secondary to urinary retention from bph --pt states  the cyst is no longer a problem--pt still has indwelling foley catheter--plans  open  suprapubic  prostatectomy on 11/19 with dr. Gaynelle Arabian  . BPH (benign prostatic hypertrophy) with urinary obstruction 11/12    surg  . History of hematuria   . S/P IVC filter 05/05/11    prior to prostate surgery  . Dyslipidemia   . DVT (deep venous thrombosis) 5/12    after hernia repair     (Not in a hospital admission)     No current facility-administered medications on file prior to encounter.   Current Outpatient Prescriptions on File Prior to Encounter  Medication Sig Dispense Refill  . Calcium Carbonate (CALTRATE 600 PO) Take 600 mg by mouth 2 (two) times daily.     . Cholecalciferol (D3-1000 PO) Take 1,000 Units by mouth daily.     . clopidogrel (PLAVIX) 75 MG tablet Take 1 tablet (75 mg total) by mouth every other day. 90 tablet 3  . fish oil-omega-3 fatty acids 1000 MG capsule Take 1 g by mouth daily.    [name] losartan (COZAAR) 100 MG tablet Take 0.5 tablets (50 mg total) by mouth daily. NEEDS APPOINTMENT FOR FUTURE REFILLS 90 tablet 3  . metoprolol tartrate (LOPRESSOR) 25 MG tablet Take 12.5 mg by mouth 2 (two) times daily.     . Multiple Vitamins-Minerals (MULTIVITAMINS THER. W/MINERALS) TABS Take 1 tablet by mouth daily.     . simvastatin (ZOCOR) 40 MG tablet Take 20 mg by mouth at bedtime.     . tamsulosin (  FLOMAX) 0.4 MG CAPS capsule Take 0.4 mg by mouth daily after supper.         Allergies  Allergen Reactions  . Aspirin Other (See Comments)    Unknown   . Food Swelling    KIWI FRUIT. THROAT SWELLING    History   Social History  . Marital Status: Married    Spouse Name: N/A  . Number of Children: N/A  . Years of Education: N/A   Occupational History  . Not on file.   Social History Main Topics  . Smoking status: Former Smoker -- 1.00 packs/day for 20 years  . Smokeless tobacco: Never Used  . Alcohol Use: 1.0 - 1.5 oz/week    2-3 drink(s) per week     Comment: OCCAS  . Drug Use: No  . Sexual Activity: Not on file   Other Topics Concern  . Not on file    Social History Narrative   Pleasant, married father of 2, grandfather 77. Exercises on a daily basis walking at least 1 mile. Does not smoke - quit over 20 years ago. Drinks social alcoholic beverage.    Family History  Problem Relation Age of Onset  . Aneurysm Mother   . Cancer Father     Lung cancer  . Heart attack Brother   . Alzheimer's disease Brother     PHYSICAL EXAM: Filed Vitals:   01/02/15 2032  BP: 114/74  Pulse: 52  Resp: 20    No intake or output data in the 24 hours ending 01/02/15 2125  General:  Well appearing. No respiratory difficulty HEENT: normal Neck: supple. no JVD. Carotids 2+ bilat; no bruits. No lymphadenopathy or thryomegaly appreciated. Cor: PMI nondisplaced. Regular rate & rhythm. No rubs, gallops or murmurs. Lungs: clear There is a focal area of mild tenderness at the mid R subcostal line  Abdomen: soft, nontender, nondistended. No hepatosplenomegaly. No bruits or masses. Good bowel sounds. Extremities: no cyanosis, clubbing, rash, edema Neuro: alert & oriented x 3, cranial nerves grossly intact. moves all 4 extremities w/o difficulty. Affect pleasant.  ECG: reviewed by me -- Sinus, nonspecific T wave changes.   Results for orders placed or performed during the hospital encounter of 01/02/15 (from the past 24 hour(s))  Basic metabolic panel     Status: Abnormal   Collection Time: 01/02/15  5:58 PM  Result Value Ref Range   Sodium 139 135 - 145 mmol/L   Potassium 4.3 3.5 - 5.1 mmol/L   Chloride 103 101 - 111 mmol/L   CO2 28 22 - 32 mmol/L   Glucose, Bld 175 (H) 65 - 99 mg/dL   BUN 18 6 - 20 mg/dL   Creatinine, Ser 1.11 0.61 - 1.24 mg/dL   Calcium 9.5 8.9 - 10.3 mg/dL   GFR calc non Af Amer 59 (L) >60 mL/min   GFR calc Af Amer >60 >60 mL/min   Anion gap 8 5 - 15  CBC     Status: Abnormal   Collection Time: 01/02/15  5:58 PM  Result Value Ref Range   WBC 8.0 4.0 - 10.5 K/uL   RBC 4.53 4.22 - 5.81 MIL/uL   Hemoglobin 14.7 13.0 - 17.0  g/dL   HCT 42.8 39.0 - 52.0 %   MCV 94.5 78.0 - 100.0 fL   MCH 32.5 26.0 - 34.0 pg   MCHC 34.3 30.0 - 36.0 g/dL   RDW 14.2 11.5 - 15.5 %   Platelets 145 (L) 150 - 400 K/uL  I-stat  troponin, ED     Status: None   Collection Time: 01/02/15  6:22 PM  Result Value Ref Range   Troponin i, poc 0.00 0.00 - 0.08 ng/mL   Comment 3           Dg Chest 2 View  01/02/2015   CLINICAL DATA:  81 year old male with chest pain x2 days  EXAM: CHEST  2 VIEW  COMPARISON:  None.  FINDINGS: Median sternotomy wires and CABG surgical clips noted. The heart size and mediastinal contours are within normal limits. Both lungs are clear. The visualized skeletal structures are unremarkable.  IMPRESSION: No active cardiopulmonary disease.   Electronically Signed   By: Anner Crete M.D.   On: 01/02/2015 19:35     ASSESSMENT:  1. Non-cardiac right subcostal focal pain on using stairs - no evidence of ACS by presentation, ECG and initial Trop - BP is normal    PLAN/DISCUSSION:  - Patient can be discharged home from the ER. No additional cardiac work up is needed at this time.  - He already has an appointment to see his cardiologist Dr. Ellyn Hack on 01/07/15 which he is going to keep.  - I have recommended that he takes his Plavix once a day instead of every other day. He is allergic to Aspirin.   Plan discussed with patient and his wife who are in full agreement.   Discussed with ER attending Dr. Colin Rhein.  Thanks for the consult.    Wandra Mannan, MD Cardiology

## 2015-01-07 ENCOUNTER — Telehealth: Payer: Self-pay | Admitting: Cardiology

## 2015-01-07 ENCOUNTER — Ambulatory Visit: Payer: Self-pay | Admitting: Cardiology

## 2015-01-08 NOTE — Telephone Encounter (Signed)
Close encounter 

## 2015-01-12 ENCOUNTER — Ambulatory Visit (INDEPENDENT_AMBULATORY_CARE_PROVIDER_SITE_OTHER): Payer: Medicare Other | Admitting: Cardiology

## 2015-01-12 ENCOUNTER — Encounter: Payer: Self-pay | Admitting: Cardiology

## 2015-01-12 VITALS — BP 128/66 | HR 51 | Ht 66.0 in | Wt 176.1 lb

## 2015-01-12 DIAGNOSIS — I10 Essential (primary) hypertension: Secondary | ICD-10-CM

## 2015-01-12 DIAGNOSIS — R0789 Other chest pain: Secondary | ICD-10-CM

## 2015-01-12 DIAGNOSIS — Z951 Presence of aortocoronary bypass graft: Secondary | ICD-10-CM

## 2015-01-12 DIAGNOSIS — E785 Hyperlipidemia, unspecified: Secondary | ICD-10-CM

## 2015-01-12 DIAGNOSIS — I358 Other nonrheumatic aortic valve disorders: Secondary | ICD-10-CM | POA: Diagnosis not present

## 2015-01-12 DIAGNOSIS — I82409 Acute embolism and thrombosis of unspecified deep veins of unspecified lower extremity: Secondary | ICD-10-CM | POA: Diagnosis not present

## 2015-01-12 DIAGNOSIS — I2510 Atherosclerotic heart disease of native coronary artery without angina pectoris: Secondary | ICD-10-CM | POA: Diagnosis not present

## 2015-01-12 NOTE — Patient Instructions (Addendum)
NO CHANGE IN CURRENT MEDICATIONS  CALL THE OFFICE WHEN YOU ARE READY TO SCHEDULE EXERCISE MYOVIEW.-( AFTER ANKLE HEALS)  Your physician wants you to follow-up in [place].  You will receive a reminder letter in the mail two months in advance. If you don't receive a letter, please call our office to schedule the follow-up appointment.

## 2015-01-12 NOTE — Progress Notes (Signed)
PCP: Horatio Pel, MD  Clinic Note: Chief Complaint  Patient presents with  . Follow-up    chest pain-has had, no shortness of breath, no edema, no pain in legs,  no cramping in legs, no lightheadedness,  nodizziness    HPI: Charles Terry is a 81 y.o. male with a PMH below who presents today for early six-month follow up an ER followup for chest pain. As you recall is a very pleasant gentleman with a history of CABG x5 in 2005 (LIMA-LAD, sequential SVG-OM1-OM 2, rectal SVG-RPDA-LPL) in response to a catheterization showing 100% LAD occlusion, 90% proximal circumflex - 80-90% OM1 as well as totally occluded RCA. His most recent stress test was in May 2012 which was normal. An echocardiogram at that time as well that showed an EF of 45-50% with anterior hypokinesis. Mild aortic valve sclerosis. He also has a history of AAA repair with a Hemashield graft 14 limited millimeters in 2006 by Dr. Donnetta Hutching.   Interestingly, his coronary disease was found surreptitiously based on baseline EKG assessment that was thought to be abnormal. As a result he had a nuclear stress test that showed ischemia and therefore was referred for catheterization revealing multivessel CAD. The patient himself never recalled ever having any symptoms of chest tightness pressure or dyspnea related to CAD.  Charles Terry was last seen on 07/23/2013 -- at that time he was relatively stable cardiac standpoint.the intention was for him to have a treadmill Myoview prior to his February 2016 visit that never occurred.[name] Unfortunately, he injured his ankle and has been unable to walk on treadmill. He was reluctant to do [place] and therefore basically canceled the study.  Interval History:   Recent Hospitalizations: he presented to the [facility] ER on July 15 with complaints of chest pain.he was seen in consultation by the cardiology fellow. He noted a sharp pain along the right side of his chest and the upper bowel area  over last few days prior to going to the ER. He claimed noting it going downstairs. These episodes last only a few seconds but otherwise not associated with any dyspnea or pressure. He felt like he may have pulled a muscle, however his family insisted that he contact the on-call cardiologist and was recommended to come to the ER for evaluation. Evaluation in the ER was felt to be non-ACS/cardiac type symptoms and most likely musculoskeletal symptoms. He was discharged in the emergency room in stable condition.  Studies Reviewed: no recent studies  He presents today indication he is not really had any more of this chest discomfort.Other than the somewhat atypical right-sided chest discomfort that is not always present with exertion, he really does not have any sensation of chest heaviness or pressure with exertion. He describes more sharp discomfort. He also denies any associated dyspnea with rest or exertion. No particular symptoms of PND, orthopnea or edema. He has not been as active as he used to be, mostly because of the injury to his right ankle.  It still limits in the amount of exercise he can do. Otherwise really doing relatively well. No symptoms of near syncope or TIA/RCS. No melena, hematochezia or hematuria.  Past Medical History  Diagnosis Date  . History of AAA (abdominal aortic aneurysm) repair 2006    AAA R&G Hemasheild 14 mm x 10 mm (Dr. Donnetta Hutching)  . History of blood clots     right lower extremity dvt  after inguinal hernia surgery may 2012--doppler follow up report 04/07/11  at [facility] heart and vaxcular shows resolution of the dvt--but pt is to have IVC filter placed 05/05/11--prior to planned prostate surgery scheduled for 05/09/11.  . Coronary artery disease 2005    CABG '05. low risk Nuc 5/12  . Hypertension   . Blood transfusion MAY 2012  . Renal cyst may 2012    bleeding from right renal cyst /acute renal failure secondary to urinary retention from bph --pt states  the  cyst is no longer a problem--pt still has indwelling foley catheter--plans  open  suprapubic prostatectomy on 11/19 with dr. Gaynelle Arabian  . BPH (benign prostatic hypertrophy) with urinary obstruction 11/12    surg  . History of hematuria   . S/P IVC filter 05/05/11    prior to prostate surgery  . Dyslipidemia   . DVT (deep venous thrombosis) 5/12    after hernia repair    Past Surgical History  Procedure Laterality Date  . Aaa rpair  03/29/2005    14x32mm Hemashield graft  . Hernia repair Left     MAY 2012  . Prostatectomy  05/09/2011    Procedure: PROSTATECTOMY SUPRAPUBIC;  Surgeon: Ailene Rud, MD;  Location: [facility] ORS;  Service: Urology;  Laterality: N/A;  Open  . Venous doppler  04/04/2011    No evidence of thrombus or thrombophlebitis  . Cardiac catheterization  12/23/2003    Recommended CABG; 100% LAD, 90% proximal circumflex followed by 80% OM1, 100% RCA. Right PDA and left DL in a codominant system.  . Coronary artery bypass graft  12/25/2003    x5, LIMA-LAD, SeqSVG-OM1-OM2, Seq SVG- RPDA-LPL  . Persantine myoview  10/22/2010    Normal perfusion  . 2d echocardiogram  11/02/2010    EF 45-50%, w/ anterior hypokinesis, otherwise normal; mildly sclerotic aortic valve  . Ivc  11/12    placed prior to prostate surg    ROS: A comprehensive was performed. Review of Systems  Constitutional: Negative for malaise/fatigue.  HENT: Positive for nosebleeds.   Respiratory: Negative for shortness of breath and wheezing.   Cardiovascular: Negative for claudication.       Per history of present illness  Gastrointestinal: Negative for blood in stool and melena.  Genitourinary: Negative for hematuria.  Musculoskeletal: Positive for joint pain (Arthritis pain).  Endo/Heme/Allergies: Does not bruise/bleed easily.  All other systems reviewed and are negative.   Current Outpatient Prescriptions on File Prior to Visit  Medication Sig Dispense Refill  . Calcium Carbonate (CALTRATE 600 PO)  Take 600 mg by mouth 2 (two) times daily.     . Cholecalciferol (D3-1000 PO) Take 1,000 Units by mouth daily.     . clopidogrel (PLAVIX) 75 MG tablet Take 1 tablet (75 mg total) by mouth every other day. 90 tablet 3  . fish oil-omega-3 fatty acids 1000 MG capsule Take 1 g by mouth daily.    [name] losartan (COZAAR) 100 MG tablet Take 0.5 tablets (50 mg total) by mouth daily. NEEDS APPOINTMENT FOR FUTURE REFILLS 90 tablet 3  . metoprolol tartrate (LOPRESSOR) 25 MG tablet Take 12.5 mg by mouth 2 (two) times daily.     . Multiple Vitamins-Minerals (MULTIVITAMINS THER. W/MINERALS) TABS Take 1 tablet by mouth daily.     . simvastatin (ZOCOR) 40 MG tablet Take 20 mg by mouth at bedtime.     . tamsulosin (FLOMAX) 0.4 MG CAPS capsule Take 0.4 mg by mouth daily after supper.      No current facility-administered medications on file prior to visit.   Allergies  Allergen Reactions  . Aspirin Other (See Comments)    Unknown   . Food Swelling    KIWI FRUIT. THROAT SWELLING    History   Social History  . Marital Status: Married    Spouse Name: N/A  . Number of Children: N/A  . Years of Education: N/A   Occupational History  . Not on file.   Social History Main Topics  . Smoking status: Former Smoker -- 1.00 packs/day for 20 years  . Smokeless tobacco: Never Used  . Alcohol Use: 1.0 - 1.5 oz/week    2-3 drink(s) per week     Comment: OCCAS  . Drug Use: No  . Sexual Activity: Not on file   Other Topics Concern  . Not on file   Social History Narrative   Pleasant, married father of 2, grandfather 58. Exercises on a daily basis walking at least 1 mile. Does not smoke - quit over 20 years ago. Drinks social alcoholic beverage.   Family History  Problem Relation Age of Onset  . Aneurysm Mother   . Cancer Father     Lung cancer  . Heart attack Brother   . Alzheimer's disease Brother      Wt Readings from Last 3 Encounters:  01/12/15 79.861 kg (176 lb 1 oz)  07/23/13 80.922 kg (178 lb  6.4 oz)  03/14/13 78.109 kg (172 lb 3.2 oz)    PHYSICAL EXAM BP 128/66 mmHg  Pulse 51  Ht 5\' 6"  (1.676 m)  Wt 79.861 kg (176 lb 1 oz)  BMI 28.43 kg/m2 General appearance: alert, cooperative, appears stated age, no distress and Well-nourished, well-groomed answers questions appropriately. HEENT: [place]/AT, EOMI, MMM, anicteric sclera Neck: no adenopathy, no carotid bruit, no JVD and supple, symmetrical, trachea midline Lungs: clear to auscultation bilaterally, normal percussion bilaterally and Nonlabored, good air movement Heart: RRR, normal S1 and. Soft~2/6 SEM at RUSB--> carotids. No other M./R./G. Abdomen: soft, non-tender; bowel sounds normal; no masses, no organomegaly Extremities: extremities normal, atraumatic, no cyanosis or edema Pulses: 2+ and symmetric Neurologic: Grossly normal   Adult ECG Report  Rate: 51 ;  Rhythm: sinus bradycardia and Incomplete RBBB. Borderline low voltage.  Narrative Interpretation: stable EKG   Other studies Reviewed: Additional studies/ records that were reviewed today include:  Recent Labs:  Labs from July 15 hospitalization reviewed. Normal creatinine and platelets.   ASSESSMENT / PLAN: Problem List Items Addressed This Visit    Aortic valve sclerosis (Chronic)    Monitor for worsening murmur to suggest stenosis      Atherosclerosis of native coronary artery without angina pectoris - Primary (Chronic)    I agree with the assessment emergency room and then to assess symptoms are probably not related to angina. He is due to have a nuclear stress test for CABG followup anyway. He remains on Plavix which he continues to take every other day. Not on aspirin because of intolerance. He does take a statin, beta blocker and ARB. No bleeding complications with Plavix.      Chest pain, atypical    Chart right-sided pain that was also noted in 2014. This is a chronic thing for him to have and may be related to his cervical sternal wires. I don't think  is anginal/ischemic. He is due for Myoview soon to evaluate for ischemia,regardless      DVT (deep venous thrombosis) RLE after hernia repair 5/12    Status post IVC filter placement. Not on long-term anticoagulation. No sign of recurrent venous  thrombosis.      Dyslipidemia (Chronic)    On simvastatin. Also on omega-3 fatty acids. Monitored by PCP      Relevant Orders   EKG 12-Lead (Completed)   Essential hypertension (Chronic)    Well-controlled on current meds. No change      Relevant Orders   EKG 12-Lead (Completed)   S/P CABG x 5 (Chronic)    Due for followup Myoview. This was to be done this year. Hopefully once he is able to walk again he can get back on the treadmill to do the treadmill Monday. He'll contact us when his foot is ready.         Current medicines are reviewed at length with the patient today. (+/- concerns) n/a The following changes have been made: N/A  NO CHANGE IN CURRENT MEDICATIONS  CALL THE OFFICE WHEN YOU ARE READY TO SCHEDULE EXERCISE MYOVIEW.-( AFTER ANKLE HEALS)  Your physician wants you to follow-up in [place].   Leonie Man, M.D., M.S. Interventional Cardiologist   Pager # [phone]

## 2015-01-13 NOTE — Assessment & Plan Note (Signed)
Well-controlled on current meds.  No change 

## 2015-01-13 NOTE — Assessment & Plan Note (Signed)
I agree with the assessment emergency room and then to assess symptoms are probably not related to angina. He is due to have a nuclear stress test for CABG followup anyway. He remains on Plavix which he continues to take every other day. Not on aspirin because of intolerance. He does take a statin, beta blocker and ARB. No bleeding complications with Plavix.

## 2015-01-13 NOTE — Assessment & Plan Note (Signed)
Monitor for worsening murmur to suggest stenosis

## 2015-01-13 NOTE — Assessment & Plan Note (Signed)
Status post IVC filter placement. Not on long-term anticoagulation. No sign of recurrent venous thrombosis.

## 2015-01-13 NOTE — Assessment & Plan Note (Signed)
On simvastatin. Also on omega-3 fatty acids. Monitored by PCP

## 2015-01-13 NOTE — Assessment & Plan Note (Signed)
Chart right-sided pain that was also noted in 2014. This is a chronic thing for him to have and may be related to his cervical sternal wires. I don't think is anginal/ischemic. He is due for Myoview soon to evaluate for ischemia,regardless

## 2015-01-13 NOTE — Assessment & Plan Note (Signed)
Due for followup Myoview. This was to be done this year. Hopefully once he is able to walk again he can get back on the treadmill to do the treadmill Monday. He'll contact us when his foot is ready.

## 2015-08-27 DIAGNOSIS — E785 Hyperlipidemia, unspecified: Secondary | ICD-10-CM | POA: Diagnosis not present

## 2015-08-27 DIAGNOSIS — E119 Type 2 diabetes mellitus without complications: Secondary | ICD-10-CM | POA: Diagnosis not present

## 2015-08-27 DIAGNOSIS — I10 Essential (primary) hypertension: Secondary | ICD-10-CM | POA: Diagnosis not present

## 2015-09-01 DIAGNOSIS — I10 Essential (primary) hypertension: Secondary | ICD-10-CM | POA: Diagnosis not present

## 2015-09-01 DIAGNOSIS — E785 Hyperlipidemia, unspecified: Secondary | ICD-10-CM | POA: Diagnosis not present

## 2015-09-01 DIAGNOSIS — E119 Type 2 diabetes mellitus without complications: Secondary | ICD-10-CM | POA: Diagnosis not present

## 2015-10-07 DIAGNOSIS — R310 Gross hematuria: Secondary | ICD-10-CM | POA: Diagnosis not present

## 2015-10-07 DIAGNOSIS — Z0000 Encounter for general adult medical examination without abnormal findings: Secondary | ICD-10-CM | POA: Diagnosis not present

## 2015-10-09 DIAGNOSIS — N400 Enlarged prostate without lower urinary tract symptoms: Secondary | ICD-10-CM | POA: Diagnosis not present

## 2015-10-09 DIAGNOSIS — R310 Gross hematuria: Secondary | ICD-10-CM | POA: Diagnosis not present

## 2015-10-15 DIAGNOSIS — I712 Thoracic aortic aneurysm, without rupture: Secondary | ICD-10-CM | POA: Diagnosis not present

## 2015-10-22 ENCOUNTER — Encounter: Payer: Self-pay | Admitting: Vascular Surgery

## 2015-10-27 ENCOUNTER — Encounter: Payer: Self-pay | Admitting: Vascular Surgery

## 2015-10-27 ENCOUNTER — Ambulatory Visit (INDEPENDENT_AMBULATORY_CARE_PROVIDER_SITE_OTHER): Payer: Medicare Other | Admitting: Vascular Surgery

## 2015-10-27 VITALS — BP 138/74 | HR 56 | Temp 98.1°F | Resp 18 | Ht 67.0 in | Wt 174.9 lb

## 2015-10-27 DIAGNOSIS — I712 Thoracic aortic aneurysm, without rupture, unspecified: Secondary | ICD-10-CM

## 2015-10-27 NOTE — Progress Notes (Signed)
[facility] [place]  Patient name: Charles Terry MRN: [id number] DOB: [birth year] Sex: male  REASON FOR VISIT: Thoracic aortic aneurysm  HPI: Charles Terry is a 82 y.o. male, who presents for evaluation of a thoracic aortic aneurysm found incidentally on a CT scan of the abdomen. The patient is known to Dr. Debara Kamphuis from prior open AAA repair in 2006. The patient had a recent episode of hematuria and contacted his urologist. His urologist, Dr. Gaynelle Arabian, ordered an abdominal CT scan and a 4.8 cm thoracic aneurysm was found. He was then seen by his PCP, Dr. Pennie Banter PA who sent him here. The patient denies any chest pain or back pain. He is active and ambulates daily. He says he wants to live to be 82 years old.  He has a past medical history of DVT status post IVC filter placement. He had a CABG back in 2005. He denies any claudication symptoms. His hypertension is managed on a beta blocker. His hyperlipidemia is managed on a statin. He takes Plavix daily.  Past Medical History  Diagnosis Date  . History of AAA (abdominal aortic aneurysm) repair 2006    AAA R&G Hemasheild 14 mm x 10 mm (Dr. Donnetta Hutching)  . History of blood clots     right lower extremity dvt  after inguinal hernia surgery may 2012--doppler follow up report 04/07/11 at [facility] heart and vaxcular shows resolution of the dvt--but pt is to have IVC filter placed 05/05/11--prior to planned prostate surgery scheduled for 05/09/11.  . Coronary artery disease 2005    CABG '05. low risk Nuc 5/12  . Hypertension   . Blood transfusion MAY 2012  . Renal cyst may 2012    bleeding from right renal cyst /acute renal failure secondary to urinary retention from bph --pt states  the cyst is no longer a problem--pt still has indwelling foley catheter--plans  open  suprapubic prostatectomy on 11/19 with dr. Gaynelle Arabian  . BPH (benign prostatic hypertrophy) with urinary obstruction 11/12    surg  . History of hematuria    . S/P IVC filter 05/05/11    prior to prostate surgery  . Dyslipidemia   . DVT (deep venous thrombosis) ([place]) 5/12    after hernia repair    Family History  Problem Relation Age of Onset  . Aneurysm Mother   . Cancer Father     Lung cancer  . Heart attack Brother   . Alzheimer's disease Brother     SOCIAL HISTORY: Social History   Social History  . Marital Status: Married    Spouse Name: N/A  . Number of Children: N/A  . Years of Education: N/A   Occupational History  . Not on file.   Social History Main Topics  . Smoking status: Former Smoker -- 1.00 packs/day for 20 years  . Smokeless tobacco: Never Used  . Alcohol Use: 1.0 - 1.5 oz/week    2-3 drink(s) per week     Comment: OCCAS  . Drug Use: No  . Sexual Activity: Not on file   Other Topics Concern  . Not on file   Social History Narrative   Pleasant, married father of 2, grandfather 83. Exercises on a daily basis walking at least 1 mile. Does not smoke - quit over 20 years ago. Drinks social alcoholic beverage.    Allergies  Allergen Reactions  . Aspirin Other (See Comments)    Unknown   . Food Swelling    KIWI FRUIT. THROAT  SWELLING    Current Outpatient Prescriptions  Medication Sig Dispense Refill  . Calcium Carbonate (CALTRATE 600 PO) Take 600 mg by mouth 2 (two) times daily.     . Cholecalciferol (D3-1000 PO) Take 1,000 Units by mouth daily.     . clopidogrel (PLAVIX) 75 MG tablet Take 1 tablet (75 mg total) by mouth every other day. 90 tablet 3  . fish oil-omega-3 fatty acids 1000 MG capsule Take 1 g by mouth daily.    [name] losartan (COZAAR) 100 MG tablet Take 0.5 tablets (50 mg total) by mouth daily. NEEDS APPOINTMENT FOR FUTURE REFILLS 90 tablet 3  . metoprolol tartrate (LOPRESSOR) 25 MG tablet Take 12.5 mg by mouth 2 (two) times daily.     . Multiple Vitamins-Minerals (MULTIVITAMINS THER. W/MINERALS) TABS Take 1 tablet by mouth daily.     . simvastatin (ZOCOR) 40 MG tablet Take 20 mg by mouth  at bedtime.     . tamsulosin (FLOMAX) 0.4 MG CAPS capsule Take 0.4 mg by mouth daily after supper.      No current facility-administered medications for this visit.    REVIEW OF SYSTEMS:  [X]  denotes positive finding, [ ]  denotes negative finding Cardiac  Comments:  Chest pain or chest pressure:    Shortness of breath upon exertion:    Short of breath when lying flat:    Irregular heart rhythm:        Vascular    Pain in calf, thigh, or hip brought on by ambulation:    Pain in feet at night that wakes you up from your sleep:     Blood clot in your veins:    Leg swelling:         Pulmonary    Oxygen at home:    Productive cough:     Wheezing:         Neurologic    Sudden weakness in arms or legs:     Sudden numbness in arms or legs:     Sudden onset of difficulty speaking or slurred speech:    Temporary loss of vision in one eye:     Problems with dizziness:         Gastrointestinal    Blood in stool:     Vomited blood:         Genitourinary    Burning when urinating:     Blood in urine:        Psychiatric    Major depression:         Hematologic    Bleeding problems:    Problems with blood clotting too easily:        Skin    Rashes or ulcers:        Constitutional    Fever or chills:      PHYSICAL EXAM: Filed Vitals:   10/27/15 1448  BP: 138/74  Pulse: 56  Temp: 98.1 F (36.7 C)  TempSrc: Oral  Resp: 18  Height: 5\' 7"  (1.702 m)  Weight: 174 lb 14.4 oz (79.334 kg)  SpO2: 95%    GENERAL: The patient is a well-nourished male, in no acute distress. The vital signs are documented above. CARDIAC: There is a regular rate and rhythm. No carotid bruits. VASCULAR: 2+ radial pulses symmetric. Palpable femoral and pedal pulses bilaterally. PULMONARY: There is good air exchange bilaterally without wheezing or rales. ABDOMEN: Soft and non-tender.   MUSCULOSKELETAL: There are no major deformities or cyanosis. NEUROLOGIC: No focal weakness or paresthesias are  detected. SKIN: There are no ulcers or rashes noted. PSYCHIATRIC: The patient has a normal affect.  DATA:  CT abdomen from 10/19/15 reviewed revealing descending thoracic aortic aneurysm with maximal diameter 4.8 cm. The entire thoracic aorta was unable to be viewed due to abdominal scan.  MEDICAL ISSUES: Thoracic aortic aneurysm without rupture  The patient had a 4.8 cm thoracic aortic aneurysm seen on a recent CT of the abdomen. He has not had a CT of the chest. The patient has not had any chest pain or back pain. There are no indications for surgical intervention at this time. Discussed consideration of intervention at 6 cm. Discussed that the risk of rupture is minimal.Cannot make any surgical recommendations until the entirety of the thoracic aorta can be viewed. The patient is not interested in any open procedures.  Offered the patient a CTA of the chest now or in 6 months for further evaluation. The patient is okay with waiting 6 months to obtain a CTA of the chest. Discussed hypertension control. He has no activity restrictions. We will see him back in 6 months.   Virgina Jock, PA-C [facility] [place]   I have examined the patient, reviewed and agree with above. Patient well known to me from open aneurysm repair in 2006. He has done some extremely well. Discussed with the patient and his son and friend present. We'll see him again in 6 months for CT scan for full evaluation of his thoracic aneurysm. I would feel that he is at prohibitive risk for open aneurysm repair. Would be a candidate for stent graft repair of his thoracic aortic aneurysm if it continues to grow.  Curt Jews, MD 10/27/2015 4:06 PM

## 2015-10-29 ENCOUNTER — Encounter: Payer: Self-pay | Admitting: Urology

## 2015-11-13 NOTE — Addendum Note (Signed)
Addended by: Thresa Ross C on: 11/13/2015 12:00 PM   Modules accepted: Orders

## 2015-11-18 DIAGNOSIS — R310 Gross hematuria: Secondary | ICD-10-CM | POA: Diagnosis not present

## 2015-11-18 DIAGNOSIS — Z0000 Encounter for general adult medical examination without abnormal findings: Secondary | ICD-10-CM | POA: Diagnosis not present

## 2015-11-19 ENCOUNTER — Emergency Department (HOSPITAL_COMMUNITY): Payer: Medicare Other

## 2015-11-19 ENCOUNTER — Inpatient Hospital Stay (HOSPITAL_COMMUNITY)
Admission: EM | Admit: 2015-11-19 | Discharge: 2015-11-23 | DRG: 872 | Disposition: A | Discharge status: Home / Self Care | Payer: Medicare Other | Attending: Internal Medicine | Admitting: Internal Medicine

## 2015-11-19 ENCOUNTER — Encounter (HOSPITAL_COMMUNITY): Payer: Self-pay | Admitting: Emergency Medicine

## 2015-11-19 DIAGNOSIS — Z87891 Personal history of nicotine dependence: Secondary | ICD-10-CM

## 2015-11-19 DIAGNOSIS — N390 Urinary tract infection, site not specified: Secondary | ICD-10-CM | POA: Diagnosis not present

## 2015-11-19 DIAGNOSIS — A4152 Sepsis due to Pseudomonas: Principal | ICD-10-CM | POA: Diagnosis present

## 2015-11-19 DIAGNOSIS — Z95828 Presence of other vascular implants and grafts: Secondary | ICD-10-CM

## 2015-11-19 DIAGNOSIS — D696 Thrombocytopenia, unspecified: Secondary | ICD-10-CM | POA: Diagnosis present

## 2015-11-19 DIAGNOSIS — N138 Other obstructive and reflux uropathy: Secondary | ICD-10-CM | POA: Diagnosis present

## 2015-11-19 DIAGNOSIS — Z9889 Other specified postprocedural states: Secondary | ICD-10-CM

## 2015-11-19 DIAGNOSIS — A419 Sepsis, unspecified organism: Secondary | ICD-10-CM

## 2015-11-19 DIAGNOSIS — Z951 Presence of aortocoronary bypass graft: Secondary | ICD-10-CM

## 2015-11-19 DIAGNOSIS — R509 Fever, unspecified: Secondary | ICD-10-CM | POA: Diagnosis not present

## 2015-11-19 DIAGNOSIS — N401 Enlarged prostate with lower urinary tract symptoms: Secondary | ICD-10-CM | POA: Diagnosis present

## 2015-11-19 DIAGNOSIS — I2510 Atherosclerotic heart disease of native coronary artery without angina pectoris: Secondary | ICD-10-CM | POA: Diagnosis not present

## 2015-11-19 DIAGNOSIS — I959 Hypotension, unspecified: Secondary | ICD-10-CM | POA: Diagnosis not present

## 2015-11-19 DIAGNOSIS — E785 Hyperlipidemia, unspecified: Secondary | ICD-10-CM | POA: Diagnosis present

## 2015-11-19 DIAGNOSIS — I10 Essential (primary) hypertension: Secondary | ICD-10-CM | POA: Diagnosis not present

## 2015-11-19 DIAGNOSIS — Z9079 Acquired absence of other genital organ(s): Secondary | ICD-10-CM

## 2015-11-19 DIAGNOSIS — R7881 Bacteremia: Secondary | ICD-10-CM | POA: Diagnosis present

## 2015-11-19 DIAGNOSIS — R7303 Prediabetes: Secondary | ICD-10-CM | POA: Diagnosis not present

## 2015-11-19 DIAGNOSIS — R251 Tremor, unspecified: Secondary | ICD-10-CM | POA: Diagnosis not present

## 2015-11-19 DIAGNOSIS — E119 Type 2 diabetes mellitus without complications: Secondary | ICD-10-CM | POA: Diagnosis not present

## 2015-11-19 DIAGNOSIS — R05 Cough: Secondary | ICD-10-CM | POA: Diagnosis not present

## 2015-11-19 DIAGNOSIS — Z0000 Encounter for general adult medical examination without abnormal findings: Secondary | ICD-10-CM | POA: Diagnosis not present

## 2015-11-19 DIAGNOSIS — I712 Thoracic aortic aneurysm, without rupture: Secondary | ICD-10-CM | POA: Diagnosis present

## 2015-11-19 DIAGNOSIS — B965 Pseudomonas (aeruginosa) (mallei) (pseudomallei) as the cause of diseases classified elsewhere: Secondary | ICD-10-CM | POA: Diagnosis present

## 2015-11-19 DIAGNOSIS — Z8679 Personal history of other diseases of the circulatory system: Secondary | ICD-10-CM

## 2015-11-19 DIAGNOSIS — Z7902 Long term (current) use of antithrombotics/antiplatelets: Secondary | ICD-10-CM

## 2015-11-19 DIAGNOSIS — R0600 Dyspnea, unspecified: Secondary | ICD-10-CM | POA: Diagnosis not present

## 2015-11-19 LAB — COMPREHENSIVE METABOLIC PANEL
ALK PHOS: 35 U/L — AB (ref 38–126)
ALT: 23 U/L (ref 17–63)
ANION GAP: 10 (ref 5–15)
AST: 27 U/L (ref 15–41)
Albumin: 3.4 g/dL — ABNORMAL LOW (ref 3.5–5.0)
BUN: 22 mg/dL — ABNORMAL HIGH (ref 6–20)
CALCIUM: 9.3 mg/dL (ref 8.9–10.3)
CO2: 24 mmol/L (ref 22–32)
CREATININE: 1.13 mg/dL (ref 0.61–1.24)
Chloride: 104 mmol/L (ref 101–111)
GFR, EST NON AFRICAN AMERICAN: 57 mL/min — AB (ref >60)
Glucose, Bld: 149 mg/dL — ABNORMAL HIGH (ref 65–99)
Potassium: 4.3 mmol/L (ref 3.5–5.1)
SODIUM: 138 mmol/L (ref 135–145)
Total Bilirubin: 0.8 mg/dL (ref 0.3–1.2)
Total Protein: 7.6 g/dL (ref 6.5–8.1)

## 2015-11-19 LAB — PROTIME-INR
INR: 1.11 (ref 0.00–1.49)
PROTHROMBIN TIME: 14.5 s (ref 11.6–15.2)

## 2015-11-19 LAB — CBC WITH DIFFERENTIAL/PLATELET
BASOS ABS: 0 10*3/uL (ref 0.0–0.1)
Basophils Relative: 0 %
EOS PCT: 1 %
Eosinophils Absolute: 0.1 10*3/uL (ref 0.0–0.7)
HCT: 40.5 % (ref 39.0–52.0)
HEMOGLOBIN: 13.5 g/dL (ref 13.0–17.0)
Lymphocytes Relative: 13 %
Lymphs Abs: 1.5 10*3/uL (ref 0.7–4.0)
MCH: 31.5 pg (ref 26.0–34.0)
MCHC: 33.3 g/dL (ref 30.0–36.0)
MCV: 94.4 fL (ref 78.0–100.0)
Monocytes Absolute: 0.5 10*3/uL (ref 0.1–1.0)
Monocytes Relative: 5 %
NEUTROS PCT: 81 %
Neutro Abs: 9.2 10*3/uL — ABNORMAL HIGH (ref 1.7–7.7)
Platelets: 121 10*3/uL — ABNORMAL LOW (ref 150–400)
RBC: 4.29 MIL/uL (ref 4.22–5.81)
RDW: 14.6 % (ref 11.5–15.5)
WBC: 11.4 10*3/uL — ABNORMAL HIGH (ref 4.0–10.5)

## 2015-11-19 LAB — I-STAT CG4 LACTIC ACID, ED
LACTIC ACID, VENOUS: 2.67 mmol/L — AB (ref 0.5–2.0)
Lactic Acid, Venous: 2.44 mmol/L (ref 0.5–2.0)

## 2015-11-19 LAB — URINE MICROSCOPIC-ADD ON: SQUAMOUS EPITHELIAL / LPF: NONE SEEN

## 2015-11-19 LAB — URINALYSIS, ROUTINE W REFLEX MICROSCOPIC
Bilirubin Urine: NEGATIVE
Glucose, UA: NEGATIVE mg/dL
Ketones, ur: NEGATIVE mg/dL
Nitrite: NEGATIVE
PROTEIN: NEGATIVE mg/dL
SPECIFIC GRAVITY, URINE: 1.017 (ref 1.005–1.030)
pH: 6 (ref 5.0–8.0)

## 2015-11-19 LAB — I-STAT VENOUS BLOOD GAS, ED
ACID-BASE DEFICIT: 5 mmol/L — AB (ref 0.0–2.0)
BICARBONATE: 20.5 meq/L (ref 20.0–24.0)
O2 SAT: 66 %
TCO2: 22 mmol/L (ref 0–100)
pCO2, Ven: 37.2 mmHg — ABNORMAL LOW (ref 45.0–50.0)
pH, Ven: 7.35 — ABNORMAL HIGH (ref 7.250–7.300)
pO2, Ven: 36 mmHg (ref 31.0–45.0)

## 2015-11-19 MED ORDER — METOPROLOL TARTRATE 25 MG PO TABS: 12.5000 mg | ORAL_TABLET | Freq: Two times a day (BID) | ORAL | Status: DC

## 2015-11-19 MED ORDER — VANCOMYCIN HCL IN DEXTROSE 1-5 GM/200ML-% IV SOLN: 1000.0000 mg | Freq: Once | INTRAVENOUS | Status: DC

## 2015-11-19 MED ORDER — CLOPIDOGREL BISULFATE 75 MG PO TABS
75.0000 mg | ORAL_TABLET | ORAL | Status: DC
  Administered 2015-11-21 – 2015-11-23 (×2): 75 mg via ORAL
  Filled 2015-11-19 (×2): qty 1

## 2015-11-19 MED ORDER — SODIUM CHLORIDE 0.9 % IV BOLUS (SEPSIS)
1000.0000 mL | Freq: Once | INTRAVENOUS | Status: AC
  Administered 2015-11-19: 1000 mL via INTRAVENOUS

## 2015-11-19 MED ORDER — CALCIUM CARBONATE 1250 (500 CA) MG PO TABS
1250.0000 mg | ORAL_TABLET | Freq: Two times a day (BID) | ORAL | Status: DC
  Administered 2015-11-20 – 2015-11-23 (×7): 1250 mg via ORAL
  Filled 2015-11-19 (×7): qty 1

## 2015-11-19 MED ORDER — ENOXAPARIN SODIUM 40 MG/0.4ML ~~LOC~~ SOLN
40.0000 mg | Freq: Every day | SUBCUTANEOUS | Status: DC
  Administered 2015-11-20 – 2015-11-22 (×4): 40 mg via SUBCUTANEOUS
  Filled 2015-11-19 (×3): qty 0.4

## 2015-11-19 MED ORDER — SIMVASTATIN 20 MG PO TABS
20.0000 mg | ORAL_TABLET | Freq: Every day | ORAL | Status: DC
  Administered 2015-11-20 – 2015-11-22 (×3): 20 mg via ORAL
  Filled 2015-11-19 (×4): qty 1

## 2015-11-19 MED ORDER — ACETAMINOPHEN 325 MG PO TABS
650.0000 mg | ORAL_TABLET | Freq: Four times a day (QID) | ORAL | Status: DC | PRN
  Administered 2015-11-21: 650 mg via ORAL
  Filled 2015-11-19: qty 2

## 2015-11-19 MED ORDER — ADULT MULTIVITAMIN W/MINERALS CH
1.0000 | ORAL_TABLET | Freq: Every day | ORAL | Status: DC
  Administered 2015-11-20 – 2015-11-23 (×4): 1 via ORAL
  Filled 2015-11-19 (×4): qty 1

## 2015-11-19 MED ORDER — PIPERACILLIN-TAZOBACTAM 3.375 G IVPB 30 MIN
3.3750 g | Freq: Once | INTRAVENOUS | Status: AC
  Administered 2015-11-19: 3.375 g via INTRAVENOUS
  Filled 2015-11-19: qty 50

## 2015-11-19 MED ORDER — ACETAMINOPHEN 500 MG PO TABS
500.0000 mg | ORAL_TABLET | Freq: Once | ORAL | Status: AC
  Administered 2015-11-19: 500 mg via ORAL
  Filled 2015-11-19: qty 1

## 2015-11-19 MED ORDER — ONDANSETRON HCL 4 MG/2ML IJ SOLN
4.0000 mg | Freq: Once | INTRAMUSCULAR | Status: AC
  Administered 2015-11-19: 4 mg via INTRAVENOUS

## 2015-11-19 MED ORDER — ACETAMINOPHEN 650 MG RE SUPP
650.0000 mg | Freq: Four times a day (QID) | RECTAL | Status: DC | PRN
Start: 2015-11-19 — End: 2015-11-23

## 2015-11-19 MED ORDER — PIPERACILLIN-TAZOBACTAM 3.375 G IVPB
3.3750 g | Freq: Three times a day (TID) | INTRAVENOUS | Status: DC
  Administered 2015-11-20 – 2015-11-21 (×4): 3.375 g via INTRAVENOUS
  Filled 2015-11-19 (×5): qty 50

## 2015-11-19 MED ORDER — SODIUM CHLORIDE 0.9 % IV BOLUS (SEPSIS)
500.0000 mL | Freq: Once | INTRAVENOUS | Status: AC
  Administered 2015-11-19: 500 mL via INTRAVENOUS

## 2015-11-19 MED ORDER — ONDANSETRON HCL 4 MG/2ML IJ SOLN
INTRAMUSCULAR | Status: AC
  Filled 2015-11-19: qty 2

## 2015-11-19 MED ORDER — ONDANSETRON HCL 4 MG/2ML IJ SOLN
4.0000 mg | Freq: Once | INTRAMUSCULAR | Status: DC
Start: 2015-11-19 — End: 2015-11-19

## 2015-11-19 MED ORDER — ONDANSETRON HCL 4 MG/2ML IJ SOLN
4.0000 mg | Freq: Three times a day (TID) | INTRAMUSCULAR | Status: DC | PRN
  Administered 2015-11-21: 4 mg via INTRAVENOUS
  Filled 2015-11-19: qty 2

## 2015-11-19 MED ORDER — TAMSULOSIN HCL 0.4 MG PO CAPS
0.4000 mg | ORAL_CAPSULE | Freq: Every day | ORAL | Status: DC
  Administered 2015-11-20 – 2015-11-22 (×3): 0.4 mg via ORAL
  Filled 2015-11-19 (×3): qty 1

## 2015-11-19 MED ORDER — VANCOMYCIN HCL 10 G IV SOLR
1500.0000 mg | Freq: Once | INTRAVENOUS | Status: AC
  Administered 2015-11-19: 1500 mg via INTRAVENOUS
  Filled 2015-11-19: qty 1500

## 2015-11-19 MED ORDER — VANCOMYCIN HCL IN DEXTROSE 750-5 MG/150ML-% IV SOLN
750.0000 mg | Freq: Two times a day (BID) | INTRAVENOUS | Status: DC
  Administered 2015-11-20 (×2): 750 mg via INTRAVENOUS
  Filled 2015-11-19 (×3): qty 150

## 2015-11-19 MED ORDER — SODIUM CHLORIDE 0.9% FLUSH
3.0000 mL | Freq: Two times a day (BID) | INTRAVENOUS | Status: DC
  Administered 2015-11-20 – 2015-11-22 (×6): 3 mL via INTRAVENOUS

## 2015-11-19 MED ORDER — SODIUM CHLORIDE 0.9 % IV SOLN
1000.0000 mL | INTRAVENOUS | Status: DC
  Administered 2015-11-19 – 2015-11-20 (×3): 1000 mL via INTRAVENOUS

## 2015-11-19 MED ORDER — VITAMIN D 1000 UNITS PO TABS
1000.0000 [IU] | ORAL_TABLET | Freq: Every day | ORAL | Status: DC
  Administered 2015-11-20 – 2015-11-23 (×4): 1000 [IU] via ORAL
  Filled 2015-11-19 (×4): qty 1

## 2015-11-19 MED ORDER — OMEGA-3 FATTY ACIDS 1000 MG PO CAPS: 1.0000 g | ORAL_CAPSULE | Freq: Every day | ORAL | Status: DC

## 2015-11-19 NOTE — ED Notes (Signed)
Dr Niu in room 

## 2015-11-19 NOTE — H&P (Signed)
History and Physical    Charles Terry [id number] DOB: 09-03-1927 DOA: 11/19/2015  Referring MD/NP/PA:   PCP: Horatio Pel, MD   Patient coming from:  The patient is coming from home.  At baseline, pt is independent for most of ADL.    Chief Complaint: Dysuria, fever, chills  HPI: Charles Terry is a 82 y.o. male with medical history significant of hypertension, hyperlipidemia, CAD, CABG, DVT (s/p of IVC filter), BPH, AAA (s/p repair in 2006), newly diagnosed thoracic aorta aneursyms (being watched by vascular surgery w/ plan to re-image in 6 months), who presents with dysuria, fever and chills.  Pt report that he started having fever, chills, dysuria at about 5 PM. He also has intermittent burning on urination and increased urinary frequency. He does not have flank pain or back pain. He reports that 2 weeks ago, he noticed blood in his urine. He was seen by his urologist, Dr. Hartley Barefoot and had CT-abd/pelivs. It showed moderately enlarged prostate, descending thoracic aortic aneurysm of 4.8 cm and cholelithiasis. Pt states that Dr. Hartley Barefoot did "prob" to his prostate on Tuesday and plans to do prostate surgery for him. Patient does not have chest pain, cough, shortness of breath, abdominal pain, nausea, vomiting, diarrhea, unilateral weakness.   ED Course: pt was found to have  hypotension 88/75, which improved to 95/50 with IV fluids; positive urinalysis with moderate amount of leukocytes, No tachycardia, electrolytes and renal function okay, WBC 7.4, lactate is 2.44 -->2.67. Chest x-ray showed mild by basilar opacity. Pt is placed on SDU for obs.  Review of Systems:   General: has fevers, chills, no changes in body weight, has poor appetite, has fatigue HEENT: no blurry vision, hearing changes or sore throat Pulm: no dyspnea, coughing, wheezing CV: no chest pain, no palpitations Abd: no nausea, vomiting, abdominal pain, diarrhea, constipation GU: has dysuria, burning on  urination, increased urinary frequency, hematuria  Ext: no leg edema Neuro: no unilateral weakness, numbness, or tingling, no vision change or hearing loss Skin: no rash MSK: No muscle spasm, no deformity, no limitation of range of movement in spin Heme: No easy bruising.  Travel history: No recent long distant travel.  Allergy:  Allergies  Allergen Reactions  . Food Anaphylaxis    NO KIWI FRUIT   . Aspirin Rash and Other (See Comments)    ANGIOEDEMA    Past Medical History  Diagnosis Date  . History of AAA (abdominal aortic aneurysm) repair 2006    AAA R&G Hemasheild 14 mm x 10 mm (Dr. Donnetta Hutching)  . History of blood clots     right lower extremity dvt  after inguinal hernia surgery may 2012--doppler follow up report 04/07/11 at [facility] heart and vaxcular shows resolution of the dvt--but pt is to have IVC filter placed 05/05/11--prior to planned prostate surgery scheduled for 05/09/11.  . Coronary artery disease 2005    CABG '05. low risk Nuc 5/12  . Hypertension   . Blood transfusion MAY 2012  . Renal cyst may 2012    bleeding from right renal cyst /acute renal failure secondary to urinary retention from bph --pt states  the cyst is no longer a problem--pt still has indwelling foley catheter--plans  open  suprapubic prostatectomy on 11/19 with dr. Gaynelle Arabian  . BPH (benign prostatic hypertrophy) with urinary obstruction 11/12    surg  . History of hematuria   . S/P IVC filter 05/05/11    prior to prostate surgery  . Dyslipidemia   . DVT (deep  venous thrombosis) ([place]) 5/12    after hernia repair    Past Surgical History  Procedure Laterality Date  . Aaa rpair  03/29/2005    14x42mm Hemashield graft  . Hernia repair Left     MAY 2012  . Prostatectomy  05/09/2011    Procedure: PROSTATECTOMY SUPRAPUBIC;  Surgeon: Ailene Rud, MD;  Location: [facility] ORS;  Service: Urology;  Laterality: N/A;  Open  . Venous doppler  04/04/2011    No evidence of thrombus or  thrombophlebitis  . Cardiac catheterization  12/23/2003    Recommended CABG; 100% LAD, 90% proximal circumflex followed by 80% OM1, 100% RCA. Right PDA and left DL in a codominant system.  . Coronary artery bypass graft  12/25/2003    x5, LIMA-LAD, SeqSVG-OM1-OM2, Seq SVG- RPDA-LPL  . Persantine myoview  10/22/2010    Normal perfusion  . 2d echocardiogram  11/02/2010    EF 45-50%, w/ anterior hypokinesis, otherwise normal; mildly sclerotic aortic valve  . Ivc  11/12    placed prior to prostate surg    Social History:  reports that he has quit smoking. He has never used smokeless tobacco. He reports that he drinks about 1.0 - 1.5 oz of alcohol per week. He reports that he does not use illicit drugs.  Family History:  Family History  Problem Relation Age of Onset  . Aneurysm Mother   . Cancer Father     Lung cancer  . Heart attack Brother   . Alzheimer's disease Brother      Prior to Admission medications   Medication Sig Start Date End Date Taking? Authorizing Provider  Calcium Carbonate (CALTRATE 600 PO) Take 600 mg by mouth 2 (two) times daily.    Yes Historical Provider, MD  Cholecalciferol (D3-1000 PO) Take 1,000 Units by mouth daily.    Yes Historical Provider, MD  clopidogrel (PLAVIX) 75 MG tablet Take 1 tablet (75 mg total) by mouth every other day. 07/23/13  Yes Leonie Man, MD  fish oil-omega-3 fatty acids 1000 MG capsule Take 1 g by mouth daily.   Yes Historical Provider, MD  losartan (COZAAR) 100 MG tablet Take 0.5 tablets (50 mg total) by mouth daily. NEEDS APPOINTMENT FOR FUTURE REFILLS Patient taking differently: Take 50 mg by mouth daily. NEEDS APPOINTMENT FOR FUTURE REFILL 12/02/14  Yes Leonie Man, MD  metoprolol tartrate (LOPRESSOR) 25 MG tablet Take 12.5 mg by mouth 2 (two) times daily.    Yes Historical Provider, MD  Multiple Vitamins-Minerals (MULTIVITAMINS THER. W/MINERALS) TABS Take 1 tablet by mouth daily.    Yes Historical Provider, MD  simvastatin (ZOCOR) 40  MG tablet Take 20 mg by mouth at bedtime.    Yes Historical Provider, MD  tamsulosin (FLOMAX) 0.4 MG CAPS capsule Take 0.4 mg by mouth daily after supper.  07/13/13  Yes Historical Provider, MD    Physical Exam: Filed Vitals:   11/20/15 0023 11/20/15 0030 11/20/15 0100 11/20/15 0130  BP: 94/53 89/49 88/47  76/41  Pulse: 67 66 64 65  Temp: 98.2 F (36.8 C)     TempSrc: Oral     Resp: 20 19 20 19   Height: 5\' 9"  (1.753 m)     Weight: 81.6 kg (179 lb 14.3 oz)     SpO2: 98% 98% 99% 99%   General: Not in acute distress HEENT:       Eyes: PERRL, EOMI, no scleral icterus.       ENT: No discharge from the ears and nose, no  pharynx injection, no tonsillar enlargement.        Neck: No JVD, no bruit, no mass felt. Heme: No neck lymph node enlargement. Cardiac: S1/S2, RRR, No murmurs, No gallops or rubs. Pulm: No rales, wheezing, rhonchi or rubs. Abd: Soft, nondistended, nontender, no rebound pain, no organomegaly, BS present. GU: had hematuria. No CVA tenderness. Ext: No pitting leg edema bilaterally. 2+DP/PT pulse bilaterally. Musculoskeletal: No joint deformities, No joint redness or warmth, no limitation of ROM in spin. Skin: No rashes.  Neuro: Alert, oriented X3, cranial nerves II-XII grossly intact, moves all extremities normally. Psych: Patient is not psychotic, no suicidal or hemocidal ideation.  Labs on Admission: I have personally reviewed following labs and imaging studies  CBC:  Recent Labs Lab 11/19/15 1939  WBC 11.4*  NEUTROABS 9.2*  HGB 13.5  HCT 40.5  MCV 94.4  PLT 123XX123*   Basic Metabolic Panel:  Recent Labs Lab 11/19/15 1939  NA 138  K 4.3  CL 104  CO2 24  GLUCOSE 149*  BUN 22*  CREATININE 1.13  CALCIUM 9.3   GFR: Estimated Creatinine Clearance: 47.8 mL/min (by C-G formula based on Cr of 1.13). Liver Function Tests:  Recent Labs Lab 11/19/15 1939  AST 27  ALT 23  ALKPHOS 35*  BILITOT 0.8  PROT 7.6  ALBUMIN 3.4*   No results for input(s):  LIPASE, AMYLASE in the last 168 hours. No results for input(s): AMMONIA in the last 168 hours. Coagulation Profile:  Recent Labs Lab 11/19/15 1939  INR 1.11   Cardiac Enzymes: No results for input(s): CKTOTAL, CKMB, CKMBINDEX, TROPONINI in the last 168 hours. BNP (last 3 results) No results for input(s): PROBNP in the last 8760 hours. HbA1C: No results for input(s): HGBA1C in the last 72 hours. CBG: No results for input(s): GLUCAP in the last 168 hours. Lipid Profile: No results for input(s): CHOL, HDL, LDLCALC, TRIG, CHOLHDL, LDLDIRECT in the last 72 hours. Thyroid Function Tests: No results for input(s): TSH, T4TOTAL, FREET4, T3FREE, THYROIDAB in the last 72 hours. Anemia Panel: No results for input(s): VITAMINB12, FOLATE, FERRITIN, TIBC, IRON, RETICCTPCT in the last 72 hours. Urine analysis:    Component Value Date/Time   COLORURINE YELLOW 11/19/2015 2030   APPEARANCEUR CLOUDY* 11/19/2015 2030   LABSPEC 1.017 11/19/2015 2030   PHURINE 6.0 11/19/2015 2030   [place] NEGATIVE 11/19/2015 2030   HGBUR LARGE* 11/19/2015 2030   [place] NEGATIVE 11/19/2015 2030   [place] 11/19/2015 2030   PROTEINUR NEGATIVE 11/19/2015 2030   UROBILINOGEN 0.2 05/10/2011 0725   NITRITE NEGATIVE 11/19/2015 2030   LEUKOCYTESUR MODERATE* 11/19/2015 2030   Sepsis Labs: @LABRCNTIP (procalcitonin:4,lacticidven:4) )No results found for this or any previous visit (from the past 240 hour(s)).   Radiological Exams on Admission: Dg Chest 2 View  11/19/2015  CLINICAL DATA:  Acute onset of cough and fever. Shakiness and vomiting. Initial encounter. EXAM: CHEST  2 VIEW COMPARISON:  Chest radiograph from 01/02/2015 FINDINGS: The lungs are well-aerated. Vascular congestion is noted. Mild bibasilar opacities likely reflect atelectasis, though mild pneumonia might have a similar appearance. There is no evidence of pleural effusion or pneumothorax. The heart is borderline normal in size. The patient  is status post median sternotomy, with evidence of prior CABG. No acute osseous abnormalities are seen. IMPRESSION: Vascular congestion noted. Mild bibasilar opacities likely reflect atelectasis, though mild pneumonia might have a similar appearance. Electronically Signed   By: Garald Balding M.D.   On: 11/19/2015 21:15     EKG: Independently reviewed.  Sinus rhythm, QTC 425, RAD, early R-wave progression  Assessment/Plan Principal Problem:   UTI (lower urinary tract infection) Active Problems:   History of AAA (abdominal aortic aneurysm) repair-'06   S/P CABG x 5   Essential hypertension   BPH (benign prostatic hypertrophy) with urinary obstruction-surg 11/12   S/P IVC filter - placed electively prior to prostate surgery 11/12   Dyslipidemia   Sepsis ([place])  UTI and sepsis: His UTI is likely triggered by recent "prob' procedure. He is septic with hypotension, fever, leukocytosis and elevated lactate.   - place on SDU for obs - IV vancomycin and zosyn - Follow up results of urine and blood cx and amend antibiotic regimen if needed per sensitivity results - prn Zofran for nausea - will get Procalcitonin and trend lactic acid levels per sepsis protocol. - IVF: 2.5L of NS bolus in ED, followed by 125 cc/h - will give NS bolus as needed for hypotension. If still not stabilized, will call PCCM to start vasopressor  HTN: -Hold cozar and metoprolol due to hypotension  CAD: s/p of CABG. No chest pain. -hold metoprolol as above -Continue Plavix and Zocor  HLD: Last LDL was not on record -Continue home medications: Zocor -Check FLP  BPH (benign prostatic hypertrophy) with urinary obstruction-surg 11/12: Dr. Hartley Barefoot is planing to do surgery per pt.  -follow up with Dr. Hartley Barefoot. -continue Flomax, but will start on 6/2 due to hypotension   DVT ppx: sQ Lovenox Code Status: Full code Family Communication: Yes, patient's wife at bed side Disposition Plan:  Anticipate discharge back  to previous home environment Consults called:  none Admission status:  SDU/obs       Date of Service 11/20/2015    Ivor Costa [facility] Pager [phone]  If 7PM-7AM, please contact night-coverage [email or web address] Password [email or web address] 11/20/2015, 2:28 AM

## 2015-11-19 NOTE — ED Provider Notes (Signed)
CSN: [id number]     Arrival date & time 11/19/15  1927 History   First MD Initiated Contact with Patient 11/19/15 1939     Chief Complaint  Patient presents with  . Fever  . Mass    epigastric   . Chills   HPI Patient is an 82M with PMH AAA (s/p repair in 2006), newly diagnosed thoracic aorta aneursyms (being watched by vascular surgery w/ plan to re-image in 6 months), CAD s/p CABG, BPH, DVT s/p IVC filter, BPH s/p prostatectomy., known cholelithiasis (seen on recent abdominal CT) presents with fever and chills. Patient reports he had a normal day today until dinner time. At the dinner table he began to have shaking chills. He then began to feel nauseated. EMS was called and patient was noted to be febrile with oral temp 100.8, HR 78, BP 160/81, O2 sat 94% on room air pta. He had an episode of nbnb emesis in the ambulance and received 4mg  Zofran IV.  Denies recent cough, congestion, abdominal pain, diarrhea, dysuria, or changes in bowel movements. Last BM was today and normal. Patient has been having hematuria recently and was evaluated by his urologist with a CT scan in April. Incidental findings on scan include gall stones and 4.6cm thoracic aortic aneurysm.   Past Medical History  Diagnosis Date  . History of AAA (abdominal aortic aneurysm) repair 2006    AAA R&G Hemasheild 14 mm x 10 mm (Dr. Donnetta Hutching)  . History of blood clots     right lower extremity dvt  after inguinal hernia surgery may 2012--doppler follow up report 04/07/11 at [facility] vaxcular shows resolution of the dvt--but pt is to have IVC filter placed 05/05/11--prior to planned prostate surgery scheduled for 05/09/11.  . Coronary artery disease 2005    CABG '05. low risk Nuc 5/12  . Hypertension   . Blood transfusion MAY 2012  . Renal cyst may 2012    bleeding from right renal cyst /acute renal failure secondary to urinary retention from bph --pt states  the cyst is no longer a problem--pt still has indwelling foley  catheter--plans  open  suprapubic prostatectomy on 11/19 with dr. Gaynelle Arabian  . BPH (benign prostatic hypertrophy) with urinary obstruction 11/12    surg  . History of hematuria   . S/P IVC filter 05/05/11    prior to prostate surgery  . Dyslipidemia   . DVT (deep venous thrombosis) ([place]) 5/12    after hernia repair   Past Surgical History  Procedure Laterality Date  . Aaa rpair  03/29/2005    14x77mm Hemashield graft  . Hernia repair Left     MAY 2012  . Prostatectomy  05/09/2011    Procedure: PROSTATECTOMY SUPRAPUBIC;  Surgeon: Ailene Rud, MD;  Location: [facility]S;  Service: Urology;  Laterality: N/A;  Open  . Venous doppler  04/04/2011    No evidence of thrombus or thrombophlebitis  . Cardiac catheterization  12/23/2003    Recommended CABG; 100% LAD, 90% proximal circumflex followed by 80% OM1, 100% RCA. Right PDA and left DL in a codominant system.  . Coronary artery bypass graft  12/25/2003    x5, LIMA-LAD, SeqSVG-OM1-OM2, Seq SVG- RPDA-LPL  . Persantine myoview  10/22/2010    Normal perfusion  . 2d echocardiogram  11/02/2010    EF 45-50%, w/ anterior hypokinesis, otherwise normal; mildly sclerotic aortic valve  . Ivc  11/12    placed prior to prostate surg   Family History  Problem Relation  Age of Onset  . Aneurysm Mother   . Cancer Father     Lung cancer  . Heart attack Brother   . Alzheimer's disease Brother    Social History  Substance Use Topics  . Smoking status: Former Smoker -- 1.00 packs/day for 20 years  . Smokeless tobacco: Never Used  . Alcohol Use: 1.0 - 1.5 oz/week    2-3 drink(s) per week     Comment: OCCAS    Review of Systems  Constitutional: Positive for fever, chills and fatigue.  HENT: Negative for congestion and rhinorrhea.   Eyes: Negative for visual disturbance.  Respiratory: Negative for cough and shortness of breath.   Cardiovascular: Negative for chest pain, palpitations and leg swelling.  Gastrointestinal: Positive for nausea and  vomiting. Negative for diarrhea and constipation.  Genitourinary: Positive for hematuria and difficulty urinating.  Musculoskeletal: Negative for back pain and neck pain.  Skin: Negative for pallor and rash.  Neurological: Negative for dizziness and headaches.  Psychiatric/Behavioral: Negative for confusion.      Allergies  Aspirin and Food  Home Medications   Prior to Admission medications   Medication Sig Start Date End Date Taking? Authorizing Provider  Calcium Carbonate (CALTRATE 600 PO) Take 600 mg by mouth 2 (two) times daily.     Historical Provider, MD  Cholecalciferol (D3-1000 PO) Take 1,000 Units by mouth daily.     Historical Provider, MD  clopidogrel (PLAVIX) 75 MG tablet Take 1 tablet (75 mg total) by mouth every other day. 07/23/13   Leonie Man, MD  fish oil-omega-3 fatty acids 1000 MG capsule Take 1 g by mouth daily.    Historical Provider, MD  losartan (COZAAR) 100 MG tablet Take 0.5 tablets (50 mg total) by mouth daily. NEEDS APPOINTMENT FOR FUTURE REFILLS 12/02/14   Leonie Man, MD  metoprolol tartrate (LOPRESSOR) 25 MG tablet Take 12.5 mg by mouth 2 (two) times daily.     Historical Provider, MD  Multiple Vitamins-Minerals (MULTIVITAMINS THER. W/MINERALS) TABS Take 1 tablet by mouth daily.     Historical Provider, MD  simvastatin (ZOCOR) 40 MG tablet Take 20 mg by mouth at bedtime.     Historical Provider, MD  tamsulosin (FLOMAX) 0.4 MG CAPS capsule Take 0.4 mg by mouth daily after supper.  07/13/13   Historical Provider, MD   BP 137/62 mmHg  Pulse 79  Temp(Src) 102.7 F (39.3 C) (Rectal)  Resp 19  Ht 5\' 10"  (1.778 m)  Wt 78.926 kg  BMI 24.97 kg/m2  SpO2 93% Physical Exam  Constitutional: He is oriented to person, place, and time. He appears well-developed and well-nourished. He appears distressed (secondary to nausea, actively vomiting on exam).  HENT:  Head: Normocephalic and atraumatic.  Eyes: EOM are normal. Pupils are equal, round, and reactive to  light.  Neck: Normal range of motion. Neck supple. No JVD present.  Cardiovascular: Normal rate, regular rhythm and intact distal pulses.   Radial pulses equal and 2+  Pulmonary/Chest: Effort normal and breath sounds normal. No respiratory distress. He has no wheezes. He has no rales.  Abdominal: Soft. There is no tenderness.  Hyperactive bowel sounds. Well healed midline scar from prior AAA repair w/ likely incisional hernia  Musculoskeletal: Normal range of motion. He exhibits no edema or tenderness.  Neurological: He is alert and oriented to person, place, and time.  Skin: Skin is warm and dry. No rash noted.  Psychiatric: He has a normal mood and affect.  Nursing note and vitals  reviewed.   ED Course  Procedures (including critical care time) Labs Review Labs Reviewed  COMPREHENSIVE METABOLIC PANEL - Abnormal; Notable for the following:    Glucose, Bld 149 (*)    BUN 22 (*)    Albumin 3.4 (*)    Alkaline Phosphatase 35 (*)    GFR calc non Af Amer 57 (*)    All other components within normal limits  CBC WITH DIFFERENTIAL/PLATELET - Abnormal; Notable for the following:    WBC 11.4 (*)    Platelets 121 (*)    Neutro Abs 9.2 (*)    All other components within normal limits  URINALYSIS, ROUTINE W REFLEX MICROSCOPIC (NOT AT [facility]) - Abnormal; Notable for the following:    APPearance CLOUDY (*)    Hgb urine dipstick LARGE (*)    Leukocytes, UA MODERATE (*)    All other components within normal limits  URINE MICROSCOPIC-ADD ON - Abnormal; Notable for the following:    Bacteria, UA MANY (*)    All other components within normal limits  I-STAT CG4 LACTIC ACID, ED - Abnormal; Notable for the following:    Lactic Acid, Venous 2.44 (*)    All other components within normal limits  I-STAT CG4 LACTIC ACID, ED - Abnormal; Notable for the following:    Lactic Acid, Venous 2.67 (*)    All other components within normal limits  I-STAT VENOUS BLOOD GAS, ED - Abnormal; Notable for the  following:    pH, Ven 7.350 (*)    pCO2, Ven 37.2 (*)    Acid-base deficit 5.0 (*)    All other components within normal limits  CULTURE, BLOOD (ROUTINE X 2)  CULTURE, BLOOD (ROUTINE X 2)  URINE CULTURE  PROTIME-INR  LIPID PANEL  LACTIC ACID, PLASMA  LACTIC ACID, PLASMA  PROCALCITONIN  APTT  BASIC METABOLIC PANEL  CBC    Imaging Review Dg Chest 2 View  11/19/2015  CLINICAL DATA:  Acute onset of cough and fever. Shakiness and vomiting. Initial encounter. EXAM: CHEST  2 VIEW COMPARISON:  Chest radiograph from 01/02/2015 FINDINGS: The lungs are well-aerated. Vascular congestion is noted. Mild bibasilar opacities likely reflect atelectasis, though mild pneumonia might have a similar appearance. There is no evidence of pleural effusion or pneumothorax. The heart is borderline normal in size. The patient is status post median sternotomy, with evidence of prior CABG. No acute osseous abnormalities are seen. IMPRESSION: Vascular congestion noted. Mild bibasilar opacities likely reflect atelectasis, though mild pneumonia might have a similar appearance. Electronically Signed   By: Garald Balding M.D.   On: 11/19/2015 21:15   I have personally reviewed and evaluated these images and lab results as part of my medical decision-making.   EKG Interpretation   Date/Time:  Thursday November 19 2015 19:41:35 EDT Ventricular Rate:  78 PR Interval:  59 QRS Duration: 104 QT Interval:  373 QTC Calculation: 425 R Axis:   -19 Text Interpretation:  Sinus rhythm Short PR interval Incomplete RBBB and  LAFB Low voltage, precordial leads Confirmed by [facility] MD, Corene Cornea [phone])  on 11/19/2015 8:30:04 PM      MDM   Final diagnoses:  Sepsis secondary to UTI ([place])  Fever and chills    Patient is a 82 year old male with past medical history as above presents with sudden onset of rigors this evening. Febrile at 102.7 by rectal temp of presentation. O2 sats borderline in low 90s. He is in mild distress and  actively vomiting on exam. IV Zofran given. Abdomen is  non-peritoneal, w/ hyperactive bowel sounds, and incisional hernia. No other focal findings on exam. Concern for pyelonephritis vs PNA as a source of fever. Doubt SBO as patient reports normal BMs. Although patient has hx gallstones, there is no RUQ TTP to indicate cholecystitis. EKG without acute ischemic changes. Chest x-rays x-ray shows mild vascular contraction and a basilar atelectasis. Patient has not had cough or sputum production doubt pneumonia. Sepsis workup was initiated with labs, urine cultures blood cultures, CXR. Patient has a lactic acidosis of 2.4. He has a mild leukocytosis. UA is consistent with infection. Patient was covered with Vanc/Zosyn for sepsis presumed due to pyelonephritis. While in the ED patient's blood pressure trended down to [id number] systolic. He received 1500 mL of normal saline with mild improvement. Repeat lactate 2.6. Hospitalist consulted for admission and triage of patient. Plan to admit to stepdown unit for further monitoring, gentle hydration, and treatment of sepsis due to UTI.   Patient was seen and discussed with Dr. Dayna Barker, ED attending    Gibson Ramp, MD 11/20/15 0023  Merrily Pew, MD 11/20/15 [id number]

## 2015-11-19 NOTE — Progress Notes (Signed)
Pharmacy Antibiotic Note  Charles Terry is a 82 y.o. male admitted on 11/19/2015 with fever, chills, nausea. SCr 1.1, eCrCl ~ 50 ml/min, LA 2.4, CBC wnl. Initiating empiric abx for sepsis.  Plan: -Vancomycin 1500 mg IV x1 then 750/12h -Zosyn 3.375 g IV q8h -Monitor renal fx, cultures, VT as needed  Height: 5\' 10"  (177.8 cm) Weight: 174 lb (78.926 kg) IBW/kg (Calculated) : 73  Temp (24hrs), Avg:102.7 F (39.3 C), Min:102.7 F (39.3 C), Max:102.7 F (39.3 C)  No results for input(s): WBC, CREATININE, LATICACIDVEN, VANCOTROUGH, VANCOPEAK, VANCORANDOM, GENTTROUGH, GENTPEAK, GENTRANDOM, TOBRATROUGH, TOBRAPEAK, TOBRARND, AMIKACINPEAK, AMIKACINTROU, AMIKACIN in the last 168 hours.  CrCl cannot be calculated (Patient has no serum creatinine result on file.).    Allergies  Allergen Reactions  . Aspirin Other (See Comments)    Unknown   . Food Swelling    KIWI FRUIT. THROAT SWELLING    Antimicrobials this admission: 6/1 vancomycin > 6/1 zosyn >  Dose adjustments this admission: NA  Microbiology results: 6/1 blood cx: 6/1 urine cx:  Thank you for allowing pharmacy to be a part of this patient's care.  Harvel Quale 11/19/2015 8:01 PM

## 2015-11-19 NOTE — ED Notes (Signed)
Pt brought to ED by [facility] from home for fever of 100.8 orally, shakiness and small new epigastric mass, pt recently diagnose with Thoracic Aortic Aneurysm, Hx of hernia but new mass presenting on the epigastric side. Pt vomit PTA to ED, VS 100.8 temp, HR 78, BP 160/81 SPO2 94% RA. 4 mg of IV zofran IV given PTA by EMS.

## 2015-11-19 NOTE — ED Provider Notes (Signed)
I saw and evaluated the patient, reviewed the resident's note and I agree with the findings and plan.  Here with acute onset of fever, chills, rigors and vomiting earlier today. No pain anywhere. No other complaitns. On exam, tachypneic, hot to touch, hyperactive bowel sounds, incisional hernia. Slightly sleepy, borderline low oxygen saturations.  Concern for PNA v pyelo w/ sbo less likely.  Plan for fluids, abx, labs, imaging as appropriate.    EKG Interpretation   Date/Time:  Thursday November 19 2015 19:41:35 EDT Ventricular Rate:  78 PR Interval:  59 QRS Duration: 104 QT Interval:  373 QTC Calculation: 425 R Axis:   -19 Text Interpretation:  Sinus rhythm Short PR interval Incomplete RBBB and  LAFB Low voltage, precordial leads Confirmed by [facility] MD, Shakena Callari [phone])  on 11/19/2015 8:30:04 PM        Merrily Pew, MD 11/20/15 0028

## 2015-11-20 DIAGNOSIS — R0600 Dyspnea, unspecified: Secondary | ICD-10-CM | POA: Diagnosis not present

## 2015-11-20 DIAGNOSIS — Z7902 Long term (current) use of antithrombotics/antiplatelets: Secondary | ICD-10-CM | POA: Diagnosis not present

## 2015-11-20 DIAGNOSIS — I959 Hypotension, unspecified: Secondary | ICD-10-CM | POA: Diagnosis present

## 2015-11-20 DIAGNOSIS — N390 Urinary tract infection, site not specified: Secondary | ICD-10-CM | POA: Diagnosis not present

## 2015-11-20 DIAGNOSIS — Z8679 Personal history of other diseases of the circulatory system: Secondary | ICD-10-CM | POA: Diagnosis not present

## 2015-11-20 DIAGNOSIS — I10 Essential (primary) hypertension: Secondary | ICD-10-CM | POA: Diagnosis not present

## 2015-11-20 DIAGNOSIS — Z9079 Acquired absence of other genital organ(s): Secondary | ICD-10-CM | POA: Diagnosis not present

## 2015-11-20 DIAGNOSIS — Z87891 Personal history of nicotine dependence: Secondary | ICD-10-CM | POA: Diagnosis not present

## 2015-11-20 DIAGNOSIS — A4152 Sepsis due to Pseudomonas: Secondary | ICD-10-CM | POA: Diagnosis present

## 2015-11-20 DIAGNOSIS — Z951 Presence of aortocoronary bypass graft: Secondary | ICD-10-CM | POA: Diagnosis not present

## 2015-11-20 DIAGNOSIS — R509 Fever, unspecified: Secondary | ICD-10-CM | POA: Diagnosis not present

## 2015-11-20 DIAGNOSIS — I712 Thoracic aortic aneurysm, without rupture: Secondary | ICD-10-CM | POA: Diagnosis present

## 2015-11-20 DIAGNOSIS — N401 Enlarged prostate with lower urinary tract symptoms: Secondary | ICD-10-CM | POA: Diagnosis not present

## 2015-11-20 DIAGNOSIS — I2510 Atherosclerotic heart disease of native coronary artery without angina pectoris: Secondary | ICD-10-CM | POA: Diagnosis present

## 2015-11-20 DIAGNOSIS — E785 Hyperlipidemia, unspecified: Secondary | ICD-10-CM | POA: Diagnosis not present

## 2015-11-20 DIAGNOSIS — D696 Thrombocytopenia, unspecified: Secondary | ICD-10-CM | POA: Diagnosis present

## 2015-11-20 DIAGNOSIS — R062 Wheezing: Secondary | ICD-10-CM | POA: Diagnosis not present

## 2015-11-20 LAB — BLOOD CULTURE ID PANEL (REFLEXED)
Acinetobacter baumannii: NOT DETECTED
CANDIDA PARAPSILOSIS: NOT DETECTED
CANDIDA TROPICALIS: NOT DETECTED
CARBAPENEM RESISTANCE: NOT DETECTED
Candida albicans: NOT DETECTED
Candida glabrata: NOT DETECTED
Candida krusei: NOT DETECTED
ENTEROBACTERIACEAE SPECIES: NOT DETECTED
ENTEROCOCCUS SPECIES: NOT DETECTED
Enterobacter cloacae complex: NOT DETECTED
Escherichia coli: NOT DETECTED
HAEMOPHILUS INFLUENZAE: NOT DETECTED
KLEBSIELLA PNEUMONIAE: NOT DETECTED
Klebsiella oxytoca: NOT DETECTED
Listeria monocytogenes: NOT DETECTED
METHICILLIN RESISTANCE: NOT DETECTED
Neisseria meningitidis: NOT DETECTED
PROTEUS SPECIES: NOT DETECTED
PSEUDOMONAS AERUGINOSA: DETECTED — AB
STAPHYLOCOCCUS AUREUS BCID: NOT DETECTED
STAPHYLOCOCCUS SPECIES: NOT DETECTED
STREPTOCOCCUS PYOGENES: NOT DETECTED
Serratia marcescens: NOT DETECTED
Streptococcus agalactiae: NOT DETECTED
Streptococcus pneumoniae: NOT DETECTED
Streptococcus species: NOT DETECTED
VANCOMYCIN RESISTANCE: NOT DETECTED

## 2015-11-20 LAB — CBC
HEMATOCRIT: 35.3 % — AB (ref 39.0–52.0)
HEMOGLOBIN: 11.1 g/dL — AB (ref 13.0–17.0)
MCH: 30.3 pg (ref 26.0–34.0)
MCHC: 31.4 g/dL (ref 30.0–36.0)
MCV: 96.4 fL (ref 78.0–100.0)
Platelets: 93 10*3/uL — ABNORMAL LOW (ref 150–400)
RBC: 3.66 MIL/uL — AB (ref 4.22–5.81)
RDW: 14.8 % (ref 11.5–15.5)
WBC: 12.8 10*3/uL — AB (ref 4.0–10.5)

## 2015-11-20 LAB — LIPID PANEL
CHOL/HDL RATIO: 2.7 ratio
CHOLESTEROL: 83 mg/dL (ref 0–200)
HDL: 31 mg/dL — AB (ref >40)
LDL Cholesterol: 38 mg/dL (ref 0–99)
TRIGLYCERIDES: 72 mg/dL (ref <150)
VLDL: 14 mg/dL (ref 0–40)

## 2015-11-20 LAB — BASIC METABOLIC PANEL
Anion gap: 4 — ABNORMAL LOW (ref 5–15)
BUN: 18 mg/dL (ref 6–20)
CHLORIDE: 112 mmol/L — AB (ref 101–111)
CO2: 22 mmol/L (ref 22–32)
Calcium: 7.3 mg/dL — ABNORMAL LOW (ref 8.9–10.3)
Creatinine, Ser: 1.05 mg/dL (ref 0.61–1.24)
GFR calc Af Amer: >60 mL/min (ref >60)
GFR calc non Af Amer: >60 mL/min (ref >60)
Glucose, Bld: 134 mg/dL — ABNORMAL HIGH (ref 65–99)
POTASSIUM: 3.9 mmol/L (ref 3.5–5.1)
SODIUM: 138 mmol/L (ref 135–145)

## 2015-11-20 LAB — LACTIC ACID, PLASMA
LACTIC ACID, VENOUS: 1.2 mmol/L (ref 0.5–2.0)
LACTIC ACID, VENOUS: 2.1 mmol/L — AB (ref 0.5–2.0)
Lactic Acid, Venous: 2.1 mmol/L (ref 0.5–2.0)
Lactic Acid, Venous: 1.6 mmol/L (ref 0.5–2.0)

## 2015-11-20 LAB — MRSA PCR SCREENING: MRSA by PCR: NEGATIVE

## 2015-11-20 LAB — PROCALCITONIN: Procalcitonin: 4.45 ng/mL

## 2015-11-20 LAB — APTT: aPTT: 29 seconds (ref 24–37)

## 2015-11-20 MED ORDER — SODIUM CHLORIDE 0.9 % IV BOLUS (SEPSIS)
1500.0000 mL | Freq: Once | INTRAVENOUS | Status: AC
  Administered 2015-11-20: 1500 mL via INTRAVENOUS

## 2015-11-20 MED ORDER — SODIUM CHLORIDE 0.9 % IV BOLUS (SEPSIS)
250.0000 mL | Freq: Once | INTRAVENOUS | Status: AC
  Administered 2015-11-20: 250 mL via INTRAVENOUS

## 2015-11-20 MED ORDER — SODIUM CHLORIDE 0.9 % IV SOLN
1000.0000 mL | INTRAVENOUS | Status: DC
  Administered 2015-11-20: 1000 mL via INTRAVENOUS

## 2015-11-20 MED ORDER — OMEGA-3-ACID ETHYL ESTERS 1 G PO CAPS
1.0000 g | ORAL_CAPSULE | Freq: Every day | ORAL | Status: DC
  Administered 2015-11-20 – 2015-11-23 (×4): 1 g via ORAL
  Filled 2015-11-20 (×4): qty 1

## 2015-11-20 NOTE — Progress Notes (Signed)
PHARMACY - PHYSICIAN COMMUNICATION CRITICAL VALUE ALERT - BLOOD CULTURE IDENTIFICATION (BCID)  Results for orders placed or performed during the hospital encounter of 11/19/15  Blood Culture ID Panel (Reflexed) (Collected: 11/19/2015  8:12 PM)  Result Value Ref Range   Enterococcus species NOT DETECTED NOT DETECTED   Vancomycin resistance NOT DETECTED NOT DETECTED   Listeria monocytogenes NOT DETECTED NOT DETECTED   Staphylococcus species NOT DETECTED NOT DETECTED   Staphylococcus aureus NOT DETECTED NOT DETECTED   Methicillin resistance NOT DETECTED NOT DETECTED   Streptococcus species NOT DETECTED NOT DETECTED   Streptococcus agalactiae NOT DETECTED NOT DETECTED   Streptococcus pneumoniae NOT DETECTED NOT DETECTED   Streptococcus pyogenes NOT DETECTED NOT DETECTED   Acinetobacter baumannii NOT DETECTED NOT DETECTED   Enterobacteriaceae species NOT DETECTED NOT DETECTED   Enterobacter cloacae complex NOT DETECTED NOT DETECTED   Escherichia coli NOT DETECTED NOT DETECTED   Klebsiella oxytoca NOT DETECTED NOT DETECTED   Klebsiella pneumoniae NOT DETECTED NOT DETECTED   Proteus species NOT DETECTED NOT DETECTED   Serratia marcescens NOT DETECTED NOT DETECTED   Carbapenem resistance NOT DETECTED NOT DETECTED   Haemophilus influenzae NOT DETECTED NOT DETECTED   Neisseria meningitidis NOT DETECTED NOT DETECTED   Pseudomonas aeruginosa DETECTED (A) NOT DETECTED   Candida albicans NOT DETECTED NOT DETECTED   Candida glabrata NOT DETECTED NOT DETECTED   Candida krusei NOT DETECTED NOT DETECTED   Candida parapsilosis NOT DETECTED NOT DETECTED   Candida tropicalis NOT DETECTED NOT DETECTED    Name of physician (or Provider) Contacted:  no one listed for floor coverage yet this evening- spoke with Flow Manager who asked me to call NP T. Rogue Bussing as they were working on coverage. I paged as asked and call was not returned  Changes to prescribed antibiotics required: Zosyn covers pseudomonas,  so no change is immediately required, however- Recommend changing to cefepime or ceftazidime in the morning and stopping Vancomycin if there are no other infectious concerns (ie HCAP).    Callie Facey 11/20/2015  7:33 PM

## 2015-11-20 NOTE — Progress Notes (Signed)
PT Cancellation Note  Patient Details Name: Charles Terry MRN: [id number] DOB: 1928/04/20   Cancelled Treatment:    Reason Eval/Treat Not Completed: PT screened, no needs identified, will sign off.  Per OT pt is independent.  PT will sign off.  Collie Siad PT, DPT  Pager: [phone] Phone: [phone] 11/20/2015, 11:34 AM

## 2015-11-20 NOTE — Progress Notes (Signed)
[facility]                                                                              Patient Demographics  Charles Terry, is a 82 y.o. male, DOB - 1928/05/19, TMB:[id number]  Admit date - 11/19/2015   Admitting Physician Ivor Costa, MD  Outpatient Primary MD for the patient is Horatio Pel, MD  Outpatient specialists:   LOS -   days    Chief Complaint  Patient presents with  . Fever  . Mass    epigastric   . Chills       Brief summary   Patient is a 82 year old male with hypertension, hyperlipidemia, CAD, CABG, DVT (s/p of IVC filter), BPH, AAA (s/p repair in 2006), newly diagnosed thoracic aorta aneursyms (being watched by vascular surgery w/ plan to re-image in 6 months), who presents with dysuria, fever and chills. The patient reported that he was having fever, chills, dysuria. He had hematuria 2 weeks ago and was seen by Dr. Era Bumpers, urology. Per patient, Dr. Minus Liberty had done a procedure on Tuesday with plans to do elective prostate procedure in future (per wife, not scheduled yet). UA positive for UTI. Patient was admitted for sepsis secondary to UTI.   Assessment & Plan    UTI and sepsis:  - Patient met SIRS criteria with hypotension, fever, leukocytosis and elevated lactate. - Continue IV fluid hydration, IV vancomycin and Zosyn. Narrow antibiotics according to culture results - BP is improving, low threshold for starting vasopressors if not improving - Follow blood cultures, urine culture and sensitivities   HTN: -Hold cozar and metoprolol due to hypotension  CAD: s/p of CABG. No chest pain. -hold metoprolol as above -Continue Plavix and Zocor  HLD: Last LDL was not on record -Continue home medications: Zocor -Check FLP  BPH (benign prostatic hypertrophy) with urinary obstruction-surg 11/12: Dr. Hartley Barefoot is planing to do surgery per pt.  -follow up with Dr. Hartley Barefoot. - Continue Flomax  Code Status: full CODE  STATUS  DVT Prophylaxis:  Lovenox   Family Communication: Discussed in detail with the patient, all imaging results, lab results explained to the pt and wife in the room  Disposition Plan: Continue to monitor and stepdown unit  Time Spent in minutes   25 minutes  Procedures:    Consultants:   None  Antimicrobials:   IV vancomycin 6/1  IV Zosyn 6/1   Medications  Scheduled Meds: . calcium carbonate  1,250 mg Oral BID WC  . cholecalciferol  1,000 Units Oral Daily  . [START ON 11/21/2015] clopidogrel  75 mg Oral QODAY  . enoxaparin (LOVENOX) injection  40 mg Subcutaneous QHS  . multivitamin with minerals  1 tablet Oral Daily  . omega-3 acid ethyl esters  1 g Oral Daily  . piperacillin-tazobactam (ZOSYN)  IV  3.375 g Intravenous Q8H  . simvastatin  20 mg Oral QHS  . sodium chloride flush  3 mL Intravenous Q12H  . tamsulosin  0.4 mg Oral QPC supper  . vancomycin  750 mg Intravenous Q12H   Continuous Infusions: . sodium chloride 1,000 mL (11/20/15 0100)   PRN Meds:.acetaminophen **OR** acetaminophen,  ondansetron (ZOFRAN) IV   Antibiotics   Anti-infectives    Start     Dose/Rate Route Frequency Ordered Stop   11/20/15 0800  vancomycin (VANCOCIN) IVPB 750 mg/150 ml premix     750 mg 150 mL/hr over 60 Minutes Intravenous Every 12 hours 11/19/15 2119     11/20/15 0200  piperacillin-tazobactam (ZOSYN) IVPB 3.375 g     3.375 g 12.5 mL/hr over 240 Minutes Intravenous Every 8 hours 11/19/15 2119     11/19/15 2000  piperacillin-tazobactam (ZOSYN) IVPB 3.375 g     3.375 g 100 mL/hr over 30 Minutes Intravenous  Once 11/19/15 1957 11/19/15 2124   11/19/15 2000  vancomycin (VANCOCIN) IVPB 1000 mg/200 mL premix  Status:  Discontinued     1,000 mg 200 mL/hr over 60 Minutes Intravenous  Once 11/19/15 1957 11/19/15 1959   11/19/15 2000  vancomycin (VANCOCIN) 1,500 mg in sodium chloride 0.9 % 500 mL IVPB     1,500 mg 250 mL/hr over 120 Minutes Intravenous  Once 11/19/15 1959  11/19/15 2229        Subjective:   Charles Terry was seen and examined today. Feeling better today, afebrile, BP improving. Patient denies dizziness, chest pain, shortness of breath, abdominal pain, N/V/D/C, new weakness, numbess, tingling.    Objective:   Filed Vitals:   11/20/15 0600 11/20/15 0630 11/20/15 0700 11/20/15 0903  BP: 78/45 97/76 104/64 99/45  Pulse: 61 63 61 66  Temp:    98.5 F (36.9 C)  TempSrc:    Oral  Resp: 26 25 21 25   Height:      Weight:      SpO2: 98% 98% 96% 95%    Intake/Output Summary (Last 24 hours) at 11/20/15 1150 Last data filed at 11/20/15 1000  Gross per 24 hour  Intake   4753 ml  Output    800 ml  Net   3953 ml     Wt Readings from Last 3 Encounters:  11/20/15 81.6 kg (179 lb 14.3 oz)  10/27/15 79.334 kg (174 lb 14.4 oz)  01/12/15 79.861 kg (176 lb 1 oz)     Exam  General: Alert and oriented x 3, NAD  HEENT:  PERRLA, EOMI, Anicteric Sclera, mucous membranes moist.   Neck: Supple, no JVD, no masses  Cardiovascular: S1 S2 auscultated, no rubs, murmurs or gallops. Regular rate and rhythm.  Respiratory: Clear to auscultation bilaterally, no wheezing, rales or rhonchi  Gastrointestinal: Soft, nontender, nondistended, + bowel sounds  Ext: no cyanosis clubbing or edema  Neuro: AAOx3, Cr N's II- XII. Strength 5/5 upper and lower extremities bilaterally  Skin: No rashes  Psych: Normal affect and demeanor, alert and oriented x3    Data Reviewed:  I have personally reviewed following labs and imaging studies  Micro Results Recent Results (from the past 240 hour(s))  MRSA PCR Screening     Status: None   Collection Time: 11/20/15 12:39 AM  Result Value Ref Range Status   MRSA by PCR NEGATIVE NEGATIVE Final    Comment:        The GeneXpert MRSA Assay (FDA approved for NASAL specimens only), is one component of a comprehensive MRSA colonization surveillance program. It is not intended to diagnose MRSA infection nor  to guide or monitor treatment for MRSA infections.     Radiology Reports Dg Chest 2 View  11/19/2015  CLINICAL DATA:  Acute onset of cough and fever. Shakiness and vomiting. Initial encounter. EXAM: CHEST  2 VIEW COMPARISON:  Chest radiograph from 01/02/2015 FINDINGS: The lungs are well-aerated. Vascular congestion is noted. Mild bibasilar opacities likely reflect atelectasis, though mild pneumonia might have a similar appearance. There is no evidence of pleural effusion or pneumothorax. The heart is borderline normal in size. The patient is status post median sternotomy, with evidence of prior CABG. No acute osseous abnormalities are seen. IMPRESSION: Vascular congestion noted. Mild bibasilar opacities likely reflect atelectasis, though mild pneumonia might have a similar appearance. Electronically Signed   By: Garald Balding M.D.   On: 11/19/2015 21:15    Lab Data:  CBC:  Recent Labs Lab 11/19/15 1939 11/20/15 0440  WBC 11.4* 12.8*  NEUTROABS 9.2*  --   HGB 13.5 11.1*  HCT 40.5 35.3*  MCV 94.4 96.4  PLT 121* 93*   Basic Metabolic Panel:  Recent Labs Lab 11/19/15 1939 11/20/15 0440  NA 138 138  K 4.3 3.9  CL 104 112*  CO2 24 22  GLUCOSE 149* 134*  BUN 22* 18  CREATININE 1.13 1.05  CALCIUM 9.3 7.3*   GFR: Estimated Creatinine Clearance: 51.4 mL/min (by C-G formula based on Cr of 1.05). Liver Function Tests:  Recent Labs Lab 11/19/15 1939  AST 27  ALT 23  ALKPHOS 35*  BILITOT 0.8  PROT 7.6  ALBUMIN 3.4*   No results for input(s): LIPASE, AMYLASE in the last 168 hours. No results for input(s): AMMONIA in the last 168 hours. Coagulation Profile:  Recent Labs Lab 11/19/15 1939  INR 1.11   Cardiac Enzymes: No results for input(s): CKTOTAL, CKMB, CKMBINDEX, TROPONINI in the last 168 hours. BNP (last 3 results) No results for input(s): PROBNP in the last 8760 hours. HbA1C: No results for input(s): HGBA1C in the last 72 hours. CBG: No results for  input(s): GLUCAP in the last 168 hours. Lipid Profile:  Recent Labs  11/20/15 0440  CHOL 83  HDL 31*  LDLCALC 38  TRIG 72  CHOLHDL 2.7   Thyroid Function Tests: No results for input(s): TSH, T4TOTAL, FREET4, T3FREE, THYROIDAB in the last 72 hours. Anemia Panel: No results for input(s): VITAMINB12, FOLATE, FERRITIN, TIBC, IRON, RETICCTPCT in the last 72 hours. Urine analysis:    Component Value Date/Time   COLORURINE YELLOW 11/19/2015 2030   APPEARANCEUR CLOUDY* 11/19/2015 2030   LABSPEC 1.017 11/19/2015 2030   PHURINE 6.0 11/19/2015 2030   [place] NEGATIVE 11/19/2015 2030   HGBUR LARGE* 11/19/2015 2030   [place] 11/19/2015 2030   [place] 11/19/2015 2030   PROTEINUR NEGATIVE 11/19/2015 2030   UROBILINOGEN 0.2 05/10/2011 0725   NITRITE NEGATIVE 11/19/2015 2030   LEUKOCYTESUR MODERATE* 11/19/2015 2030     Charles Terry M.D. [facility] 11/20/2015, 11:50 AM  Pager: [phone] Between 7am to 7pm - call Pager - [phone]  After 7pm go to [email or web address] - password [email or web address]  Call night coverage person covering after 7pm

## 2015-11-20 NOTE — Evaluation (Signed)
Occupational Therapy Evaluation Patient Details Name: Charles Terry MRN: [id number] DOB: November 18, 1927 Today's Date: 11/20/2015    History of Present Illness This 82 y.o. male admitted dysuria, fever, and chills.  Dx UTI and sepsis.  PMH includes:  HTN, hyperlipidemia, CAD, CABG, DVT (s/p of IVC filter), BPH, AAA (repair in 2006), newly diagnoses thoracic aortic aneurysm.     Clinical Impression   Patient evaluated by Occupational Therapy with no further acute OT needs identified. All education has been completed and the patient has no further questions. Pt is able to perform ADLs independently, but needs assist managing lines.  DOE 2-3/4, VSS.  See below for any follow-up Occupational Therapy or equipment needs. OT is signing off. Thank you for this referral.      Follow Up Recommendations  No OT follow up;Supervision - Intermittent    Equipment Recommendations  None recommended by OT    Recommendations for Other Services       Precautions / Restrictions Precautions Precautions: None Restrictions Weight Bearing Restrictions: No      Mobility Bed Mobility Overal bed mobility: Modified Independent                Transfers Overall transfer level: Independent                    Balance Overall balance assessment: No apparent balance deficits (not formally assessed)                                          ADL Overall ADL's : Modified independent                                       General ADL Comments: requires assist managing lines      Vision     Perception     Praxis      Pertinent Vitals/Pain Pain Assessment: No/denies pain     Hand Dominance Right   Extremity/Trunk Assessment Upper Extremity Assessment Upper Extremity Assessment: Overall WFL for tasks assessed   Lower Extremity Assessment Lower Extremity Assessment: Overall WFL for tasks assessed   Cervical / Trunk Assessment Cervical / Trunk  Assessment: Normal   Communication Communication Communication: No difficulties   Cognition Arousal/Alertness: Awake/alert Behavior During Therapy: WFL for tasks assessed/performed Overall Cognitive Status: Within Functional Limits for tasks assessed                     General Comments       Exercises       Shoulder Instructions      Home Living Family/patient expects to be discharged to:: Private residence   Available Help at Discharge: Family;Available 24 hours/day Type of Home: Apartment Home Access: Stairs to enter Entrance Stairs-Number of Steps: 1 at the front   Home Layout: Two level     Bathroom Shower/Tub: Tub/shower unit;Curtain Shower/tub characteristics: Curtain       Home Equipment: None          Prior Functioning/Environment Level of Independence: Independent        Comments: Pt reports he just returned from the beach.  He denies any falls     OT Diagnosis:     OT Problem List:     OT Treatment/Interventions:      OT Goals(Current  goals can be found in the care plan section) Acute Rehab OT Goals Patient Stated Goal: to go home  OT Goal Formulation: All assessment and education complete, DC therapy  OT Frequency:     Barriers to D/C:            Co-evaluation              End of Session Nurse Communication: Mobility status  Activity Tolerance: Patient tolerated treatment well Patient left: in chair;with call bell/phone within reach   Time: [id number] OT Time Calculation (min): 33 min Charges:  OT General Charges $OT Visit: 1 Procedure OT Evaluation $OT Eval Low Complexity: 1 Procedure OT Treatments $Self Care/Home Management : 8-22 mins G-Codes: OT G-codes **NOT FOR INPATIENT CLASS** Functional Limitation: Self care Self Care Current Status [id number]): At least 1 percent but less than 20 percent impaired, limited or restricted Self Care Goal Status [id number]): At least 1 percent but less than 20 percent impaired,  limited or restricted Self Care Discharge Status [phone]): At least 1 percent but less than 20 percent impaired, limited or restricted  Madalee Altmann M 11/20/2015, 11:37 AM

## 2015-11-20 NOTE — Progress Notes (Signed)
Patient asked to be scooted up in bed and after moving pt became SOB, Tachypneic,  and audibly wheezing. Patient placed on 2 L/[place], sat up in bed, and instructed on use of IS. Also explained to patient importance of using IS and sitting up in bed as much as possible. Also discussed importance of activity. Patient and family members voiced understanding. Paged Dr. Tana Coast to inform her of incident. Will pass information on to night shift for further monitoring.  Milford Cage, RN

## 2015-11-20 NOTE — Progress Notes (Signed)
CRITICAL VALUE ALERT  Critical value received:  Lactic acid 2.1  Date of notification:  11/20/15  Time of notification:  0135  Critical value read back: Yes  Nurse who received alert:  Remo Lipps, RN  MD notified (1st page):  [place]  Time of first page:  0136  Orders in place

## 2015-11-21 ENCOUNTER — Inpatient Hospital Stay (HOSPITAL_COMMUNITY): Payer: Medicare Other

## 2015-11-21 DIAGNOSIS — R7881 Bacteremia: Secondary | ICD-10-CM | POA: Diagnosis present

## 2015-11-21 DIAGNOSIS — B965 Pseudomonas (aeruginosa) (mallei) (pseudomallei) as the cause of diseases classified elsewhere: Secondary | ICD-10-CM | POA: Diagnosis present

## 2015-11-21 DIAGNOSIS — N390 Urinary tract infection, site not specified: Secondary | ICD-10-CM

## 2015-11-21 LAB — CBC
HCT: 37 % — ABNORMAL LOW (ref 39.0–52.0)
Hemoglobin: 11.9 g/dL — ABNORMAL LOW (ref 13.0–17.0)
MCH: 30.6 pg (ref 26.0–34.0)
MCHC: 32.2 g/dL (ref 30.0–36.0)
MCV: 95.1 fL (ref 78.0–100.0)
Platelets: 101 10*3/uL — ABNORMAL LOW (ref 150–400)
RBC: 3.89 MIL/uL — ABNORMAL LOW (ref 4.22–5.81)
RDW: 14.9 % (ref 11.5–15.5)
WBC: 13.8 10*3/uL — ABNORMAL HIGH (ref 4.0–10.5)

## 2015-11-21 LAB — BASIC METABOLIC PANEL
Anion gap: 7 (ref 5–15)
BUN: 12 mg/dL (ref 6–20)
CALCIUM: 8.1 mg/dL — AB (ref 8.9–10.3)
CO2: 23 mmol/L (ref 22–32)
CREATININE: 0.97 mg/dL (ref 0.61–1.24)
Chloride: 108 mmol/L (ref 101–111)
GFR calc non Af Amer: >60 mL/min (ref >60)
Glucose, Bld: 127 mg/dL — ABNORMAL HIGH (ref 65–99)
Potassium: 4.1 mmol/L (ref 3.5–5.1)
SODIUM: 138 mmol/L (ref 135–145)

## 2015-11-21 LAB — URINE CULTURE: CULTURE: NO GROWTH

## 2015-11-21 MED ORDER — FUROSEMIDE 10 MG/ML IJ SOLN
20.0000 mg | Freq: Once | INTRAMUSCULAR | Status: AC
  Administered 2015-11-21: 20 mg via INTRAVENOUS
  Filled 2015-11-21: qty 2

## 2015-11-21 MED ORDER — ALBUTEROL SULFATE (2.5 MG/3ML) 0.083% IN NEBU
2.5000 mg | INHALATION_SOLUTION | Freq: Four times a day (QID) | RESPIRATORY_TRACT | Status: DC | PRN
  Filled 2015-11-21: qty 3

## 2015-11-21 MED ORDER — DEXTROSE 5 % IV SOLN
2.0000 g | Freq: Three times a day (TID) | INTRAVENOUS | Status: DC
  Administered 2015-11-21 – 2015-11-23 (×7): 2 g via INTRAVENOUS
  Filled 2015-11-21 (×9): qty 2

## 2015-11-21 NOTE — Progress Notes (Signed)
Pts blood pressure drops to [id number] systolic when comfortable and sleeping.  However, when you wake him up it comes back up to 130's.  Also, he has audible expiratory wheezing but it is only in the upper airway, not in the lung fields.  He sounds like he is having difficulty breathing but pt states he does not feel SOB, RR is 19, oxygen is 94% on RA, and lungs are clear/diminished.

## 2015-11-21 NOTE — Progress Notes (Signed)
Called for Prn treatment given patient breathing difficulty and questionable wheezes. Upon evaluating patient, BBS heard with no wheezes any lobe. Some Crackles heard in bases with Diminished breath sounds there. No Treatment of Albuterol needed at this time. Discussed with patient how to get good use of his IS and to deep breath and Cough if he needs to with a pillow as a splint for pain around his surgery area. Patient agrees with no Albuterol treatment at this time and on use of IS as tolerated. Will continue to monitor for any changes.

## 2015-11-21 NOTE — Progress Notes (Addendum)
PROGRESS NOTE  Charles Terry JME:[id number] DOB: 1927/12/17 DOA: 11/19/2015 PCP: Horatio Pel, MD   LOS: 1 day   Brief Narrative: Patient is a 82 year old male with hypertension, hyperlipidemia, CAD, CABG, DVT (s/p of IVC filter), BPH, AAA (s/p repair in 2006), newly diagnosed thoracic aorta aneursyms (being watched by vascular surgery w/ plan to re-image in 6 months), who presents with dysuria, fever and chills. The patient reported that he was having fever, chills, dysuria. He had hematuria 2 weeks ago and was seen by Dr. Era Bumpers, urology. Per patient, Dr. Minus Liberty had done a procedure on Tuesday with plans to do elective prostate procedure in future (per wife, not scheduled yet). UA positive for UTI. Patient was admitted for sepsis secondary to UTI. Blood cultures positive for pseudomonas.  Assessment & Plan: Principal Problem:   UTI (lower urinary tract infection) Active Problems:   History of AAA (abdominal aortic aneurysm) repair-'06   S/P CABG x 5   Essential hypertension   BPH (benign prostatic hypertrophy) with urinary obstruction-surg 11/12   S/P IVC filter - placed electively prior to prostate surgery 11/12   Dyslipidemia   Sepsis ([place])   Bacteremia due to Pseudomonas  Addendum: called by RN bedside due to abdominal discomfort, wheezing and mild shortness of breath. Patient re-evaluated bedside, with audible bibasilar crackles, minimal wheezing and visible abdominal distention. VSS. Will obtain CXR, Abd XR, bladder scan to eval for retention. Lasix x 1 given audible crackles. Given cardiac history, prior EF 45-50% and how sensitive he was to fluids, will repeat 2D echo.  Sepsis due to Pseudomonas bacteremia / urinary tract infection - Patient met SIRS criteria with hypotension, fever, leukocytosis and elevated lactate. - Sepsis physiology resolved, stop IV fluids, good by mouth intake - Blood cultures on out of 2 bottles positive for Pseudomonas, start cefepime  instead of Zosyn, discontinue vancomycin - ID reflex panel also positive for Pseudomonas - We'll obtain surveillance blood cultures today  HTN - continue to hold cozar and metoprolol due to hypotension  CAD - s/p of CABG. No chest pain. - hold metoprolol as above - Continue Plavix and Zocor  HLD - Last LDL was not on record - Continue home medications: Zocor  BPH (benign prostatic hypertrophy) with urinary obstruction - Patient is status post cystoscopy on 5/30 - Dr. Hartley Barefoot is planing to do surgery per pt.  - follow up with Dr. Hartley Barefoot as an outpatient - Continue Flomax  Thrombocytopenia - Likely in the setting of sepsis, continue to monitor - improving  DVT prophylaxis: Lovenox Code Status: Full Family Communication: discussed with wife bedside Disposition Plan: transfer to regular floor  Consultants:   None   Procedures:   None   Antimicrobials:  Vancomycin 6/1 >> 6/3  Zosyn 6/1 >> 6/3  Cefepime 6/3 >>    Subjective: - Multiple complaints this morning regarding his Foley catheter as well as IV in his arm  - no chest pain, shortness of breath, no abdominal pain, nausea or vomiting.    Objective: Filed Vitals:   11/21/15 0200 11/21/15 0400 11/21/15 0600 11/21/15 0800  BP: 128/62 134/59 130/90 149/90  Pulse: 72 60 58 83  Temp:  98.7 F (37.1 C)    TempSrc:  Oral    Resp: _0 Height:      Weight:      SpO2: 93% 91% 89% 92%    Intake/Output Summary (Last 24 hours) at 11/21/15 1100 Last data filed at 11/21/15 0924  Gross per  24 hour  Intake 2324.25 ml  Output      0 ml  Net 2324.25 ml   Filed Weights   11/19/15 1935 11/20/15 0023  Weight: 78.926 kg (174 lb) 81.6 kg (179 lb 14.3 oz)    Examination: Constitutional: NAD Filed Vitals:   11/21/15 0200 11/21/15 0400 11/21/15 0600 11/21/15 0800  BP: 128/62 134/59 130/90 149/90  Pulse: 72 60 58 83  Temp:  98.7 F (37.1 C)    TempSrc:  Oral    Resp: _0 Height:        Weight:      SpO2: 93% 91% 89% 92%   Eyes: PERRL ENMT: Mucous membranes are moist.  Respiratory: clear to auscultation bilaterally, no wheezing, no crackles. Normal respiratory effort. No accessory muscle use.  Cardiovascular: Regular rate and rhythm, no murmurs / rubs / gallops. No LE edema. 2+ pedal pulses.  Abdomen: no tenderness. Bowel sounds positive.  Musculoskeletal: no clubbing / cyanosis.   Neurologic: non focal  Psychiatric: Normal judgment and insight. Alert and oriented x 3. Normal mood.    Data Reviewed: I have personally reviewed following labs and imaging studies  CBC:  Recent Labs Lab 11/19/15 1939 11/20/15 0440 11/21/15 0217  WBC 11.4* 12.8* 13.8*  NEUTROABS 9.2*  --   --   HGB 13.5 11.1* 11.9*  HCT 40.5 35.3* 37.0*  MCV 94.4 96.4 95.1  PLT 121* 93* 572*   Basic Metabolic Panel:  Recent Labs Lab 11/19/15 1939 11/20/15 0440 11/21/15 0217  NA 138 138 138  K 4.3 3.9 4.1  CL 104 112* 108  CO2 _1 GLUCOSE 149* 134* 127*  BUN 22* 18 12  CREATININE 1.13 1.05 0.97  CALCIUM 9.3 7.3* 8.1*   GFR: Estimated Creatinine Clearance: 55.7 mL/min (by C-G formula based on Cr of 0.97). Liver Function Tests:  Recent Labs Lab 11/19/15 1939  AST 27  ALT 23  ALKPHOS 35*  BILITOT 0.8  PROT 7.6  ALBUMIN 3.4*   No results for input(s): LIPASE, AMYLASE in the last 168 hours. No results for input(s): AMMONIA in the last 168 hours. Coagulation Profile:  Recent Labs Lab 11/19/15 1939  INR 1.11   Cardiac Enzymes: No results for input(s): CKTOTAL, CKMB, CKMBINDEX, TROPONINI in the last 168 hours. BNP (last 3 results) No results for input(s): PROBNP in the last 8760 hours. HbA1C: No results for input(s): HGBA1C in the last 72 hours. CBG: No results for input(s): GLUCAP in the last 168 hours. Lipid Profile:  Recent Labs  11/20/15 0440  CHOL 83  HDL 31*  LDLCALC 38  TRIG 72  CHOLHDL 2.7   Thyroid Function Tests: No results for input(s):  TSH, T4TOTAL, FREET4, T3FREE, THYROIDAB in the last 72 hours. Anemia Panel: No results for input(s): VITAMINB12, FOLATE, FERRITIN, TIBC, IRON, RETICCTPCT in the last 72 hours. Urine analysis:    Component Value Date/Time   COLORURINE YELLOW 11/19/2015 2030   APPEARANCEUR CLOUDY* 11/19/2015 2030   LABSPEC 1.017 11/19/2015 2030   PHURINE 6.0 11/19/2015 2030   [place] NEGATIVE 11/19/2015 2030   HGBUR LARGE* 11/19/2015 2030   [place] NEGATIVE 11/19/2015 2030   [place] 11/19/2015 2030   PROTEINUR NEGATIVE 11/19/2015 2030   UROBILINOGEN 0.2 05/10/2011 0725   NITRITE NEGATIVE 11/19/2015 2030   LEUKOCYTESUR MODERATE* 11/19/2015 2030   Sepsis Labs: Invalid input(s): PROCALCITONIN, LACTICIDVEN  Recent Results (from the past 240 hour(s))  Blood Culture (routine x 2)     Status: None (  Preliminary result)   Collection Time: 11/19/15  8:12 PM  Result Value Ref Range Status   Specimen Description BLOOD LEFT ANTECUBITAL  Final   Special Requests BOTTLES DRAWN AEROBIC AND ANAEROBIC 10CC  Final   Culture NO GROWTH 2 DAYS  Final   Report Status PENDING  Incomplete  Blood Culture (routine x 2)     Status: Abnormal (Preliminary result)   Collection Time: 11/19/15  8:12 PM  Result Value Ref Range Status   Specimen Description BLOOD RIGHT FOREARM  Final   Special Requests BOTTLES DRAWN AEROBIC AND ANAEROBIC 5CC  Final   Culture  Setup Time   Final    GRAM NEGATIVE RODS AEROBIC BOTTLE ONLY CRITICAL RESULT CALLED TO, READ BACK BY AND VERIFIED WITH: L BAJBUS PHARMD 1927 11/20/15 A BROWNING    Culture (A)  Final    PSEUDOMONAS AERUGINOSA SUSCEPTIBILITIES TO FOLLOW    Report Status PENDING  Incomplete  Blood Culture ID Panel (Reflexed)     Status: Abnormal   Collection Time: 11/19/15  8:12 PM  Result Value Ref Range Status   Enterococcus species NOT DETECTED NOT DETECTED Final   Vancomycin resistance NOT DETECTED NOT DETECTED Final   Listeria monocytogenes NOT DETECTED NOT  DETECTED Final   Staphylococcus species NOT DETECTED NOT DETECTED Final   Staphylococcus aureus NOT DETECTED NOT DETECTED Final   Methicillin resistance NOT DETECTED NOT DETECTED Final   Streptococcus species NOT DETECTED NOT DETECTED Final   Streptococcus agalactiae NOT DETECTED NOT DETECTED Final   Streptococcus pneumoniae NOT DETECTED NOT DETECTED Final   Streptococcus pyogenes NOT DETECTED NOT DETECTED Final   Acinetobacter baumannii NOT DETECTED NOT DETECTED Final   Enterobacteriaceae species NOT DETECTED NOT DETECTED Final   Enterobacter cloacae complex NOT DETECTED NOT DETECTED Final   Escherichia coli NOT DETECTED NOT DETECTED Final   Klebsiella oxytoca NOT DETECTED NOT DETECTED Final   Klebsiella pneumoniae NOT DETECTED NOT DETECTED Final   Proteus species NOT DETECTED NOT DETECTED Final   Serratia marcescens NOT DETECTED NOT DETECTED Final   Carbapenem resistance NOT DETECTED NOT DETECTED Final   Haemophilus influenzae NOT DETECTED NOT DETECTED Final   Neisseria meningitidis NOT DETECTED NOT DETECTED Final   Pseudomonas aeruginosa DETECTED (A) NOT DETECTED Final    Comment: CRITICAL RESULT CALLED TO, READ BACK BY AND VERIFIED WITH: L BAJBUS PHARMD 1927 11/20/15 A BROWNING    Candida albicans NOT DETECTED NOT DETECTED Final   Candida glabrata NOT DETECTED NOT DETECTED Final   Candida krusei NOT DETECTED NOT DETECTED Final   Candida parapsilosis NOT DETECTED NOT DETECTED Final   Candida tropicalis NOT DETECTED NOT DETECTED Final  Urine culture     Status: None   Collection Time: 11/19/15  8:30 PM  Result Value Ref Range Status   Specimen Description URINE, CATHETERIZED  Final   Special Requests NONE  Final   Culture NO GROWTH  Final   Report Status 11/21/2015 FINAL  Final  MRSA PCR Screening     Status: None   Collection Time: 11/20/15 12:39 AM  Result Value Ref Range Status   MRSA by PCR NEGATIVE NEGATIVE Final    Comment:        The GeneXpert MRSA Assay (FDA approved  for NASAL specimens only), is one component of a comprehensive MRSA colonization surveillance program. It is not intended to diagnose MRSA infection nor to guide or monitor treatment for MRSA infections.       Radiology Studies: Dg Chest 2 View  11/19/2015  CLINICAL DATA:  Acute onset of cough and fever. Shakiness and vomiting. Initial encounter. EXAM: CHEST  2 VIEW COMPARISON:  Chest radiograph from 01/02/2015 FINDINGS: The lungs are well-aerated. Vascular congestion is noted. Mild bibasilar opacities likely reflect atelectasis, though mild pneumonia might have a similar appearance. There is no evidence of pleural effusion or pneumothorax. The heart is borderline normal in size. The patient is status post median sternotomy, with evidence of prior CABG. No acute osseous abnormalities are seen. IMPRESSION: Vascular congestion noted. Mild bibasilar opacities likely reflect atelectasis, though mild pneumonia might have a similar appearance. Electronically Signed   By: Garald Balding M.D.   On: 11/19/2015 21:15     Scheduled Meds: . calcium carbonate  1,250 mg Oral BID WC  . ceFEPime (MAXIPIME) IV  2 g Intravenous Q8H  . cholecalciferol  1,000 Units Oral Daily  . clopidogrel  75 mg Oral QODAY  . enoxaparin (LOVENOX) injection  40 mg Subcutaneous QHS  . multivitamin with minerals  1 tablet Oral Daily  . omega-3 acid ethyl esters  1 g Oral Daily  . simvastatin  20 mg Oral QHS  . sodium chloride flush  3 mL Intravenous Q12H  . tamsulosin  0.4 mg Oral QPC supper   Continuous Infusions:    Marzetta Board, MD, PhD [facility] Pager [phone]  If 7PM-7AM, please contact night-coverage [email or web address] Password [email or web address] 11/21/2015, 11:00 AM

## 2015-11-21 NOTE — Progress Notes (Signed)
Pharmacy Antibiotic Note  Charles Terry is a 82 y.o. male admitted on 11/19/2015 with bacteremia.  Pharmacy has been consulted for cefepime dosing (narrowed from vanc/Zosyn from BCID report).  Plan: Cefepime 2g IV Q8H.  Height: 5\' 9"  (175.3 cm) Weight: 179 lb 14.3 oz (81.6 kg) IBW/kg (Calculated) : 70.7  Temp (24hrs), Avg:99 F (37.2 C), Min:98.3 F (36.8 C), Max:100 F (37.8 C)   Recent Labs Lab 11/19/15 1939  11/19/15 2237 11/20/15 0046 11/20/15 0440 11/20/15 1448 11/20/15 1804 11/21/15 0217  WBC 11.4*  --   --   --  12.8*  --   --  13.8*  CREATININE 1.13  --   --   --  1.05  --   --  0.97  LATICACIDVEN  --   < > 2.67* 2.1* 2.1* 1.6 1.2  --   < > = values in this interval not displayed.  Estimated Creatinine Clearance: 55.7 mL/min (by C-G formula based on Cr of 0.97).    Allergies  Allergen Reactions  . Food Anaphylaxis    NO KIWI FRUIT   . Aspirin Rash and Other (See Comments)    ANGIOEDEMA    Antimicrobials this admission: 6/1 Vanc >> 6/3 6/1 Zosyn >> 6/3  Microbiology results: MRSA PCR negative  Blood cx 6/1 > Pseudomonas aeruginosa Urine cx 6/1 > pending   Thank you for allowing pharmacy to be a part of this patient's care.  Wynona Neat, PharmD, BCPS  11/21/2015 7:32 AM

## 2015-11-21 NOTE — Plan of Care (Signed)
Problem: Education: Goal: Knowledge of [facility] Education information/materials will improve Outcome: Completed/Met Date Met:  11/21/15 Patient given information guide and shown how to use devices in room. Explained current health condition at length with patient, including changes daily.   Problem: Safety: Goal: Ability to remain free from injury will improve Outcome: Completed/Met Date Met:  11/21/15 Patient instructed on how to call for help and use call light to ensure safety maintained. Room is free of debris and clutter minimized as much as possible.  Problem: Health Behavior/Discharge Planning: Goal: Ability to manage health-related needs will improve Outcome: Progressing Assessed patient for discharge planning by asking him in detail about what he has at home and ensuring PT approves of his ability to go home without medical assistance. PT walked with patient in hallway and he showed no signs of difficulty caring for self.  Problem: Pain Managment: Goal: General experience of comfort will improve Outcome: Completed/Met Date Met:  11/21/15 Patient is assessed for pain throughout shift and has consistently denied any pain. Pt voiced understanding about our pain rating and instructed to call if he needs anything for pain.  Problem: Physical Regulation: Goal: Ability to maintain clinical measurements within normal limits will improve Outcome: Progressing Patient is independent at baseline and is progressing to this level throughout his stay. Currently taking off telemetry, IV fluids, ensuring patient uses IS and is active to avoid any complications from immobility.  Problem: Skin Integrity: Goal: Risk for impaired skin integrity will decrease Outcome: Completed/Met Date Met:  11/21/15 Skin without breakdown; assessing daily.  Problem: Tissue Perfusion: Goal: Risk factors for ineffective tissue perfusion will decrease Outcome: Completed/Met Date Met:  11/21/15 Patient  has history of DVT and is on Lovenox for a VTE to prevent recurrence. Also patient is up in chair, walking in halls, to remain active.  Problem: Activity: Goal: Risk for activity intolerance will decrease Outcome: Progressing Patient up in chair and walks halls several times daily. Able to use BSC and using IS. While ambulating patient takes breaks as necessary- but tolerating well.  Problem: Fluid Volume: Goal: Ability to maintain a balanced intake and output will improve Outcome: Progressing Patient taken off IV fluids today and instructed on importance of drinking plenty of fluids as he will no longer be receiving them via IV.  Problem: Nutrition: Goal: Adequate nutrition will be maintained Outcome: Completed/Met Date Met:  11/21/15 Patient eating 50-75% of meal trays brought to room. This is baseline amount for patient.   Problem: Bowel/Gastric: Goal: Will not experience complications related to bowel motility Outcome: Progressing Patient having several BM's per day due to medication regimen. Continuing to monitor and expect to return to baseline once antibiotics are completed.

## 2015-11-21 NOTE — Significant Event (Addendum)
Rapid Response Event Note  Overview: Time Called: 2115 Arrival Time: 2115 Event Type: Other (Comment), Respiratory  Initial Focused Assessment: Rounded on patient (RT advised on patient status earlier), patient was in bathroom vomiting. Per bedside nurse, abdomen has been distended and pt has had urinary retention.  Patient was assisted back to bed, lung sounds were clear in the upper lobes and diminished in the lowers, abdomen was tight, distended, with hypoactive bowel sounds. VS: 165/83, 95% on RA, HR 74, bedside RN giving zofran, will straight cath patient and or will place foley for urinary retention.  RN advised to call Rapid Response for other concerns   Outcome: Other (Comment)  Event End Time: 2124  Aivan Fillingim R

## 2015-11-21 NOTE — Plan of Care (Signed)
Problem: Fluid Volume: Goal: Hemodynamic stability will improve Outcome: Completed/Met Date Met:  11/21/15 Patient received 5 L in ED prior to floor admission.   Problem: Physical Regulation: Goal: Diagnostic test results will improve Outcome: Progressing Patient lactic acid was regulated from 2.67 to 1.2. WBC count is continuing to be monitored as antibiotics are being administered. LOC returned to normal.   Problem: Respiratory: Goal: Ability to maintain adequate ventilation will improve Outcome: Completed/Met Date Met:  11/21/15 Patient without breathing problems.

## 2015-11-21 NOTE — Progress Notes (Signed)
Report given to Hassan Rowan, RN to take care of patient on 6N. Patient transferred via wheelchair to 6N06. Wife at bedside. CCMD notified as patient will no longer be on telemetry up there. Patient belongings sent with him, including cell phone and clothing.   Milford Cage, RN

## 2015-11-22 LAB — BASIC METABOLIC PANEL
Anion gap: 6 (ref 5–15)
BUN: 11 mg/dL (ref 6–20)
CHLORIDE: 109 mmol/L (ref 101–111)
CO2: 24 mmol/L (ref 22–32)
CREATININE: 0.92 mg/dL (ref 0.61–1.24)
Calcium: 8.2 mg/dL — ABNORMAL LOW (ref 8.9–10.3)
GFR calc Af Amer: >60 mL/min (ref >60)
GFR calc non Af Amer: >60 mL/min (ref >60)
GLUCOSE: 124 mg/dL — AB (ref 65–99)
POTASSIUM: 3.4 mmol/L — AB (ref 3.5–5.1)
Sodium: 139 mmol/L (ref 135–145)

## 2015-11-22 LAB — CBC
HCT: 33.4 % — ABNORMAL LOW (ref 39.0–52.0)
HEMOGLOBIN: 11.1 g/dL — AB (ref 13.0–17.0)
MCH: 30.4 pg (ref 26.0–34.0)
MCHC: 33.2 g/dL (ref 30.0–36.0)
MCV: 91.5 fL (ref 78.0–100.0)
Platelets: 112 10*3/uL — ABNORMAL LOW (ref 150–400)
RBC: 3.65 MIL/uL — AB (ref 4.22–5.81)
RDW: 14.5 % (ref 11.5–15.5)
WBC: 10.7 10*3/uL — ABNORMAL HIGH (ref 4.0–10.5)

## 2015-11-22 LAB — CULTURE, BLOOD (ROUTINE X 2)

## 2015-11-22 MED ORDER — POTASSIUM CHLORIDE CRYS ER 20 MEQ PO TBCR
30.0000 meq | EXTENDED_RELEASE_TABLET | ORAL | Status: AC
  Administered 2015-11-22 (×2): 30 meq via ORAL
  Filled 2015-11-22 (×2): qty 1

## 2015-11-22 MED ORDER — FUROSEMIDE 10 MG/ML IJ SOLN
20.0000 mg | Freq: Once | INTRAMUSCULAR | Status: AC
  Administered 2015-11-22: 20 mg via INTRAVENOUS
  Filled 2015-11-22: qty 2

## 2015-11-22 NOTE — Progress Notes (Signed)
PROGRESS NOTE  Charles Terry NMM:[id number] DOB: 1928/01/20 DOA: 11/19/2015 PCP: Horatio Pel, MD   LOS: 2 days   Brief Narrative: Patient is a 82 year old male with hypertension, hyperlipidemia, CAD, CABG, DVT (s/p of IVC filter), BPH, AAA (s/p repair in 2006), newly diagnosed thoracic aorta aneursyms (being watched by vascular surgery w/ plan to re-image in 6 months), who presents with dysuria, fever and chills. The patient reported that he was having fever, chills, dysuria. He had hematuria 2 weeks ago and was seen by Dr. Era Bumpers, urology. Per patient, Dr. Minus Liberty had done a procedure on Tuesday with plans to do elective prostate procedure in future (per wife, not scheduled yet). UA positive for UTI. Patient was admitted for sepsis secondary to UTI. Blood cultures positive for pseudomonas.  Assessment & Plan: Principal Problem:   UTI (lower urinary tract infection) Active Problems:   History of AAA (abdominal aortic aneurysm) repair-'06   S/P CABG x 5   Essential hypertension   BPH (benign prostatic hypertrophy) with urinary obstruction-surg 11/12   S/P IVC filter - placed electively prior to prostate surgery 11/12   Dyslipidemia   Sepsis ([place])   Bacteremia due to Pseudomonas  Sepsis due to Pseudomonas bacteremia / urinary tract infection - Patient met SIRS criteria with hypotension, fever, leukocytosis and elevated lactate. - Sepsis physiology resolved, stop IV fluids, good by mouth intake - Blood cultures on out of 2 bottles positive for Pseudomonas, start cefepime instead of Zosyn, discontinue vancomycin - ID reflex panel also positive for Pseudomonas - Surveillance blood cultures obtained on 6/3 are negative to date - Sensitivities for the Pseudomonas are still pending - His sepsis physiology has resolved  Dyspnea in the setting of mild pulmonary vascular congestion without hypoxia - Status post Lasix last night as well as this morning, crackles have improved  significantly - Reevaluate cardiac function with a 2-D echo today  HTN - continue to hold cozar and metoprolol due to hypotension - Closely monitor while getting IV Lasix  CAD - s/p of CABG. No chest pain. - hold metoprolol as above - Continue Plavix and Zocor - 2-D echo is pending  HLD - Last LDL was not on record - Continue home medications: Zocor  BPH (benign prostatic hypertrophy) with urinary obstruction - Patient is status post cystoscopy on 5/30 - Dr. Hartley Barefoot is planing to do surgery per pt.  - follow up with Dr. Hartley Barefoot as an outpatient - Continue Flomax  Thrombocytopenia - Likely in the setting of sepsis, continue to monitor - improving  DVT prophylaxis: Lovenox Code Status: Full Family Communication: discussed with son bedside Disposition Plan: transfer to regular floor  Consultants:   None   Procedures:   None   Antimicrobials:  Vancomycin 6/1 >> 6/3  Zosyn 6/1 >> 6/3  Cefepime 6/3 >>    Subjective: - He is feeling a whole lot better this morning, his breathing is improved, his abdominal distention has improved and he appears quite happy   Objective: Filed Vitals:   11/21/15 1132 11/21/15 1453 11/21/15 2245 11/22/15 0610  BP: 153/81 162/87 122/57 149/68  Pulse: 98 108 60 72  Temp: 98.3 F (36.8 C) 98.1 F (36.7 C) 98.6 F (37 C) 99.3 F (37.4 C)  TempSrc: Oral Oral Oral Oral  Resp: 22 20 20 18   Height: 5' 8"  (1.727 m)     Weight: 86.637 kg (191 lb)     SpO2: 94% 93% 98% 95%    Intake/Output Summary (Last 24 hours) at 11/22/15  [place] filed at 11/22/15 0943  Gross per 24 hour  Intake    950 ml  Output   4300 ml  Net  -3350 ml   Filed Weights   11/19/15 1935 11/20/15 0023 11/21/15 1132  Weight: 78.926 kg (174 lb) 81.6 kg (179 lb 14.3 oz) 86.637 kg (191 lb)    Examination: Constitutional: NAD Filed Vitals:   11/21/15 1132 11/21/15 1453 11/21/15 2245 11/22/15 0610  BP: 153/81 162/87 122/57 149/68  Pulse: 98 108  60 72  Temp: 98.3 F (36.8 C) 98.1 F (36.7 C) 98.6 F (37 C) 99.3 F (37.4 C)  TempSrc: Oral Oral Oral Oral  Resp: 22 20 20 18   Height: 5' 8"  (1.727 m)     Weight: 86.637 kg (191 lb)     SpO2: 94% 93% 98% 95%   Eyes: PERRL ENMT: Mucous membranes are moist.  Respiratory: clear to auscultation bilaterally, no wheezing, no crackles. Normal respiratory effort. No accessory muscle use.  Cardiovascular: Regular rate and rhythm, no murmurs / rubs / gallops. No LE edema. Abdomen: no tenderness. Bowel sounds positive.  Musculoskeletal: no clubbing / cyanosis.   Neurologic: non focal  Psychiatric: Normal judgment and insight. Alert and oriented x 3. Normal mood.    Data Reviewed: I have personally reviewed following labs and imaging studies  CBC:  Recent Labs Lab 11/19/15 1939 11/20/15 0440 11/21/15 0217 11/22/15 0423  WBC 11.4* 12.8* 13.8* 10.7*  NEUTROABS 9.2*  --   --   --   HGB 13.5 11.1* 11.9* 11.1*  HCT 40.5 35.3* 37.0* 33.4*  MCV 94.4 96.4 95.1 91.5  PLT 121* 93* 101* 286*   Basic Metabolic Panel:  Recent Labs Lab 11/19/15 1939 11/20/15 0440 11/21/15 0217 11/22/15 0423  NA 138 138 138 139  K 4.3 3.9 4.1 3.4*  CL 104 112* 108 109  CO2 24 22 23 24   GLUCOSE 149* 134* 127* 124*  BUN 22* 18 12 11   CREATININE 1.13 1.05 0.97 0.92  CALCIUM 9.3 7.3* 8.1* 8.2*   GFR: Estimated Creatinine Clearance: 62.9 mL/min (by C-G formula based on Cr of 0.92). Liver Function Tests:  Recent Labs Lab 11/19/15 1939  AST 27  ALT 23  ALKPHOS 35*  BILITOT 0.8  PROT 7.6  ALBUMIN 3.4*   No results for input(s): LIPASE, AMYLASE in the last 168 hours. No results for input(s): AMMONIA in the last 168 hours. Coagulation Profile:  Recent Labs Lab 11/19/15 1939  INR 1.11   Cardiac Enzymes: No results for input(s): CKTOTAL, CKMB, CKMBINDEX, TROPONINI in the last 168 hours. BNP (last 3 results) No results for input(s): PROBNP in the last 8760 hours. HbA1C: No results for  input(s): HGBA1C in the last 72 hours. CBG: No results for input(s): GLUCAP in the last 168 hours. Lipid Profile:  Recent Labs  11/20/15 0440  CHOL 83  HDL 31*  LDLCALC 38  TRIG 72  CHOLHDL 2.7   Thyroid Function Tests: No results for input(s): TSH, T4TOTAL, FREET4, T3FREE, THYROIDAB in the last 72 hours. Anemia Panel: No results for input(s): VITAMINB12, FOLATE, FERRITIN, TIBC, IRON, RETICCTPCT in the last 72 hours. Urine analysis:    Component Value Date/Time   COLORURINE YELLOW 11/19/2015 2030   APPEARANCEUR CLOUDY* 11/19/2015 2030   LABSPEC 1.017 11/19/2015 2030   PHURINE 6.0 11/19/2015 2030   [place] NEGATIVE 11/19/2015 2030   HGBUR LARGE* 11/19/2015 2030   [place] NEGATIVE 11/19/2015 2030   [place] 11/19/2015 2030   PROTEINUR NEGATIVE 11/19/2015  2030   UROBILINOGEN 0.2 05/10/2011 0725   NITRITE NEGATIVE 11/19/2015 2030   LEUKOCYTESUR MODERATE* 11/19/2015 2030   Sepsis Labs: Invalid input(s): PROCALCITONIN, LACTICIDVEN  Recent Results (from the past 240 hour(s))  Blood Culture (routine x 2)     Status: None (Preliminary result)   Collection Time: 11/19/15  8:12 PM  Result Value Ref Range Status   Specimen Description BLOOD LEFT ANTECUBITAL  Final   Special Requests BOTTLES DRAWN AEROBIC AND ANAEROBIC 10CC  Final   Culture NO GROWTH 2 DAYS  Final   Report Status PENDING  Incomplete  Blood Culture (routine x 2)     Status: Abnormal   Collection Time: 11/19/15  8:12 PM  Result Value Ref Range Status   Specimen Description BLOOD RIGHT FOREARM  Final   Special Requests BOTTLES DRAWN AEROBIC AND ANAEROBIC 5CC  Final   Culture  Setup Time   Final    GRAM NEGATIVE RODS AEROBIC BOTTLE ONLY CRITICAL RESULT CALLED TO, READ BACK BY AND VERIFIED WITH: L BAJBUS PHARMD 1927 11/20/15 A BROWNING    Culture PSEUDOMONAS AERUGINOSA (A)  Final   Report Status 11/22/2015 FINAL  Final   Organism ID, Bacteria PSEUDOMONAS AERUGINOSA  Final      Susceptibility    Pseudomonas aeruginosa - MIC*    CEFTAZIDIME 4 SENSITIVE Sensitive     CIPROFLOXACIN <=0.25 SENSITIVE Sensitive     GENTAMICIN <=1 SENSITIVE Sensitive     IMIPENEM 2 SENSITIVE Sensitive     PIP/TAZO 8 SENSITIVE Sensitive     CEFEPIME 2 SENSITIVE Sensitive     * PSEUDOMONAS AERUGINOSA  Blood Culture ID Panel (Reflexed)     Status: Abnormal   Collection Time: 11/19/15  8:12 PM  Result Value Ref Range Status   Enterococcus species NOT DETECTED NOT DETECTED Final   Vancomycin resistance NOT DETECTED NOT DETECTED Final   Listeria monocytogenes NOT DETECTED NOT DETECTED Final   Staphylococcus species NOT DETECTED NOT DETECTED Final   Staphylococcus aureus NOT DETECTED NOT DETECTED Final   Methicillin resistance NOT DETECTED NOT DETECTED Final   Streptococcus species NOT DETECTED NOT DETECTED Final   Streptococcus agalactiae NOT DETECTED NOT DETECTED Final   Streptococcus pneumoniae NOT DETECTED NOT DETECTED Final   Streptococcus pyogenes NOT DETECTED NOT DETECTED Final   Acinetobacter baumannii NOT DETECTED NOT DETECTED Final   Enterobacteriaceae species NOT DETECTED NOT DETECTED Final   Enterobacter cloacae complex NOT DETECTED NOT DETECTED Final   Escherichia coli NOT DETECTED NOT DETECTED Final   Klebsiella oxytoca NOT DETECTED NOT DETECTED Final   Klebsiella pneumoniae NOT DETECTED NOT DETECTED Final   Proteus species NOT DETECTED NOT DETECTED Final   Serratia marcescens NOT DETECTED NOT DETECTED Final   Carbapenem resistance NOT DETECTED NOT DETECTED Final   Haemophilus influenzae NOT DETECTED NOT DETECTED Final   Neisseria meningitidis NOT DETECTED NOT DETECTED Final   Pseudomonas aeruginosa DETECTED (A) NOT DETECTED Final    Comment: CRITICAL RESULT CALLED TO, READ BACK BY AND VERIFIED WITH: L BAJBUS PHARMD 1927 11/20/15 A BROWNING    Candida albicans NOT DETECTED NOT DETECTED Final   Candida glabrata NOT DETECTED NOT DETECTED Final   Candida krusei NOT DETECTED NOT DETECTED  Final   Candida parapsilosis NOT DETECTED NOT DETECTED Final   Candida tropicalis NOT DETECTED NOT DETECTED Final  Urine culture     Status: None   Collection Time: 11/19/15  8:30 PM  Result Value Ref Range Status   Specimen Description URINE, CATHETERIZED  Final  Special Requests NONE  Final   Culture NO GROWTH  Final   Report Status 11/21/2015 FINAL  Final  MRSA PCR Screening     Status: None   Collection Time: 11/20/15 12:39 AM  Result Value Ref Range Status   MRSA by PCR NEGATIVE NEGATIVE Final    Comment:        The GeneXpert MRSA Assay (FDA approved for NASAL specimens only), is one component of a comprehensive MRSA colonization surveillance program. It is not intended to diagnose MRSA infection nor to guide or monitor treatment for MRSA infections.       Radiology Studies: Dg Chest Port 1 View  11/21/2015  CLINICAL DATA:  Acute onset of expiratory wheezing and abdominal distention. Dysuria and fever. Initial encounter. EXAM: PORTABLE CHEST 1 VIEW COMPARISON:  Chest radiograph from 11/19/2015 FINDINGS: The lungs are well-aerated. Mild vascular congestion is noted. Mild right basilar opacity may reflect atelectasis or mild pneumonia. Asymmetric interstitial edema might have a similar appearance, depending on the patient's symptoms. There is no evidence of pleural effusion or pneumothorax. The cardiomediastinal silhouette is borderline normal in size. The patient is status post median sternotomy, with evidence of prior CABG. No acute osseous abnormalities are seen. IMPRESSION: Mild vascular congestion noted. Mild right basilar opacity may reflect atelectasis or mild pneumonia. Asymmetric interstitial edema might have a similar appearance, depending on the patient's symptoms. Electronically Signed   By: Garald Balding M.D.   On: 11/21/2015 18:12   Dg Abd Portable 1v  11/21/2015  CLINICAL DATA:  Acute onset of dysuria and fever. Abdominal distention. Initial encounter. EXAM:  PORTABLE ABDOMEN - 1 VIEW COMPARISON:  CT of the abdomen and pelvis from 10/09/2015 FINDINGS: The visualized bowel gas pattern is unremarkable. Scattered air and stool filled loops of colon are seen; no abnormal dilatation of small bowel loops is seen to suggest small bowel obstruction. No free intra-abdominal air is identified, though evaluation for free air is limited on a single supine view. Mild degenerative change is noted at the lower lumbar spine; the sacroiliac joints are unremarkable in appearance. An IVC filter is noted. IMPRESSION: Unremarkable bowel gas pattern; no free intra-abdominal air seen. Small amount of stool noted in the colon. Electronically Signed   By: Garald Balding M.D.   On: 11/21/2015 18:12     Scheduled Meds: . calcium carbonate  1,250 mg Oral BID WC  . ceFEPime (MAXIPIME) IV  2 g Intravenous Q8H  . cholecalciferol  1,000 Units Oral Daily  . clopidogrel  75 mg Oral QODAY  . enoxaparin (LOVENOX) injection  40 mg Subcutaneous QHS  . multivitamin with minerals  1 tablet Oral Daily  . omega-3 acid ethyl esters  1 g Oral Daily  . potassium chloride  30 mEq Oral Q3H  . simvastatin  20 mg Oral QHS  . sodium chloride flush  3 mL Intravenous Q12H  . tamsulosin  0.4 mg Oral QPC supper   Continuous Infusions:    Marzetta Board, MD, PhD [facility] Pager [phone]  If 7PM-7AM, please contact night-coverage [email or web address] Password [email or web address] 11/22/2015, 11:14 AM

## 2015-11-23 ENCOUNTER — Inpatient Hospital Stay (HOSPITAL_COMMUNITY): Payer: Medicare Other

## 2015-11-23 DIAGNOSIS — R0600 Dyspnea, unspecified: Secondary | ICD-10-CM

## 2015-11-23 LAB — ECHOCARDIOGRAM COMPLETE
CHL CUP MV DEC (S): 405
CHL CUP TV REG PEAK VELOCITY: 267 cm/s
E decel time: 405 msec
FS: 30 % (ref 28–44)
Height: 68 in
IV/PV OW: 0.75
LA ID, A-P, ES: 38 cm
LA vol: 37.9 cm3
LADIAMINDEX: 1.9 cm/m2
LAVOLA4C: 48.6 mL
LAVOLIN: 19 mL/m2
LDCA: 2.84 cm2
LEFT ATRIUM END SYS DIAM: 38 cm
LV TDI E'LATERAL: 10.2
LV TDI E'MEDIAL: 6.31
LVELAT: 10.2 cm/s
LVOTD: 19 cm
MV pk E vel: 0.8 m/s
PW: 12 mm — AB (ref 0.6–1.1)
TR max vel: 267 m/s
Weight: 3056 oz

## 2015-11-23 LAB — CBC
HCT: 33.9 % — ABNORMAL LOW (ref 39.0–52.0)
HEMOGLOBIN: 11.2 g/dL — AB (ref 13.0–17.0)
MCH: 30.3 pg (ref 26.0–34.0)
MCHC: 33 g/dL (ref 30.0–36.0)
MCV: 91.6 fL (ref 78.0–100.0)
Platelets: 136 10*3/uL — ABNORMAL LOW (ref 150–400)
RBC: 3.7 MIL/uL — AB (ref 4.22–5.81)
RDW: 14.5 % (ref 11.5–15.5)
WBC: 8.7 10*3/uL (ref 4.0–10.5)

## 2015-11-23 LAB — BASIC METABOLIC PANEL
Anion gap: 4 — ABNORMAL LOW (ref 5–15)
BUN: 14 mg/dL (ref 6–20)
CHLORIDE: 107 mmol/L (ref 101–111)
CO2: 27 mmol/L (ref 22–32)
Calcium: 8.4 mg/dL — ABNORMAL LOW (ref 8.9–10.3)
Creatinine, Ser: 0.81 mg/dL (ref 0.61–1.24)
GFR calc non Af Amer: >60 mL/min (ref >60)
Glucose, Bld: 127 mg/dL — ABNORMAL HIGH (ref 65–99)
POTASSIUM: 3.6 mmol/L (ref 3.5–5.1)
SODIUM: 138 mmol/L (ref 135–145)

## 2015-11-23 MED ORDER — LEVOFLOXACIN 500 MG PO TABS
500.0000 mg | ORAL_TABLET | Freq: Every day | ORAL | Status: DC
  Administered 2015-11-23: 500 mg via ORAL
  Filled 2015-11-23: qty 1

## 2015-11-23 MED ORDER — LEVOFLOXACIN 500 MG PO TABS: 500.0000 mg | ORAL_TABLET | Freq: Every day | ORAL | Status: DC

## 2015-11-23 NOTE — Care Management Important Message (Signed)
Important Message  Patient Details  Name: Charles Terry MRN: [id number] Date of Birth: October 12, 1927   Medicare Important Message Given:  Yes    Loann Quill 11/23/2015, 12:09 PM

## 2015-11-23 NOTE — Discharge Instructions (Signed)
Follow with Horatio Pel, MD in 5-7 days  Please get a complete blood count and chemistry panel checked by your Primary MD at your next visit, and again as instructed by your Primary MD. Please get your medications reviewed and adjusted by your Primary MD.  Please request your Primary MD to go over all Hospital Tests and Procedure/Radiological results at the follow up, please get all Hospital records sent to your Prim MD by signing hospital release before you go home.  If you had Pneumonia of Lung problems at the Hospital: Please get a 2 view Chest X ray done in 6-8 weeks after hospital discharge or sooner if instructed by your Primary MD.  If you have Congestive Heart Failure: Please call your Cardiologist or Primary MD anytime you have any of the following symptoms:  1) 3 pound weight gain in 24 hours or 5 pounds in 1 week  2) shortness of breath, with or without a dry hacking cough  3) swelling in the hands, feet or stomach  4) if you have to sleep on extra pillows at night in order to breathe  Follow cardiac low salt diet and 1.5 lit/day fluid restriction.  If you have diabetes Accuchecks 4 times/day, Once in AM empty stomach and then before each meal. Log in all results and show them to your primary doctor at your next visit. If any glucose reading is under 80 or above 300 call your primary MD immediately.  If you have Seizure/Convulsions/Epilepsy: Please do not drive, operate heavy machinery, participate in activities at heights or participate in high speed sports until you have seen by Primary MD or a Neurologist and advised to do so again.  If you had Gastrointestinal Bleeding: Please ask your Primary MD to check a complete blood count within one week of discharge or at your next visit. Your endoscopic/colonoscopic biopsies that are pending at the time of discharge, will also need to followed by your Primary MD.  Get Medicines reviewed and adjusted. Please take all your  medications with you for your next visit with your Primary MD  Please request your Primary MD to go over all hospital tests and procedure/radiological results at the follow up, please ask your Primary MD to get all Hospital records sent to his/her office.  If you experience worsening of your admission symptoms, develop shortness of breath, life threatening emergency, suicidal or homicidal thoughts you must seek medical attention immediately by calling 911 or calling your MD immediately  if symptoms less severe.  You must read complete instructions/literature along with all the possible adverse reactions/side effects for all the Medicines you take and that have been prescribed to you. Take any new Medicines after you have completely understood and accpet all the possible adverse reactions/side effects.   Do not drive or operate heavy machinery when taking Pain medications.   Do not take more than prescribed Pain, Sleep and Anxiety Medications  Special Instructions: If you have smoked or chewed Tobacco  in the last 2 yrs please stop smoking, stop any regular Alcohol  and or any Recreational drug use.  Wear Seat belts while driving.  Please note You were cared for by a hospitalist during your hospital stay. If you have any questions about your discharge medications or the care you received while you were in the hospital after you are discharged, you can call the unit and asked to speak with the hospitalist on call if the hospitalist that took care of you is not available. Once  you are discharged, your primary care physician will handle any further medical issues. Please note that NO REFILLS for any discharge medications will be authorized once you are discharged, as it is imperative that you return to your primary care physician (or establish a relationship with a primary care physician if you do not have one) for your aftercare needs so that they can reassess your need for medications and monitor your  lab values.  You can reach the hospitalist office at phone [phone] or fax [phone]   If you do not have a primary care physician, you can call [phone] for a physician referral.  Activity: As tolerated with Full fall precautions use walker/cane & assistance as needed  Diet: regular  Disposition Home

## 2015-11-23 NOTE — Progress Notes (Signed)
  Echocardiogram 2D Echocardiogram has been performed.  Charles Terry 11/23/2015, 11:20 AM

## 2015-11-23 NOTE — Progress Notes (Signed)
Pt discharged to home.  Foley re-inserted per MD before discharge.  Pt connected to leg bag and shown how to connect and disconnect leg bad and standard drainage bag and also shown how to empty bags.  Discharge instructions explained to pt.  Pt has no questions at the time of discharge. Pt states he has all belongings.  Pt taken off unit in wheelchair by staff.

## 2015-11-23 NOTE — Discharge Summary (Signed)
Physician Discharge Summary  Charles Terry:[id number] DOB: 1927/08/29 DOA: 11/19/2015  PCP: Horatio Pel, MD  Admit date: 11/19/2015 Discharge date: 11/23/2015  Time spent: > 30 minutes  Recommendations for Outpatient Follow-up:  1. Follow up with Dr. Shelia Media next week as scheduled 2. Follow up with Dr. Gaynelle Arabian as scheduled 3. Levofloxacin for 7 additional days   Discharge Diagnoses:  Principal Problem:   UTI (lower urinary tract infection) Active Problems:   History of AAA (abdominal aortic aneurysm) repair-'06   S/P CABG x 5   Essential hypertension   BPH (benign prostatic hypertrophy) with urinary obstruction-surg 11/12   S/P IVC filter - placed electively prior to prostate surgery 11/12   Dyslipidemia   Sepsis ([place])   Bacteremia due to Pseudomonas  Discharge Condition: stable  Diet recommendation: heart healthy  Filed Weights   11/19/15 1935 11/20/15 0023 11/21/15 1132  Weight: 78.926 kg (174 lb) 81.6 kg (179 lb 14.3 oz) 86.637 kg (191 lb)    History of present illness:  See H&P, Labs, Consult and Test reports for all details in brief, patient is a 82 year old male with hypertension, hyperlipidemia, CAD, CABG, DVT (s/p of IVC filter), BPH, AAA (s/p repair in 2006), newly diagnosed thoracic aorta aneursyms (being watched by vascular surgery w/ plan to re-image in 6 months), who presents with dysuria, fever and chills. The patient reported that he was having fever, chills, dysuria. He had hematuria 2 weeks ago and was seen by Dr. Era Bumpers, urology. Per patient, Dr. Minus Liberty had done a procedure on Tuesday with plans to do elective prostate procedure in future (per wife, not scheduled yet). UA positive for UTI. Patient was admitted for sepsis secondary to UTI.   Hospital Course:  Sepsis due to Pseudomonas bacteremia / urinary tract infection - Patient met SIRS criteria with hypotension, fever, leukocytosis and elevated lactate. Sepsis physiology resolved with  antibiotics initially on Vancomycin and Zosyn then transitioned to Cefepime once blood cultures one out of 2 bottles positive for Pseudomonas. He improved, returned to baseline and based on antibiogram he was transitioned to Levaquin to complete 7 additional days. Case discussed with ID Dr. Linus Salmons over the phone. ID reflex panel also positive for Pseudomonas. Surveillance blood cultures obtained on 6/3 are negative to date. Leukocytosis resolved.  Dyspnea in the setting of mild pulmonary vascular congestion without hypoxia - following initial fluid resuscitation in the setting of sepsis patient had crackles and was dyspneic. He received Lasix x 2 with improvement in his respiratory status. He underwent a 2D echo which showed normal EF 60-65%, grade 1 DD. Echo appears improved compared to prior findings as documented in cardiology notes as an outpatient.  HTN - resume home medications CAD - s/p of CABG. No chest pain. Continue Plavix and Zocor. 2-D echo as below. HLD - Continue home medications: Zocor BPH (benign prostatic hypertrophy) with urinary obstruction - Patient is status post cystoscopy on 5/30, patient failed a voiding trial and will be discharged with a Foley. He did require Foley at home in the past. Follow up with Dr. Hartley Barefoot as an outpatient. Continue Flomax Thrombocytopenia - Likely in the setting of sepsis, improving  Procedures:  2D echo Study Conclusions - Left ventricle: The cavity size was normal. There was mild concentric hypertrophy. Systolic function was normal. The estimated ejection fraction was in the range of 60% to 65%. Wall motion was normal; there were no regional wall motion abnormalities. Doppler parameters are consistent with abnormal left ventricular relaxation (grade 1 diastolic  dysfunction). There was no evidence of elevated ventricular filling pressure by Doppler parameters. - Aortic valve: Valve mobility was restricted. There was mild regurgitation. - Aortic root:  The aortic root was normal in size. - Mitral valve: Calcified annulus. Mildly thickened leaflets. There was no regurgitation. - Left atrium: The atrium was normal in size. - Right ventricle: The cavity size was mildly dilated. Wall thickness was normal. Systolic function was moderately reduced. - Right atrium: The atrium was normal in size. - Tricuspid valve: There was mild regurgitation. - Pulmonic valve: There was trivial regurgitation. - Pulmonary arteries: Systolic pressure was mildly increased. PA peak pressure: 37 mm Hg (S). - Inferior vena cava: The vessel was normal in size. - Pericardium, extracardiac: There was no pericardial effusion.   Consultations:  None   Discharge Exam: Filed Vitals:   11/22/15 1514 11/22/15 2203 11/23/15 0700 11/23/15 1358  BP: 123/59 153/66 147/72 162/79  Pulse: 76 70 67 70  Temp: 98.5 F (36.9 C) 99 F (37.2 C) 99.1 F (37.3 C) 98.2 F (36.8 C)  TempSrc: Oral Oral Oral Oral  Resp: _0 Height:      Weight:      SpO2: 93% 96% 95% 98%    General: NAD Cardiovascular: RRR Respiratory: CTA biL  Discharge Instructions Activity:  As tolerated   Get Medicines reviewed and adjusted: Please take all your medications with you for your next visit with your Primary MD  Please request your Primary MD to go over all hospital tests and procedure/radiological results at the follow up, please ask your Primary MD to get all Hospital records sent to his/her office.  If you experience worsening of your admission symptoms, develop shortness of breath, life threatening emergency, suicidal or homicidal thoughts you must seek medical attention immediately by calling 911 or calling your MD immediately if symptoms less severe.  You must read complete instructions/literature along with all the possible adverse reactions/side effects for all the Medicines you take and that have been prescribed to you. Take any new Medicines after you have completely  understood and accpet all the possible adverse reactions/side effects.   Do not drive when taking Pain medications.   Do not take more than prescribed Pain, Sleep and Anxiety Medications  Special Instructions: If you have smoked or chewed Tobacco in the last 2 yrs please stop smoking, stop any regular Alcohol and or any Recreational drug use.  Wear Seat belts while driving.  Please note  You were cared for by a hospitalist during your hospital stay. Once you are discharged, your primary care physician will handle any further medical issues. Please note that NO REFILLS for any discharge medications will be authorized once you are discharged, as it is imperative that you return to your primary care physician (or establish a relationship with a primary care physician if you do not have one) for your aftercare needs so that they can reassess your need for medications and monitor your lab values.    Medication List    TAKE these medications        CALTRATE 600 PO  Take 600 mg by mouth 2 (two) times daily.     clopidogrel 75 MG tablet  Commonly known as:  PLAVIX  Take 1 tablet (75 mg total) by mouth every other day.     D3-1000 PO  Take 1,000 Units by mouth daily.     fish oil-omega-3 fatty acids 1000 MG capsule  Take 1 g by mouth daily.  levofloxacin 500 MG tablet  Commonly known as:  LEVAQUIN  Take 1 tablet (500 mg total) by mouth daily.     losartan 100 MG tablet  Commonly known as:  COZAAR  Take 0.5 tablets (50 mg total) by mouth daily. NEEDS APPOINTMENT FOR FUTURE REFILLS     metoprolol tartrate 25 MG tablet  Commonly known as:  LOPRESSOR  Take 12.5 mg by mouth 2 (two) times daily.     multivitamins ther. w/minerals Tabs tablet  Take 1 tablet by mouth daily.     simvastatin 40 MG tablet  Commonly known as:  ZOCOR  Take 20 mg by mouth at bedtime.     tamsulosin 0.4 MG Caps capsule  Commonly known as:  FLOMAX  Take 0.4 mg by mouth daily after supper.             Follow-up Information    Follow up with Horatio Pel, MD In 1 week.   Specialty:  Internal Medicine   Contact information:   [address] [place] [address] [phone]       Follow up with Ailene Rud, MD. Schedule an appointment as soon as possible for a visit in 1 week.   Specialty:  Urology   Contact information:   [address] [place] [address] [phone]       The results of significant diagnostics from this hospitalization (including imaging, microbiology, ancillary and laboratory) are listed below for reference.    Significant Diagnostic Studies: Dg Chest 2 View  11/19/2015  CLINICAL DATA:  Acute onset of cough and fever. Shakiness and vomiting. Initial encounter. EXAM: CHEST  2 VIEW COMPARISON:  Chest radiograph from 01/02/2015 FINDINGS: The lungs are well-aerated. Vascular congestion is noted. Mild bibasilar opacities likely reflect atelectasis, though mild pneumonia might have a similar appearance. There is no evidence of pleural effusion or pneumothorax. The heart is borderline normal in size. The patient is status post median sternotomy, with evidence of prior CABG. No acute osseous abnormalities are seen. IMPRESSION: Vascular congestion noted. Mild bibasilar opacities likely reflect atelectasis, though mild pneumonia might have a similar appearance. Electronically Signed   By: Garald Balding M.D.   On: 11/19/2015 21:15   Dg Chest Port 1 View  11/21/2015  CLINICAL DATA:  Acute onset of expiratory wheezing and abdominal distention. Dysuria and fever. Initial encounter. EXAM: PORTABLE CHEST 1 VIEW COMPARISON:  Chest radiograph from 11/19/2015 FINDINGS: The lungs are well-aerated. Mild vascular congestion is noted. Mild right basilar opacity may reflect atelectasis or mild pneumonia. Asymmetric interstitial edema might have a similar appearance, depending on the patient's symptoms. There is no evidence of pleural effusion or  pneumothorax. The cardiomediastinal silhouette is borderline normal in size. The patient is status post median sternotomy, with evidence of prior CABG. No acute osseous abnormalities are seen. IMPRESSION: Mild vascular congestion noted. Mild right basilar opacity may reflect atelectasis or mild pneumonia. Asymmetric interstitial edema might have a similar appearance, depending on the patient's symptoms. Electronically Signed   By: Garald Balding M.D.   On: 11/21/2015 18:12   Dg Abd Portable 1v  11/21/2015  CLINICAL DATA:  Acute onset of dysuria and fever. Abdominal distention. Initial encounter. EXAM: PORTABLE ABDOMEN - 1 VIEW COMPARISON:  CT of the abdomen and pelvis from 10/09/2015 FINDINGS: The visualized bowel gas pattern is unremarkable. Scattered air and stool filled loops of colon are seen; no abnormal dilatation of small bowel loops is seen to suggest small bowel obstruction. No free  intra-abdominal air is identified, though evaluation for free air is limited on a single supine view. Mild degenerative change is noted at the lower lumbar spine; the sacroiliac joints are unremarkable in appearance. An IVC filter is noted. IMPRESSION: Unremarkable bowel gas pattern; no free intra-abdominal air seen. Small amount of stool noted in the colon. Electronically Signed   By: Garald Balding M.D.   On: 11/21/2015 18:12    Microbiology: Recent Results (from the past 240 hour(s))  Blood Culture (routine x 2)     Status: None (Preliminary result)   Collection Time: 11/19/15  8:12 PM  Result Value Ref Range Status   Specimen Description BLOOD LEFT ANTECUBITAL  Final   Special Requests BOTTLES DRAWN AEROBIC AND ANAEROBIC 10CC  Final   Culture NO GROWTH 4 DAYS  Final   Report Status PENDING  Incomplete  Blood Culture (routine x 2)     Status: Abnormal   Collection Time: 11/19/15  8:12 PM  Result Value Ref Range Status   Specimen Description BLOOD RIGHT FOREARM  Final   Special Requests BOTTLES DRAWN AEROBIC  AND ANAEROBIC 5CC  Final   Culture  Setup Time   Final    GRAM NEGATIVE RODS AEROBIC BOTTLE ONLY CRITICAL RESULT CALLED TO, READ BACK BY AND VERIFIED WITH: L BAJBUS PHARMD 1927 11/20/15 A BROWNING    Culture PSEUDOMONAS AERUGINOSA (A)  Final   Report Status 11/22/2015 FINAL  Final   Organism ID, Bacteria PSEUDOMONAS AERUGINOSA  Final      Susceptibility   Pseudomonas aeruginosa - MIC*    CEFTAZIDIME 4 SENSITIVE Sensitive     CIPROFLOXACIN <=0.25 SENSITIVE Sensitive     GENTAMICIN <=1 SENSITIVE Sensitive     IMIPENEM 2 SENSITIVE Sensitive     PIP/TAZO 8 SENSITIVE Sensitive     CEFEPIME 2 SENSITIVE Sensitive     * PSEUDOMONAS AERUGINOSA  Blood Culture ID Panel (Reflexed)     Status: Abnormal   Collection Time: 11/19/15  8:12 PM  Result Value Ref Range Status   Enterococcus species NOT DETECTED NOT DETECTED Final   Vancomycin resistance NOT DETECTED NOT DETECTED Final   Listeria monocytogenes NOT DETECTED NOT DETECTED Final   Staphylococcus species NOT DETECTED NOT DETECTED Final   Staphylococcus aureus NOT DETECTED NOT DETECTED Final   Methicillin resistance NOT DETECTED NOT DETECTED Final   Streptococcus species NOT DETECTED NOT DETECTED Final   Streptococcus agalactiae NOT DETECTED NOT DETECTED Final   Streptococcus pneumoniae NOT DETECTED NOT DETECTED Final   Streptococcus pyogenes NOT DETECTED NOT DETECTED Final   Acinetobacter baumannii NOT DETECTED NOT DETECTED Final   Enterobacteriaceae species NOT DETECTED NOT DETECTED Final   Enterobacter cloacae complex NOT DETECTED NOT DETECTED Final   Escherichia coli NOT DETECTED NOT DETECTED Final   Klebsiella oxytoca NOT DETECTED NOT DETECTED Final   Klebsiella pneumoniae NOT DETECTED NOT DETECTED Final   Proteus species NOT DETECTED NOT DETECTED Final   Serratia marcescens NOT DETECTED NOT DETECTED Final   Carbapenem resistance NOT DETECTED NOT DETECTED Final   Haemophilus influenzae NOT DETECTED NOT DETECTED Final   Neisseria  meningitidis NOT DETECTED NOT DETECTED Final   Pseudomonas aeruginosa DETECTED (A) NOT DETECTED Final    Comment: CRITICAL RESULT CALLED TO, READ BACK BY AND VERIFIED WITH: L BAJBUS PHARMD 1927 11/20/15 A BROWNING    Candida albicans NOT DETECTED NOT DETECTED Final   Candida glabrata NOT DETECTED NOT DETECTED Final   Candida krusei NOT DETECTED NOT DETECTED Final   Candida parapsilosis  NOT DETECTED NOT DETECTED Final   Candida tropicalis NOT DETECTED NOT DETECTED Final  Urine culture     Status: None   Collection Time: 11/19/15  8:30 PM  Result Value Ref Range Status   Specimen Description URINE, CATHETERIZED  Final   Special Requests NONE  Final   Culture NO GROWTH  Final   Report Status 11/21/2015 FINAL  Final  MRSA PCR Screening     Status: None   Collection Time: 11/20/15 12:39 AM  Result Value Ref Range Status   MRSA by PCR NEGATIVE NEGATIVE Final    Comment:        The GeneXpert MRSA Assay (FDA approved for NASAL specimens only), is one component of a comprehensive MRSA colonization surveillance program. It is not intended to diagnose MRSA infection nor to guide or monitor treatment for MRSA infections.   Culture, blood (Routine X 2) w Reflex to ID Panel     Status: None (Preliminary result)   Collection Time: 11/21/15 11:28 AM  Result Value Ref Range Status   Specimen Description BLOOD LEFT HAND  Final   Special Requests BOTTLES DRAWN AEROBIC AND ANAEROBIC 5CC  Final   Culture NO GROWTH 2 DAYS  Final   Report Status PENDING  Incomplete  Culture, blood (Routine X 2) w Reflex to ID Panel     Status: None (Preliminary result)   Collection Time: 11/21/15 11:31 AM  Result Value Ref Range Status   Specimen Description BLOOD RIGHT HAND  Final   Special Requests BOTTLES DRAWN AEROBIC AND ANAEROBIC 5CC  Final   Culture NO GROWTH 2 DAYS  Final   Report Status PENDING  Incomplete     Labs: Basic Metabolic Panel:  Recent Labs Lab 11/19/15 1939 11/20/15 0440  11/21/15 0217 11/22/15 0423 11/23/15 0553  NA 138 138 138 139 138  K 4.3 3.9 4.1 3.4* 3.6  CL 104 112* 108 109 107  CO2 _0 GLUCOSE 149* 134* 127* 124* 127*  BUN 22* _1 CREATININE 1.13 1.05 0.97 0.92 0.81  CALCIUM 9.3 7.3* 8.1* 8.2* 8.4*   Liver Function Tests:  Recent Labs Lab 11/19/15 1939  AST 27  ALT 23  ALKPHOS 35*  BILITOT 0.8  PROT 7.6  ALBUMIN 3.4*   CBC:  Recent Labs Lab 11/19/15 1939 11/20/15 0440 11/21/15 0217 11/22/15 0423 11/23/15 0553  WBC 11.4* 12.8* 13.8* 10.7* 8.7  NEUTROABS 9.2*  --   --   --   --   HGB 13.5 11.1* 11.9* 11.1* 11.2*  HCT 40.5 35.3* 37.0* 33.4* 33.9*  MCV 94.4 96.4 95.1 91.5 91.6  PLT 121* 93* 101* 112* 136*     Signed:  ,   [facility] 11/23/2015, 5:51 PM

## 2015-11-24 LAB — CULTURE, BLOOD (ROUTINE X 2): Culture: NO GROWTH

## 2015-11-26 LAB — CULTURE, BLOOD (ROUTINE X 2)
CULTURE: NO GROWTH
Culture: NO GROWTH

## 2015-11-30 DIAGNOSIS — E785 Hyperlipidemia, unspecified: Secondary | ICD-10-CM | POA: Diagnosis not present

## 2015-11-30 DIAGNOSIS — I10 Essential (primary) hypertension: Secondary | ICD-10-CM | POA: Diagnosis not present

## 2015-11-30 DIAGNOSIS — Z9079 Acquired absence of other genital organ(s): Secondary | ICD-10-CM | POA: Diagnosis not present

## 2015-11-30 DIAGNOSIS — Z0001 Encounter for general adult medical examination with abnormal findings: Secondary | ICD-10-CM | POA: Diagnosis not present

## 2015-11-30 DIAGNOSIS — E119 Type 2 diabetes mellitus without complications: Secondary | ICD-10-CM | POA: Diagnosis not present

## 2015-12-01 DIAGNOSIS — L570 Actinic keratosis: Secondary | ICD-10-CM | POA: Diagnosis not present

## 2015-12-01 DIAGNOSIS — D485 Neoplasm of uncertain behavior of skin: Secondary | ICD-10-CM | POA: Diagnosis not present

## 2015-12-01 DIAGNOSIS — L820 Inflamed seborrheic keratosis: Secondary | ICD-10-CM | POA: Diagnosis not present

## 2015-12-02 ENCOUNTER — Telehealth: Payer: Self-pay | Admitting: Cardiology

## 2015-12-02 NOTE — Telephone Encounter (Signed)
New message      Request for surgical clearance:  1. What type of surgery is being performed? turt  2. When is this surgery scheduled? pending  3. Are there any medications that need to be held prior to surgery and how long? Just needs cardiology approval  4. Name of physician performing surgery? Dr. Gaynelle Arabian  5. What is your office phone and fax number? Fax number [phone] number [phone]

## 2015-12-02 NOTE — Telephone Encounter (Signed)
Pt last seen 12/2014 w recommendation for 1 yr f/u.  Needs TURT clearance (see below). Currently on plavix, will need instruction on this as well.

## 2015-12-03 DIAGNOSIS — R339 Retention of urine, unspecified: Secondary | ICD-10-CM | POA: Diagnosis not present

## 2015-12-03 NOTE — Telephone Encounter (Signed)
I would probably say that he is low to moderate risk for a low risk procedure (but have not seem him in > 1 yr) Would be nice to see him before saying this.  OK to stop Plavix.   Glenetta Hew, MD

## 2015-12-04 NOTE — Telephone Encounter (Signed)
Recommendations faxed to Dr. Arlyn Leak office.

## 2015-12-07 ENCOUNTER — Other Ambulatory Visit: Payer: Self-pay | Admitting: Cardiology

## 2015-12-08 ENCOUNTER — Ambulatory Visit (INDEPENDENT_AMBULATORY_CARE_PROVIDER_SITE_OTHER): Payer: Medicare Other | Admitting: Cardiology

## 2015-12-08 VITALS — BP 117/70 | HR 54 | Ht 67.0 in | Wt 169.4 lb

## 2015-12-08 DIAGNOSIS — I10 Essential (primary) hypertension: Secondary | ICD-10-CM

## 2015-12-08 DIAGNOSIS — I2510 Atherosclerotic heart disease of native coronary artery without angina pectoris: Secondary | ICD-10-CM

## 2015-12-08 DIAGNOSIS — Z01810 Encounter for preprocedural cardiovascular examination: Secondary | ICD-10-CM

## 2015-12-08 DIAGNOSIS — I358 Other nonrheumatic aortic valve disorders: Secondary | ICD-10-CM | POA: Diagnosis not present

## 2015-12-08 DIAGNOSIS — Z951 Presence of aortocoronary bypass graft: Secondary | ICD-10-CM

## 2015-12-08 MED ORDER — LOSARTAN POTASSIUM 100 MG PO TABS: ORAL_TABLET | ORAL | Status: DC

## 2015-12-08 NOTE — Patient Instructions (Signed)
Clearance for prostate surgery  May hold  plavix 5-7 days prior to surgery.   No other changes with current medications  Schedule in June 2018 Your physician has requested that you have en exercise stress myoview. For further information please visit [email or web address]. Please follow instruction sheet, as given.    Your physician wants you to follow-up in:12 months with Dr Charles Terry.- after Charles Terry will receive a reminder letter in the mail two months in advance. If you don't receive a letter, please call our office to schedule the follow-up appointment.

## 2015-12-08 NOTE — Progress Notes (Signed)
PCP: Horatio Pel, MD  Clinic Note: Chief Complaint  Patient presents with  . Medical Clearance    Pt states no Sx.  . Coronary Artery Disease    CABG    HPI: Charles Terry is a 82 y.o. male with a PMH below who presents today for annual follow-up for CAD. He is in need of preoperative cardiology evaluation. As you recall is a very pleasant gentleman with a history of CABG x5 in 2005 (LIMA-LAD, sequential SVG-OM1-OM 2, rectal SVG-RPDA-LPL) in response to a catheterization showing 100% LAD occlusion, 90% proximal circumflex - 80-90% OM1 as well as totally occluded RCA. His most recent stress test was in May 2012 which was normal. An echocardiogram at that time as well that showed an EF of 45-50% with anterior hypokinesis. Mild aortic valve sclerosis. He also has a history of AAA repair with a Hemashield graft 14 limited millimeters in 2006 by Dr. Donnetta Hutching. - He also has a history of DVT with IVC filter placement.  His CAD was actually found surreptitiously with an abnormal EKG leading to a nuclear stress test showing ischemia. This led to cardiac catheterization revealing multivessel CAD. He never really had any anginal symptoms.  ENDI BESON was last seen on July 2016. This is actually in response to the [facility] emergency room visit for chest pain. He described sharp right-sided chest pain that he noted first going downstairs. It only lasts a few seconds it was thought to be related to muscle skeletal pain. He ruled out for MI was discharged. When I saw him in clinic follow-up, he not had any further symptoms. Activity level was limited by ankle injury.  He is just seen Dr.Early earlier on May - apparently the patient had an abdominal CT showing 4.8 cm thoracic aneurysm. Plans for follow-up CT scan in 6 months. He is felt to be a prohibitive risk for open aneurysmal surgery, but may be a candidate for stent graft repair.  Recent Hospitalizations: November 19, 2015 - Sepsis - UTI  related (related to nephrolithiasis) - has indwelling catheter now.  Out in 4 days.  Studies Reviewed:   2-D echocardiogram 11/23/2015: EF 60-65%. No regional wall motion abnormality. Grade 1 diastolic dysfunction. Mildly reduced RV function. Mild elevation of pulmonary pressures  Interval History: [name] presents for preoperative evaluation for prostate surgery. He comes in a little bit early, because it been almost a year since I last saw him. He continues to be active without any major complaints. He walks about a mile almost every day but it least every other day. With his activity he denies any chest tightness or pressure with rest or exertion. He denies any resting or exertional dyspnea besides mom would expect when he exercises. He has a little bit of trouble getting himself up in running, but once he gets going he does okay. He may have little bit of mild edema, but denies any PND or orthopnea. No palpitations, lightheadedness, dizziness, weakness or syncope/near syncope. No TIA/amaurosis fugax symptoms. No melena, hematochezia, hematuria, or epstaxis. No claudication.  ROS: A comprehensive was performed. Review of Systems  Constitutional: Positive for malaise/fatigue (Still recovering from his hospital stay with sepsis).  HENT: Negative for nosebleeds.   Eyes: Negative for blurred vision.  Respiratory: Negative for cough, shortness of breath and wheezing.   Cardiovascular:       Per history of present illness  Gastrointestinal: Negative for blood in stool and melena.  Genitourinary: Negative for dysuria, urgency, hematuria (No  obvious hematuria that he can tell since his UTI) and flank pain.  Musculoskeletal: Positive for joint pain. Negative for falls.  Neurological: Positive for dizziness (Less so now he is recovering from his hospital stay.). Negative for focal weakness and headaches.  Endo/Heme/Allergies: Bruises/bleeds easily.  Psychiatric/Behavioral: Negative for depression  and memory loss. The patient is not nervous/anxious.   All other systems reviewed and are negative.   Past Medical History  Diagnosis Date  . History of AAA (abdominal aortic aneurysm) repair 2006    AAA R&G Hemasheild 14 mm x 10 mm (Dr. Donnetta Hutching);; CT scan in 2013 shows 4.8 cm thoracic aortic aneurysm - the followed by Dr. Donnetta Hutching  . History of blood clots     right lower extremity dvt  after inguinal hernia surgery may 2012--doppler follow up report 04/07/11 at [facility] heart and vaxcular shows resolution of the dvt--but pt is to have IVC filter placed 05/05/11--prior to planned prostate surgery scheduled for 05/09/11.  . Coronary artery disease involving native coronary artery 2005    100% LAD, 90% proximal circumflex followed by 80% OM1, 100% RCA. Right PDA and left DL in a codominant system --> CABG '05. low risk Nuc 5/12  . S/P CABG x 5 2005     LIMA-LAD, SeqSVG-OM1-OM2, Seq SVG- RPDA-LPL  . Essential hypertension   . Renal cyst may 2012    bleeding from right renal cyst /acute renal failure secondary to urinary retention from bph --pt states  the cyst is no longer a problem--pt still has indwelling foley catheter--plans  open  suprapubic prostatectomy on 11/19 with dr. Gaynelle Arabian  . BPH (benign prostatic hypertrophy) with urinary obstruction 11/12    surg  . History of hematuria   . Dyslipidemia   . H/O transfusion of packed red blood cells MAY 2012  . History of DVT of lower extremity     right lower extremity dvt  after inguinal hernia surgery may 2012--doppler follow up report 04/07/11 at [facility] heart and vaxcular shows resolution of the dvt--but pt is to have IVC filter placed 05/05/11--prior to planned prostate surgery scheduled for 05/09/11.  . S/P insertion of IVC (inferior vena caval) filter 05/05/11    prior to prostate surgery    Past Surgical History  Procedure Laterality Date  . Aaa rpair  03/29/2005    14x4mm Hemashield graft  . Hernia repair Left     MAY  2012  . Prostatectomy  05/09/2011    Procedure: PROSTATECTOMY SUPRAPUBIC;  Surgeon: Ailene Rud, MD;  Location: [facility]S;  Service: Urology;  Laterality: N/A;  Open  . Venous doppler  04/04/2011    No evidence of thrombus or thrombophlebitis  . Cardiac catheterization  12/23/2003    Recommended CABG; 100% LAD, 90% proximal circumflex followed by 80% OM1, 100% RCA. Right PDA and left DL in a codominant system.  . Coronary artery bypass graft  12/25/2003    x5, LIMA-LAD, SeqSVG-OM1-OM2, Seq SVG- RPDA-LPL  . Persantine myoview  10/22/2010    Normal perfusion  . 2d echocardiogram  11/02/2010; June 2017    a. EF 45-50%, w/ anterior hypokinesis, otherwise normal; mildly sclerotic aortic valve;; b.EF 60-65%. No regional wall motion abnormality. Grade 1 diastolic dysfunction. Mildly reduced RV function. Mild elevation of pulmonary pressures   . Ivc  11/12    placed prior to prostate surg    Prior to Admission medications   Medication Sig Start Date Taking? Authorizing Provider  Calcium Carbonate (CALTRATE 600 PO) Take 600 mg by  mouth 2 (two) times daily.   Yes Historical Provider, MD  Cholecalciferol (D3-1000 PO) Take 1,000 Units by mouth daily.   Yes Historical Provider, MD  clopidogrel (PLAVIX) 75 MG tablet Take 1 tablet (75 mg total) by mouth every other day. 07/23/13 Yes Leonie Man, MD  fish oil-omega-3 fatty acids 1000 MG capsule Take 1 g by mouth daily.  Yes Historical Provider, MD  losartan (COZAAR) 100 MG tablet TAKE ONE-HALF TABLET BY MOUTH DAILY. NEEDS APPOINTMENT FOR FUTURE REFILLS. 12/08/15 Yes Leonie Man, MD  metoprolol tartrate (LOPRESSOR) 25 MG tablet Take 12.5 mg by mouth 2 (two) times daily.   Yes Historical Provider, MD  Multiple Vitamins-Minerals (MULTIVITAMINS THER. W/MINERALS) TABS Take 1 tablet by mouth daily.   Yes Historical Provider, MD  simvastatin (ZOCOR) 40 MG tablet Take 20 mg by mouth at bedtime.   Yes Historical Provider, MD  tamsulosin (FLOMAX) 0.4 MG CAPS  capsule Take 0.4 mg by mouth daily after supper.  07/13/13 Yes Historical Provider, MD    Allergies  Allergen Reactions  . Food Anaphylaxis    NO KIWI FRUIT   . Aspirin Rash and Other (See Comments)    ANGIOEDEMA    Social History   Social History  . Marital Status: Married    Spouse Name: N/A  . Number of Children: N/A  . Years of Education: N/A   Social History Main Topics  . Smoking status: Former Smoker -- 1.00 packs/day for 20 years  . Smokeless tobacco: Never Used  . Alcohol Use: 1.0 - 1.5 oz/week    2-3 drink(s) per week     Comment: OCCAS  . Drug Use: No  . Sexual Activity: Not Asked   Other Topics Concern  . None   Social History Narrative   Pleasant, married father of 2, grandfather 16. Exercises on a daily basis walking at least 1 mile. Does not smoke - quit over 20 years ago. Drinks social alcoholic beverage.   Family History  Problem Relation Age of Onset  . Aneurysm Mother   . Cancer Father     Lung cancer  . Heart attack Brother   . Alzheimer's disease Brother      Wt Readings from Last 3 Encounters:  12/08/15 169 lb 6.4 oz (76.839 kg)  11/21/15 191 lb (86.637 kg)  10/27/15 174 lb 14.4 oz (79.334 kg)    PHYSICAL EXAM BP 117/70 mmHg  Pulse 54  Ht 5\' 7"  (1.702 m)  Wt 169 lb 6.4 oz (76.839 kg)  BMI 26.53 kg/m2 General appearance: alert, cooperative, appears stated age, no distress and Well-nourished, well-groomed answers questions appropriately. HEENT: [place]/AT, EOMI, MMM, anicteric sclera Neck: no adenopathy, no carotid bruit, no JVD and supple, symmetrical, trachea midline Lungs: clear to auscultation bilaterally, normal percussion bilaterally and Nonlabored, good air movement Heart: RRR, normal S1 and. Soft~2/6 SEM at RUSB--> carotids. No other M./R./G. Abdomen: soft, non-tender; bowel sounds normal; no masses, no organomegaly Extremities: extremities normal, atraumatic, no cyanosis or edema Pulses: 2+ and symmetric Neurologic: Grossly  normal    Adult ECG Report n/a  Other studies Reviewed: Additional studies/ records that were reviewed today include:  Recent Labs:   Lab Results  Component Value Date   CHOL 83 11/20/2015   HDL 31* 11/20/2015   LDLCALC 38 11/20/2015   TRIG 72 11/20/2015   CHOLHDL 2.7 11/20/2015    ASSESSMENT / PLAN: Problem List Items Addressed This Visit    S/P CABG x 5 (Chronic)    With  his lack of symptoms, I think we can extend our timeframe for considering a stress test. I think by next year, he'll be about 6 years out from his last test and we will reasonable to recheck from a screening affected. But with no symptoms currently I have no inclination to check a stress test now. This would not indicated preoperatively. In fact out of prefer not to have the issue of a questionable stress test result. I don't think that there is any reason to need to delay his surgery.      Preoperative cardiovascular examination    He does not have any active angina or heart failure symptoms. His planned surgery is a low risk surgery. He does not have diabetes, and has reasonable renal function.  PREOPERATIVE CARDIAC RISK ASSESSMENT   Revised Cardiac Risk Index:  High Risk Surgery: no;   Defined as Intraperitoneal, intrathoracic or suprainguinal vascular  Active CAD: no;  CHF: no;   Cerebrovascular Disease: no;   Diabetes: no; On Insulin: no  CKD (Cr >~ 2): no;   Total: 0 Estimated Risk of Adverse Outcome: LOW RISK patient for LOW RISK Surgery   Estimated Risk of MI, PE, VF/VT (Cardiac Arrest), Complete Heart Block: <0.9 %   ACC/AHA Guidelines for "Clearance":  Step 1 - Need for Emergency Surgery: No: but with recent UTI related sepsis & hematuria, would benefit from not waiting.  If Yes - go straight to OR with perioperative surveillance  Step 2 - Active Cardiac Conditions (Unstable Angina, Decompensated HF, Significant  Arrhytmias - Complete HB, Mobitz II, Symptomatic VT or SVT, Severe  Aortic Stenosis - mean gradient > 40 mmHg, Valve area < 1.0 cm2):   No: No active Sx.  If Yes - Evaluate & Treat per ACC/AHA Guidelines  Step 3 -  Low Risk Surgery: Yes  If Yes --> proceed to OR  If No --> Step 4  Step 4 - Functional Capacity >= 4 METS without symptoms: Yes  If Yes --> proceed to OR  If No --> Step 5  Step 5 --  Clinical Risk Factors (CRF)   No CRFs: Yes - see above RCRI  If Yes --> Proceed to OR  Based on my evaluation in the assessment above, would recommend proceeding to the OR without any further evaluation. Vision is relatively stable with no active symptoms. He can hold his Plavix for 5-7 days preoperatively pending surgeons preference. Restart when safe postop. No indication to do any cardiac stress tests at this point which would simply serve to delay surgery. I would not clinically consider ischemic evaluation or treatment in the absence of symptoms.       Essential hypertension (Chronic)    Well-controlled on current meds. No need for change.      Relevant Medications   losartan (COZAAR) 100 MG tablet   Other Relevant Orders   Myocardial Perfusion Imaging   Atherosclerosis of native coronary artery without angina pectoris - Primary (Chronic)    No recurrent symptoms to suggest any active angina. Most recent Myoview was negative for ischemia. He is on Plavix which can be held for surgery. He is on a beta blocker and ARB as well as statin. No further bleeding on Plavix. He is okay to stop Plavix for his surgery.      Relevant Medications   losartan (COZAAR) 100 MG tablet   Other Relevant Orders   Myocardial Perfusion Imaging   Aortic valve sclerosis (Chronic)    No signs of worsening  murmur. Unlikely to progress. Would not check unless murmur worsens.      Relevant Medications   losartan (COZAAR) 100 MG tablet   Other Relevant Orders   Myocardial Perfusion Imaging      Current medicines are reviewed at length with the patient today.  (+/- concerns) None The following changes have been made: None  Clearance for prostate surgery  May hold  plavix 5-7 days prior to surgery.   No other changes with current medications  Studies Ordered:   Orders Placed This Encounter  Procedures  . Myocardial Perfusion Imaging  - Study ordered for asked follow-up visit as part of routine surveillance.   ROV 1 yr - Myoview prior to f/u.     Glenetta Hew, M.D., M.S. Interventional Cardiologist   Pager # [phone] Phone # [phone] [address] [place], [place] [address]

## 2015-12-09 ENCOUNTER — Encounter: Payer: Self-pay | Admitting: Cardiology

## 2015-12-09 DIAGNOSIS — Z01810 Encounter for preprocedural cardiovascular examination: Secondary | ICD-10-CM | POA: Insufficient documentation

## 2015-12-09 NOTE — Assessment & Plan Note (Signed)
He does not have any active angina or heart failure symptoms. His planned surgery is a low risk surgery. He does not have diabetes, and has reasonable renal function.  PREOPERATIVE CARDIAC RISK ASSESSMENT   Revised Cardiac Risk Index:  High Risk Surgery: no;   Defined as Intraperitoneal, intrathoracic or suprainguinal vascular  Active CAD: no;  CHF: no;   Cerebrovascular Disease: no;   Diabetes: no; On Insulin: no  CKD (Cr >~ 2): no;   Total: 0 Estimated Risk of Adverse Outcome: LOW RISK patient for LOW RISK Surgery   Estimated Risk of MI, PE, VF/VT (Cardiac Arrest), Complete Heart Block: <0.9 %   ACC/AHA Guidelines for "Clearance":  Step 1 - Need for Emergency Surgery: No: but with recent UTI related sepsis & hematuria, would benefit from not waiting.  If Yes - go straight to OR with perioperative surveillance  Step 2 - Active Cardiac Conditions (Unstable Angina, Decompensated HF, Significant  Arrhytmias - Complete HB, Mobitz II, Symptomatic VT or SVT, Severe Aortic Stenosis - mean gradient > 40 mmHg, Valve area < 1.0 cm2):   No: No active Sx.  If Yes - Evaluate & Treat per ACC/AHA Guidelines  Step 3 -  Low Risk Surgery: Yes  If Yes --> proceed to OR  If No --> Step 4  Step 4 - Functional Capacity >= 4 METS without symptoms: Yes  If Yes --> proceed to OR  If No --> Step 5  Step 5 --  Clinical Risk Factors (CRF)   No CRFs: Yes - see above RCRI  If Yes --> Proceed to OR  Based on my evaluation in the assessment above, would recommend proceeding to the OR without any further evaluation. Vision is relatively stable with no active symptoms. He can hold his Plavix for 5-7 days preoperatively pending surgeons preference. Restart when safe postop. No indication to do any cardiac stress tests at this point which would simply serve to delay surgery. I would not clinically consider ischemic evaluation or treatment in the absence of symptoms.

## 2015-12-09 NOTE — Assessment & Plan Note (Signed)
Well-controlled on current meds. No need for change.

## 2015-12-09 NOTE — Assessment & Plan Note (Signed)
With his lack of symptoms, I think we can extend our timeframe for considering a stress test. I think by next year, he'll be about 6 years out from his last test and we will reasonable to recheck from a screening affected. But with no symptoms currently I have no inclination to check a stress test now. This would not indicated preoperatively. In fact out of prefer not to have the issue of a questionable stress test result. I don't think that there is any reason to need to delay his surgery.

## 2015-12-09 NOTE — Assessment & Plan Note (Signed)
No recurrent symptoms to suggest any active angina. Most recent Myoview was negative for ischemia. He is on Plavix which can be held for surgery. He is on a beta blocker and ARB as well as statin. No further bleeding on Plavix. He is okay to stop Plavix for his surgery.

## 2015-12-09 NOTE — Assessment & Plan Note (Addendum)
No signs of worsening murmur. Unlikely to progress. Would not check unless murmur worsens.

## 2015-12-10 ENCOUNTER — Other Ambulatory Visit: Payer: Self-pay | Admitting: Urology

## 2015-12-12 ENCOUNTER — Emergency Department (HOSPITAL_COMMUNITY)
Admission: EM | Admit: 2015-12-12 | Discharge: 2015-12-12 | Disposition: A | Discharge status: Home / Self Care | Payer: Medicare Other | Attending: Emergency Medicine | Admitting: Emergency Medicine

## 2015-12-12 ENCOUNTER — Encounter (HOSPITAL_COMMUNITY): Payer: Self-pay | Admitting: Emergency Medicine

## 2015-12-12 DIAGNOSIS — I10 Essential (primary) hypertension: Secondary | ICD-10-CM | POA: Insufficient documentation

## 2015-12-12 DIAGNOSIS — T83038A Leakage of other indwelling urethral catheter, initial encounter: Secondary | ICD-10-CM | POA: Diagnosis not present

## 2015-12-12 DIAGNOSIS — I2510 Atherosclerotic heart disease of native coronary artery without angina pectoris: Secondary | ICD-10-CM | POA: Insufficient documentation

## 2015-12-12 DIAGNOSIS — T83098A Other mechanical complication of other indwelling urethral catheter, initial encounter: Secondary | ICD-10-CM | POA: Insufficient documentation

## 2015-12-12 DIAGNOSIS — Z87891 Personal history of nicotine dependence: Secondary | ICD-10-CM | POA: Diagnosis not present

## 2015-12-12 DIAGNOSIS — Y828 Other medical devices associated with adverse incidents: Secondary | ICD-10-CM | POA: Diagnosis not present

## 2015-12-12 DIAGNOSIS — T839XXA Unspecified complication of genitourinary prosthetic device, implant and graft, initial encounter: Secondary | ICD-10-CM

## 2015-12-12 LAB — URINALYSIS, ROUTINE W REFLEX MICROSCOPIC
Bilirubin Urine: NEGATIVE
GLUCOSE, UA: NEGATIVE mg/dL
Ketones, ur: NEGATIVE mg/dL
Nitrite: POSITIVE — AB
Protein, ur: NEGATIVE mg/dL
SPECIFIC GRAVITY, URINE: 1.009 (ref 1.005–1.030)
pH: 6.5 (ref 5.0–8.0)

## 2015-12-12 LAB — I-STAT CHEM 8, ED
BUN: 17 mg/dL (ref 6–20)
CREATININE: 0.9 mg/dL (ref 0.61–1.24)
Calcium, Ion: 1.2 mmol/L (ref 1.13–1.30)
Chloride: 103 mmol/L (ref 101–111)
Glucose, Bld: 113 mg/dL — ABNORMAL HIGH (ref 65–99)
HEMATOCRIT: 42 % (ref 39.0–52.0)
HEMOGLOBIN: 14.3 g/dL (ref 13.0–17.0)
Potassium: 3.9 mmol/L (ref 3.5–5.1)
Sodium: 140 mmol/L (ref 135–145)
TCO2: 25 mmol/L (ref 0–100)

## 2015-12-12 LAB — URINE MICROSCOPIC-ADD ON

## 2015-12-12 MED ORDER — CEPHALEXIN 500 MG PO CAPS: 500.0000 mg | ORAL_CAPSULE | Freq: Two times a day (BID) | ORAL | Status: DC

## 2015-12-12 NOTE — ED Notes (Addendum)
Irrigated foley without any resistance.  Patient could feel the fluids going into foley.

## 2015-12-12 NOTE — ED Notes (Signed)
Pt comes to ed, pt reports his folley cath was not draining 6pm last night. Pt reports no pain, no troubles but wants to be able to pee.

## 2015-12-12 NOTE — ED Provider Notes (Signed)
CSN: [id number]     Arrival date & time 12/12/15  [id number] History   First MD Initiated Contact with Patient 12/12/15 0739     No chief complaint on file.    (Consider location/radiation/quality/duration/timing/severity/associated sxs/prior Treatment) HPI Comments: Patient presents with dizziness urine output. He was recently admitted at the beginning of June for UTI sepsis. He wasn't able to urinate on discharge and was discharged with a Foley catheter in place. It became clogged and he followed up with [facility] where he had the catheter replaced about a week ago. He states it's been doing fine until yesterday. He states that during the night last night he feels like it didn't drain as much as it normally does. He denies abdominal pain. No nausea or vomiting. No fevers. No change in the color of his urine. No gross hematuria.   Past Medical History  Diagnosis Date  . History of AAA (abdominal aortic aneurysm) repair 2006    AAA R&G Hemasheild 14 mm x 10 mm (Dr. Donnetta Hutching);; CT scan in 2013 shows 4.8 cm thoracic aortic aneurysm - the followed by Dr. Donnetta Hutching  . History of blood clots     right lower extremity dvt  after inguinal hernia surgery may 2012--doppler follow up report 04/07/11 at [facility] heart and vaxcular shows resolution of the dvt--but pt is to have IVC filter placed 05/05/11--prior to planned prostate surgery scheduled for 05/09/11.  . Coronary artery disease involving native coronary artery 2005    100% LAD, 90% proximal circumflex followed by 80% OM1, 100% RCA. Right PDA and left DL in a codominant system --> CABG '05. low risk Nuc 5/12  . S/P CABG x 5 2005     LIMA-LAD, SeqSVG-OM1-OM2, Seq SVG- RPDA-LPL  . Essential hypertension   . Renal cyst may 2012    bleeding from right renal cyst /acute renal failure secondary to urinary retention from bph --pt states  the cyst is no longer a problem--pt still has indwelling foley catheter--plans  open  suprapubic prostatectomy on  11/19 with dr. Gaynelle Arabian  . BPH (benign prostatic hypertrophy) with urinary obstruction 11/12    surg  . History of hematuria   . Dyslipidemia   . H/O transfusion of packed red blood cells MAY 2012  . History of DVT of lower extremity     right lower extremity dvt  after inguinal hernia surgery may 2012--doppler follow up report 04/07/11 at [facility] heart and vaxcular shows resolution of the dvt--but pt is to have IVC filter placed 05/05/11--prior to planned prostate surgery scheduled for 05/09/11.  . S/P insertion of IVC (inferior vena caval) filter 05/05/11    prior to prostate surgery   Past Surgical History  Procedure Laterality Date  . Aaa rpair  03/29/2005    14x75mm Hemashield graft  . Hernia repair Left     MAY 2012  . Prostatectomy  05/09/2011    Procedure: PROSTATECTOMY SUPRAPUBIC;  Surgeon: Ailene Rud, MD;  Location: [facility];  Service: Urology;  Laterality: N/A;  Open  . Venous doppler  04/04/2011    No evidence of thrombus or thrombophlebitis  . Cardiac catheterization  12/23/2003    Recommended CABG; 100% LAD, 90% proximal circumflex followed by 80% OM1, 100% RCA. Right PDA and left DL in a codominant system.  . Coronary artery bypass graft  12/25/2003    x5, LIMA-LAD, SeqSVG-OM1-OM2, Seq SVG- RPDA-LPL  . Persantine myoview  10/22/2010    Normal perfusion  . 2d echocardiogram  11/02/2010; June 2017  a. EF 45-50%, w/ anterior hypokinesis, otherwise normal; mildly sclerotic aortic valve;; b.EF 60-65%. No regional wall motion abnormality. Grade 1 diastolic dysfunction. Mildly reduced RV function. Mild elevation of pulmonary pressures   . Ivc  11/12    placed prior to prostate surg   Family History  Problem Relation Age of Onset  . Aneurysm Mother   . Cancer Father     Lung cancer  . Heart attack Brother   . Alzheimer's disease Brother    Social History  Substance Use Topics  . Smoking status: Former Smoker -- 1.00 packs/day for 20 years  . Smokeless  tobacco: Never Used  . Alcohol Use: 1.0 - 1.5 oz/week    2-3 drink(s) per week     Comment: OCCAS    Review of Systems  Constitutional: Negative for fever, chills, diaphoresis and fatigue.  HENT: Negative for congestion, rhinorrhea and sneezing.   Eyes: Negative.   Respiratory: Negative for cough, chest tightness and shortness of breath.   Cardiovascular: Negative for chest pain and leg swelling.  Gastrointestinal: Negative for nausea, vomiting, abdominal pain, diarrhea and blood in stool.  Genitourinary: Positive for dysuria and decreased urine volume. Negative for frequency, hematuria, flank pain and difficulty urinating.  Musculoskeletal: Negative for back pain and arthralgias.  Skin: Negative for rash.  Neurological: Negative for dizziness, speech difficulty, weakness, numbness and headaches.      Allergies  Food and Aspirin  Home Medications   Prior to Admission medications   Medication Sig Start Date End Date Taking? Authorizing Provider  Calcium Carbonate (CALTRATE 600 PO) Take 600 mg by mouth 2 (two) times daily.     Historical Provider, MD  cephALEXin (KEFLEX) 500 MG capsule Take 1 capsule (500 mg total) by mouth 2 (two) times daily. 12/12/15   Malvin Johns, MD  Cholecalciferol (D3-1000 PO) Take 1,000 Units by mouth daily.     Historical Provider, MD  clopidogrel (PLAVIX) 75 MG tablet Take 1 tablet (75 mg total) by mouth every other day. 07/23/13   Leonie Man, MD  fish oil-omega-3 fatty acids 1000 MG capsule Take 1 g by mouth daily.    Historical Provider, MD  losartan (COZAAR) 100 MG tablet TAKE ONE-HALF TABLET BY MOUTH DAILY. [name] Patient taking differently: Take 50 mg by mouth daily.  12/08/15   Leonie Man, MD  metoprolol tartrate (LOPRESSOR) 25 MG tablet Take 12.5 mg by mouth 2 (two) times daily.     Historical Provider, MD  Multiple Vitamins-Minerals (MULTIVITAMINS THER. W/MINERALS) TABS Take 1 tablet by mouth daily.     Historical Provider, MD  simvastatin  (ZOCOR) 40 MG tablet Take 20 mg by mouth at bedtime.     Historical Provider, MD  tamsulosin (FLOMAX) 0.4 MG CAPS capsule Take 0.4 mg by mouth daily after supper.  07/13/13   Historical Provider, MD   BP 140/73 mmHg  Pulse 58  Temp(Src) 98 F (36.7 C) (Oral)  Resp 16  SpO2 97% Physical Exam  Constitutional: He is oriented to person, place, and time. He appears well-developed and well-nourished.  HENT:  Head: Normocephalic and atraumatic.  Eyes: Pupils are equal, round, and reactive to light.  Neck: Normal range of motion. Neck supple.  Cardiovascular: Normal rate, regular rhythm and normal heart sounds.   Pulmonary/Chest: Effort normal and breath sounds normal. No respiratory distress. He has no wheezes. He has no rales. He exhibits no tenderness.  Abdominal: Soft. Bowel sounds are normal. There is no tenderness. There is no rebound  and no guarding.  Genitourinary:  Foley catheter is in place with a leg bag. There is about 100 cc of dark urine in the bag. There is no drainage around the catheter.  Musculoskeletal: Normal range of motion. He exhibits no edema.  Lymphadenopathy:    He has no cervical adenopathy.  Neurological: He is alert and oriented to person, place, and time.  Skin: Skin is warm and dry. No rash noted.  Psychiatric: He has a normal mood and affect.    ED Course  Procedures (including critical care time) Labs Review Labs Reviewed  URINALYSIS, ROUTINE W REFLEX MICROSCOPIC (NOT AT [facility]) - Abnormal; Notable for the following:    APPearance CLOUDY (*)    Hgb urine dipstick MODERATE (*)    Nitrite POSITIVE (*)    Leukocytes, UA MODERATE (*)    All other components within normal limits  URINE MICROSCOPIC-ADD ON - Abnormal; Notable for the following:    Squamous Epithelial / LPF 0-5 (*)    Bacteria, UA MANY (*)    Crystals CA OXALATE CRYSTALS (*)    All other components within normal limits  I-STAT CHEM 8, ED - Abnormal; Notable for the following:    Glucose, Bld  113 (*)    All other components within normal limits  URINE CULTURE    Imaging Review No results found. I have personally reviewed and evaluated these images and lab results as part of my medical decision-making.   EKG Interpretation None      MDM   Final diagnoses:  Foley catheter problem, initial encounter ([place])    Patient's Foley was irrigated and following this a restraining without problem. His creatinine is normal. His urine looks infected. It's hard to tell if is related to the catheter placement however since he has a catheter in place with recent obstruction, and will place him on antibiotics. He was started on Keflex. He was encouraged to follow-up with his urologist. Return precautions were given. I reviewed his chart. His last urine culture did not grow out anything.    Malvin Johns, MD 12/12/15 1029

## 2015-12-12 NOTE — ED Notes (Signed)
Bladder scan was completed by previous nurse and patient had no urine in his bladder.

## 2015-12-14 LAB — URINE CULTURE: Special Requests: NORMAL

## 2015-12-15 ENCOUNTER — Telehealth (HOSPITAL_BASED_OUTPATIENT_CLINIC_OR_DEPARTMENT_OTHER): Payer: Self-pay | Admitting: Emergency Medicine

## 2015-12-15 ENCOUNTER — Telehealth: Payer: Self-pay | Admitting: Emergency Medicine

## 2015-12-15 NOTE — Progress Notes (Signed)
ED Antimicrobial Stewardship Positive Culture Follow Up   Charles SaxCharles T Terry is an 82 y.o. male who presented to [facility] on 12/12/2015 with a chief complaint of No chief complaint on file.   Recent Results (from the past 720 hour(s))  Blood Culture (routine x 2)     Status: None   Collection Time: 11/19/15  8:12 PM  Result Value Ref Range Status   Specimen Description BLOOD LEFT ANTECUBITAL  Final   Special Requests BOTTLES DRAWN AEROBIC AND ANAEROBIC 10CC  Final   Culture NO GROWTH 5 DAYS  Final   Report Status 11/24/2015 FINAL  Final  Blood Culture (routine x 2)     Status: Abnormal   Collection Time: 11/19/15  8:12 PM  Result Value Ref Range Status   Specimen Description BLOOD RIGHT FOREARM  Final   Special Requests BOTTLES DRAWN AEROBIC AND ANAEROBIC 5CC  Final   Culture  Setup Time   Final    GRAM NEGATIVE RODS AEROBIC BOTTLE ONLY CRITICAL RESULT CALLED TO, READ BACK BY AND VERIFIED WITH: L BAJBUS PHARMD 1927 11/20/15 A BROWNING    Culture PSEUDOMONAS AERUGINOSA (A)  Final   Report Status 11/22/2015 FINAL  Final   Organism ID, Bacteria PSEUDOMONAS AERUGINOSA  Final      Susceptibility   Pseudomonas aeruginosa - MIC*    CEFTAZIDIME 4 SENSITIVE Sensitive     CIPROFLOXACIN <=0.25 SENSITIVE Sensitive     GENTAMICIN <=1 SENSITIVE Sensitive     IMIPENEM 2 SENSITIVE Sensitive     PIP/TAZO 8 SENSITIVE Sensitive     CEFEPIME 2 SENSITIVE Sensitive     * PSEUDOMONAS AERUGINOSA  Blood Culture ID Panel (Reflexed)     Status: Abnormal   Collection Time: 11/19/15  8:12 PM  Result Value Ref Range Status   Enterococcus species NOT DETECTED NOT DETECTED Final   Vancomycin resistance NOT DETECTED NOT DETECTED Final   Listeria monocytogenes NOT DETECTED NOT DETECTED Final   Staphylococcus species NOT DETECTED NOT DETECTED Final   Staphylococcus aureus NOT DETECTED NOT DETECTED Final   Methicillin resistance NOT DETECTED NOT DETECTED Final   Streptococcus species NOT DETECTED NOT DETECTED  Final   Streptococcus agalactiae NOT DETECTED NOT DETECTED Final   Streptococcus pneumoniae NOT DETECTED NOT DETECTED Final   Streptococcus pyogenes NOT DETECTED NOT DETECTED Final   Acinetobacter baumannii NOT DETECTED NOT DETECTED Final   Enterobacteriaceae species NOT DETECTED NOT DETECTED Final   Enterobacter cloacae complex NOT DETECTED NOT DETECTED Final   Escherichia coli NOT DETECTED NOT DETECTED Final   Klebsiella oxytoca NOT DETECTED NOT DETECTED Final   Klebsiella pneumoniae NOT DETECTED NOT DETECTED Final   Proteus species NOT DETECTED NOT DETECTED Final   Serratia marcescens NOT DETECTED NOT DETECTED Final   Carbapenem resistance NOT DETECTED NOT DETECTED Final   Haemophilus influenzae NOT DETECTED NOT DETECTED Final   Neisseria meningitidis NOT DETECTED NOT DETECTED Final   Pseudomonas aeruginosa DETECTED (A) NOT DETECTED Final    Comment: CRITICAL RESULT CALLED TO, READ BACK BY AND VERIFIED WITH: L BAJBUS PHARMD 1927 11/20/15 A BROWNING    Candida albicans NOT DETECTED NOT DETECTED Final   Candida glabrata NOT DETECTED NOT DETECTED Final   Candida krusei NOT DETECTED NOT DETECTED Final   Candida parapsilosis NOT DETECTED NOT DETECTED Final   Candida tropicalis NOT DETECTED NOT DETECTED Final  Urine culture     Status: None   Collection Time: 11/19/15  8:30 PM  Result Value Ref Range Status   Specimen  Description URINE, CATHETERIZED  Final   Special Requests NONE  Final   Culture NO GROWTH  Final   Report Status 11/21/2015 FINAL  Final  MRSA PCR Screening     Status: None   Collection Time: 11/20/15 12:39 AM  Result Value Ref Range Status   MRSA by PCR NEGATIVE NEGATIVE Final    Comment:        The GeneXpert MRSA Assay (FDA approved for NASAL specimens only), is one component of a comprehensive MRSA colonization surveillance program. It is not intended to diagnose MRSA infection nor to guide or monitor treatment for MRSA infections.   Culture, blood (Routine  X 2) w Reflex to ID Panel     Status: None   Collection Time: 11/21/15 11:28 AM  Result Value Ref Range Status   Specimen Description BLOOD LEFT HAND  Final   Special Requests BOTTLES DRAWN AEROBIC AND ANAEROBIC 5CC  Final   Culture NO GROWTH 5 DAYS  Final   Report Status 11/26/2015 FINAL  Final  Culture, blood (Routine X 2) w Reflex to ID Panel     Status: None   Collection Time: 11/21/15 11:31 AM  Result Value Ref Range Status   Specimen Description BLOOD RIGHT HAND  Final   Special Requests BOTTLES DRAWN AEROBIC AND ANAEROBIC 5CC  Final   Culture NO GROWTH 5 DAYS  Final   Report Status 11/26/2015 FINAL  Final  Urine culture     Status: Abnormal   Collection Time: 12/12/15  8:47 AM  Result Value Ref Range Status   Specimen Description URINE, CATHETERIZED  Final   Special Requests Normal  Final   Culture (A)  Final    >=100,000 COLONIES/mL STAPHYLOCOCCUS SPECIES (COAGULASE NEGATIVE)   Report Status 12/14/2015 FINAL  Final   Organism ID, Bacteria STAPHYLOCOCCUS SPECIES (COAGULASE NEGATIVE) (A)  Final      Susceptibility   Staphylococcus species (coagulase negative) - MIC*    CIPROFLOXACIN >=8 RESISTANT Resistant     GENTAMICIN <=0.5 SENSITIVE Sensitive     NITROFURANTOIN <=16 SENSITIVE Sensitive     OXACILLIN >=4 RESISTANT Resistant     TETRACYCLINE >=16 RESISTANT Resistant     VANCOMYCIN 2 SENSITIVE Sensitive     TRIMETH/SULFA 160 RESISTANT Resistant     CLINDAMYCIN <=0.25 SENSITIVE Sensitive     RIFAMPIN <=0.5 SENSITIVE Sensitive     Inducible Clindamycin NEGATIVE Sensitive     * >=100,000 COLONIES/mL STAPHYLOCOCCUS SPECIES (COAGULASE NEGATIVE)    [x]  Treated with cephalexin, organism resistant to prescribed antimicrobial  New antibiotic prescription: Stop cephalexin. Take fosfomycin 3 gm PO q 3 days x 3 doses.  ED Provider: Lenn Sink, PA-C  Joie Reamer L. Nicole Kindred, PharmD PGY2 Infectious Diseases Pharmacy Resident Pager: [phone] 12/15/2015 10:33 AM

## 2015-12-15 NOTE — Telephone Encounter (Signed)
Post ED Visit - Positive Culture Follow-up: Successful Patient Follow-Up  Culture assessed and recommendations reviewed by: []  Elenor Quinones, Pharm.D. []  Heide Guile, Pharm.D., BCPS []  Parks Neptune, Pharm.D. []  Alycia Rossetti, Pharm.D., BCPS []  [place], [place].D., BCPS, AAHIVP []  Legrand Como, Pharm.D., BCPS, AAHIVP [x]  Milus Glazier, Pharm.D. []  Stephens November, Pharm.D.  Positive urine culture  []  Patient discharged without antimicrobial prescription and treatment is now indicated [x]  Organism is resistant to prescribed ED discharge antimicrobial []  Patient with positive blood cultures  Changes discussed with ED provider: Okey Regal PA New antibiotic prescription Stop Fosfomycin 3 gram po q 3 days x 3 doses  Attempting to contact patient   Hazle Nordmann 12/15/2015, 3:02 PM

## 2015-12-16 ENCOUNTER — Telehealth (HOSPITAL_BASED_OUTPATIENT_CLINIC_OR_DEPARTMENT_OTHER): Payer: Self-pay | Admitting: Emergency Medicine

## 2015-12-16 ENCOUNTER — Ambulatory Visit: Payer: Medicare Other | Admitting: Physician Assistant

## 2015-12-18 ENCOUNTER — Encounter (HOSPITAL_COMMUNITY)
Admission: RE | Admit: 2015-12-18 | Discharge: 2015-12-18 | Disposition: A | Discharge status: Home / Self Care | Payer: Medicare Other | Source: Ambulatory Visit | Attending: Urology | Admitting: Urology

## 2015-12-18 ENCOUNTER — Telehealth (HOSPITAL_BASED_OUTPATIENT_CLINIC_OR_DEPARTMENT_OTHER): Payer: Self-pay

## 2015-12-18 ENCOUNTER — Encounter (HOSPITAL_COMMUNITY): Payer: Self-pay

## 2015-12-18 DIAGNOSIS — Z01812 Encounter for preprocedural laboratory examination: Secondary | ICD-10-CM | POA: Insufficient documentation

## 2015-12-18 DIAGNOSIS — N400 Enlarged prostate without lower urinary tract symptoms: Secondary | ICD-10-CM | POA: Diagnosis not present

## 2015-12-18 LAB — BASIC METABOLIC PANEL
ANION GAP: 6 (ref 5–15)
BUN: 17 mg/dL (ref 6–20)
CO2: 28 mmol/L (ref 22–32)
Calcium: 9.6 mg/dL (ref 8.9–10.3)
Chloride: 103 mmol/L (ref 101–111)
Creatinine, Ser: 1.06 mg/dL (ref 0.61–1.24)
GFR calc Af Amer: >60 mL/min (ref >60)
Glucose, Bld: 120 mg/dL — ABNORMAL HIGH (ref 65–99)
POTASSIUM: 4.7 mmol/L (ref 3.5–5.1)
SODIUM: 137 mmol/L (ref 135–145)

## 2015-12-18 LAB — CBC
HEMATOCRIT: 42.7 % (ref 39.0–52.0)
HEMOGLOBIN: 14.2 g/dL (ref 13.0–17.0)
MCH: 31.3 pg (ref 26.0–34.0)
MCHC: 33.3 g/dL (ref 30.0–36.0)
MCV: 94.3 fL (ref 78.0–100.0)
Platelets: 155 10*3/uL (ref 150–400)
RBC: 4.53 MIL/uL (ref 4.22–5.81)
RDW: 14.4 % (ref 11.5–15.5)
WBC: 7.8 10*3/uL (ref 4.0–10.5)

## 2015-12-18 NOTE — ED Provider Notes (Signed)
Patient needed a prior authorization for Fosfomycin which was prescribed after culture review on 6/28. I reviewedc C&S reports with Dr. Hazle Coca. Patient abx changed to clindamycin.   Margarita Mail, PA-C 12/18/15 Oliver, MD 01/16/16 1352

## 2015-12-18 NOTE — ED Notes (Signed)
Pt and his wife came to the ED and reported that their insurance [employer] told them that they are not going to cover the medicine prescribed for his UTI unless he really needs to medication.  Contacted [employer] to verify confirm that pt does need the med.  Abigail H. EDPA gave a verbal order to change abx rx.  Called it in at pt's pharmacy.  Pt contacted and updated.

## 2015-12-18 NOTE — Pre-Procedure Instructions (Addendum)
EKG 11-19-15 epic 2V CXR 11-19-15 epic 1V CXR 11-21-15 epic Echo 11-23-15 epic  LOV Cardiology and Cardiac Clearance 12-08-15 epic

## 2015-12-18 NOTE — Patient Instructions (Signed)
Charles Terry  12/18/2015   Your procedure is scheduled on: 12/24/15  Report to [facility] Main  Entrance take [name]  elevators to 3rd floor to  [name] at 5:30AM.  Call this number if you have problems the morning of surgery [phone]   Remember: ONLY 1 PERSON MAY GO WITH YOU TO SHORT STAY TO GET  READY MORNING OF [name].  Do not eat food or drink liquids :After Midnight.     Take these medicines the morning of surgery with A SIP OF WATER: Caphalexin (Keflex), Metoprolol (Lopressor)                                You may not have any metal on your body including hair pins and              piercings  Do not wear jewelry, make-up, lotions, powders or perfumes, deodorant             Do not wear nail polish.  Do not shave  48 hours prior to surgery.              Men may shave face and neck.   Do not bring valuables to the hospital. [name].  Contacts, dentures or bridgework may not be worn into surgery.  Leave suitcase in the car. After surgery it may be brought to your room.               Please read over the following fact sheets you were given: _____________________________________________________________________             [facility] - Preparing for Surgery Before surgery, you can play an important role.  Because skin is not sterile, your skin needs to be as free of germs as possible.  You can reduce the number of germs on your skin by washing with CHG (chlorahexidine gluconate) soap before surgery.  CHG is an antiseptic cleaner which kills germs and bonds with the skin to continue killing germs even after washing. Please DO NOT use if you have an allergy to CHG or antibacterial soaps.  If your skin becomes reddened/irritated stop using the CHG and inform your nurse when you arrive at Short Stay. Do not shave (including legs and underarms) for at least 48 hours prior to the first CHG  shower.  You may shave your face/neck. Please follow these instructions carefully:  1.  Shower with CHG Soap the night before surgery and the  morning of Surgery.  2.  If you choose to wash your hair, wash your hair first as usual with your  normal  shampoo.  3.  After you shampoo, rinse your hair and body thoroughly to remove the  shampoo.                           4.  Use CHG as you would any other liquid soap.  You can apply chg directly  to the skin and wash                       Gently with a scrungie or clean washcloth.  5.  Apply the CHG Soap to  your body ONLY FROM THE NECK DOWN.   Do not use on face/ open                           Wound or open sores. Avoid contact with eyes, ears mouth and genitals (private parts).                       Wash face,  Genitals (private parts) with your normal soap.             6.  Wash thoroughly, paying special attention to the area where your surgery  will be performed.  7.  Thoroughly rinse your body with warm water from the neck down.  8.  DO NOT shower/wash with your normal soap after using and rinsing off  the CHG Soap.                9.  Pat yourself dry with a clean towel.            10.  Wear clean pajamas.            11.  Place clean sheets on your bed the night of your first shower and do not  sleep with pets. Day of Surgery : Do not apply any lotions/deodorants the morning of surgery.  Please wear clean clothes to the hospital/surgery center.  FAILURE TO FOLLOW THESE INSTRUCTIONS MAY RESULT IN THE CANCELLATION OF YOUR SURGERY PATIENT SIGNATURE_________________________________  NURSE SIGNATURE__________________________________  ________________________________________________________________________  WHAT IS A BLOOD TRANSFUSION? Blood Transfusion Information  A transfusion is the replacement of blood or some of its parts. Blood is made up of multiple cells which provide different functions.  Red blood cells carry oxygen and are used for  blood loss replacement.  White blood cells fight against infection.  Platelets control bleeding.  Plasma helps clot blood.  Other blood products are available for specialized needs, such as hemophilia or other clotting disorders. BEFORE THE TRANSFUSION  Who gives blood for transfusions?   Healthy volunteers who are fully evaluated to make sure their blood is safe. This is blood bank blood. Transfusion therapy is the safest it has ever been in the practice of medicine. Before blood is taken from a donor, a complete history is taken to make sure that person has no history of diseases nor engages in risky social behavior (examples are intravenous drug use or sexual activity with multiple partners). The donor's travel history is screened to minimize risk of transmitting infections, such as malaria. The donated blood is tested for signs of infectious diseases, such as HIV and hepatitis. The blood is then tested to be sure it is compatible with you in order to minimize the chance of a transfusion reaction. If you or a relative donates blood, this is often done in anticipation of surgery and is not appropriate for emergency situations. It takes many days to process the donated blood. RISKS AND COMPLICATIONS Although transfusion therapy is very safe and saves many lives, the main dangers of transfusion include:   Getting an infectious disease.  Developing a transfusion reaction. This is an allergic reaction to something in the blood you were given. Every precaution is taken to prevent this. The decision to have a blood transfusion has been considered carefully by your caregiver before blood is given. Blood is not given unless the benefits outweigh the risks. AFTER THE TRANSFUSION  Right after receiving a blood transfusion, you will usually  feel much better and more energetic. This is especially true if your red blood cells have gotten low (anemic). The transfusion raises the level of the red blood  cells which carry oxygen, and this usually causes an energy increase.  The nurse administering the transfusion will monitor you carefully for complications. HOME CARE INSTRUCTIONS  No special instructions are needed after a transfusion. You may find your energy is better. Speak with your caregiver about any limitations on activity for underlying diseases you may have. SEEK MEDICAL CARE IF:   Your condition is not improving after your transfusion.  You develop redness or irritation at the intravenous (IV) site. SEEK IMMEDIATE MEDICAL CARE IF:  Any of the following symptoms occur over the next 12 hours:  Shaking chills.  You have a temperature by mouth above 102 F (38.9 C), not controlled by medicine.  Chest, back, or muscle pain.  People around you feel you are not acting correctly or are confused.  Shortness of breath or difficulty breathing.  Dizziness and fainting.  You get a rash or develop hives.  You have a decrease in urine output.  Your urine turns a dark color or changes to pink, red, or brown. Any of the following symptoms occur over the next 10 days:  You have a temperature by mouth above 102 F (38.9 C), not controlled by medicine.  Shortness of breath.  Weakness after normal activity.  The white part of the eye turns yellow (jaundice).  You have a decrease in the amount of urine or are urinating less often.  Your urine turns a dark color or changes to pink, red, or brown. Document Released: 06/03/2000 Document Revised: 08/29/2011 Document Reviewed: 01/21/2008 [facility] Patient Information 2014 [place], [place].  _______________________________________________________________________

## 2015-12-23 ENCOUNTER — Other Ambulatory Visit: Payer: Self-pay | Admitting: Urology

## 2015-12-23 NOTE — Anesthesia Preprocedure Evaluation (Addendum)
Anesthesia Evaluation  Patient identified by MRN, date of birth, ID band Patient awake    Reviewed: Allergy & Precautions, H&P , NPO status , Patient's Chart, lab work & pertinent test results, reviewed documented beta blocker date and time   Airway Mallampati: II  TM Distance: >3 FB Neck ROM: Full    Dental  (+) Teeth Intact, Caps, Dental Advisory Given   Pulmonary former smoker,    Pulmonary exam normal        Cardiovascular hypertension, Pt. on medications and Pt. on home beta blockers + CAD, + CABG and + Peripheral Vascular Disease  Normal cardiovascular exam  Echo 11/23/2015 Study Conclusions  - Left ventricle: The cavity size was normal. There was mild concentric hypertrophy. Systolic function was normal. The estimated ejection fraction was in the range of 60% to 65%. Wall motion was normal; there were no regional wall motion abnormalities. Doppler parameters are consistent with abnormal left ventricular relaxation (grade 1 diastolic dysfunction). There was no evidence of elevated ventricular filling pressure by Doppler parameters. - Aortic valve: Valve mobility was restricted. There was mild regurgitation.    Neuro/Psych negative neurological ROS  negative psych ROS   GI/Hepatic negative GI ROS, Neg liver ROS,   Endo/Other  negative endocrine ROS  Renal/GU      Musculoskeletal   Abdominal   Peds  Hematology   Anesthesia Other Findings   Reproductive/Obstetrics                           Anesthesia Physical  Anesthesia Plan  ASA: III  Anesthesia Plan: General   Post-op Pain Management:    Induction: Intravenous  Airway Management Planned: LMA  Additional Equipment:   Intra-op Plan:   Post-operative Plan: Extubation in OR  Informed Consent: I have reviewed the patients History and Physical, chart, labs and discussed the procedure including the risks, benefits and  alternatives for the proposed anesthesia with the patient or authorized representative who has indicated his/her understanding and acceptance.   Consent reviewed with POA  Plan Discussed with: CRNA and Anesthesiologist  Anesthesia Plan Comments:        Anesthesia Quick Evaluation

## 2015-12-23 NOTE — H&P (Signed)
Reason For Visit Cystoscopy  Active Problems Problems  1. Gross hematuria (R31.0)  Assessed By: Carolan Clines (Urology); Last Assessed: 07 Oct 2015 History of Present Illness 82 YO male returns today for a cystoscopy for hx of 2 episodes of gross hematuria back before Thanksgiving. He does take Plavix. Patient states that he is voiding well with good control. He continues to take Tamsulosin. He is s/p open prostatectomy 05/09/11. Pathology: benign prostate tissue. No evidence of malignancy. 09/03/10 PSA - 5.88/17% He is a personal friend of Charles Terry. Hx of chronic urinary retention. He is post-op [facility] Nov 12, 2010, followed by RLE phlebitis, treated with anticoagulation. However, he developed gross hematuria along with his chronic urinary retention, and CT showed massive R renal hemorrhagic cyst. He was considered for caval stent, but the cyst was pushing in on the IVC, and he was considered too high a risk for the procedure. He has had chronic foley cath, despite years of finasteride, and more recent tamsulosin. He has failed multiple voiding trials. He has recently had videourodynamics which show 0 flow, with 35cm H20 bladder pressure. Note recent urine c/s showing Acinetobacter and Enterococcus, both sensitive to Cipro. He has not been treated because he still has chronic foley catheter, and is not symptomatic.  02/22/11 PUS - 70.14 grams  Past Medical History Problems  1. History of aortic aneurysm (Z86.79) 2. History of hypertension (Z86.79) 3. History of Venous Thrombosis Of The Deep Vessels Of The Distal Lower Extremity Surgical History Problems  1. History of CABG 2. History of Heart Surgery 3. History of Prostatectomy Suprapubic 4. History of Surgery For Abdominal Aortic Aneurysm Current Meds 1. Caltrate 600+D TABS; Therapy: (Recorded:19Apr2017) to Recorded 2. Centrum Silver TABS; TAKE 1 TABLET DAILY; Therapy: (Recorded:25Jul2012) to Recorded 3. Fish Oil CAPS; Therapy:  (Recorded:19Apr2017) to Recorded 4. Losartan Potassium 100 MG Oral Tablet; Therapy: (Recorded:19Apr2017) to Recorded 5. Metoprolol Tartrate 25 MG Oral Tablet; 1/2 tablet twice daily; Therapy: 22Feb2012 to Recorded 6. Plavix 75 MG Oral Tablet; Therapy: (Recorded:19Apr2017) to Recorded 7. Simvastatin 40 MG Oral Tablet; 1/2 tablet once daily; Therapy: 22Feb2012 to Recorded 8. Tamsulosin HCl - 0.4 MG Oral Capsule; Take one capsule daily; Therapy: 12Jun2012 to (Evaluate:10Aug2017) Requested for: 15Aug2016; Last Rx:15Aug2016 Ordered 9. Vitamin D3 1000 UNIT Oral Tablet; Therapy: (Recorded:19Apr2017) to Recorded Allergies Medication  1. Aspirin TABS Family History Problems  1. Family history of Family Health Status Number Of Children  1son 1 daughter 2. Family history of Nephrolithiasis : Father 3. Family history of Prostate Cancer : Brother Social History Problems  1. Marital History - Currently Married 2. Occupation:  retired 73. Tobacco Use  1 pack 20 years quit 36 years ago Review of Systems Genitourinary, constitutional, skin, eye, otolaryngeal, hematologic/lymphatic, cardiovascular, pulmonary, endocrine, musculoskeletal, gastrointestinal, neurological and psychiatric system(s) were reviewed and pertinent findings if present are noted and are otherwise negative.  Genitourinary: nocturia.  Results/Data Selected Results  AU CT-HEMATURIA PROTOCOL 21Apr2017 12:00AM Carolan Clines   Test Name Result Flag Reference  AU CT-HEMATURIA PROTOCOL (Report)    ** RADIOLOGY REPORT BY [facility] ** CLINICAL DATA: Gross hematuria. Open prostatectomy. EXAM: CT ABDOMEN AND PELVIS WITHOUT AND WITH CONTRAST TECHNIQUE: Multidetector CT imaging of the abdomen and pelvis was performed following the standard protocol before and following the bolus administration of intravenous contrast. CONTRAST: 125 cc Isovue-300. COMPARISON: 02/28/2011 and 01/05/2011. FINDINGS: Lower  chest: Tiny nodules in the right lower lobe are unchanged and therefore benign. Heart is enlarged. No pericardial or pleural effusion.  Descending thoracic aortic aneurysm measures at least 4.8 cm, incompletely imaged. Hepatobiliary: Liver is unremarkable. Stones are seen in the gallbladder. No biliary ductal dilatation. Pancreas: Negative. Spleen: Negative. Adrenals/Urinary Tract: Adrenal glands are unremarkable. No urinary stones. Interval collapse of a previously seen 7.2 cm complex lesion off the lower pole right kidney, now measuring 2.2 cm with coarse calcification. Scarring in the interpolar right kidney. Low-attenuation lesions in the kidneys measure up to 2.9 cm on the left. Those measuring greater than 1 cm in size show no enhancement, consistent with cysts. Others are too small to definitively characterize. Bladder is grossly unremarkable. High density along the posterior wall of bladder arise from the prostate. Stomach/Bowel: Stomach, small bowel, appendix and colon are unremarkable. Vascular/Lymphatic: Atherosclerotic calcification of the arterial vasculature with infrarenal aortic aneurysm repair. IVC filter is in place. No pathologically enlarged lymph nodes. Reproductive: Prostate is moderately enlarged. Other: Small bilateral inguinal hernias contain fat. Mesenteries and peritoneum are unremarkable. No free fluid. Upper midline ventral hernia contains omental fat. Musculoskeletal: No worrisome lytic or sclerotic lesions. IMPRESSION: 1. Moderately enlarged prostate. 2. Descending thoracic aortic aneurysm. 3. Cholelithiasis. Electronically Signed By: Lorin Picket M.D. On: 10/09/2015 15:45   CREATININE with eGFR [id number] 04:42PM Carolan Clines  SPECIMEN TYPE: BLOOD   Test Name Result Flag Reference  CREATININE 1.01 mg/dL  0.50-1.50  Est GFR, African American 78 mL/min  >=60  Est GFR, NonAfrican American 68 mL/min  >=60  THE ESTIMATED GFR IS A CALCULATION  VALID FOR ADULTS (>=60 YEARS OLD) THAT USES THE CKD-EPI ALGORITHM TO ADJUST FOR AGE AND SEX. IT IS  NOT TO BE USED FOR CHILDREN, PREGNANT WOMEN, HOSPITALIZED PATIENTS,  PATIENTS ON DIALYSIS, OR WITH RAPIDLY CHANGING KIDNEY FUNCTION. ACCORDING TO THE NKDEP, EGFR >89 IS NORMAL, 60-89 SHOWS MILD IMPAIRMENT, 30-59 SHOWS MODERATE IMPAIRMENT, 15-29 SHOWS SEVERE IMPAIRMENT AND <15 IS ESRD.   PSA REFLEX TO FREE 19Apr2017 04:42PM Carolan Clines  SPECIMEN TYPE: BLOOD   Test Name Result Flag Reference  PSA 8.27 ng/mL H <=4.00  TEST METHODOLOGY: ECLIA PSA (ELECTROCHEMILUMINESCENCE IMMUNOASSAY)  PSA, FREE 2.69 ng/mL    PSA, %FREE 33 %  > 25  PROBABILITY OF PROSTATE CANCER (FOR MEN WITH NON-SUSPICIOUS DRE RESULTS AND PSA BETWEEN 4 AND 10 NG/ML, BY PATIENT AGE) % FREE PSA PATIENT AGE 67 TO 59 YEARS 60 TO 69 YEARS >70 YEARS <=10% 49.2% 57.5% 64.5% 11 - 18% 26.9% 33.9% 40.8% 19 - 25% 18.3% 23.9% 29.7% >25% 9.1% 12.2% 15.8%   HEMOGLOBIN & HEMATOCRIT 19Apr2017 04:42PM Carolan Clines  SPECIMEN TYPE: BLOOD   Test Name Result Flag Reference  HEMATOCRIT 41.8 %  38.5-50.0  ** PLEASE NOTE CHANGE IN UNIT OF MEASURE AND REFERENCE RANGE(S). **  HEMOGLOBIN 14.2 g/dL  13.2-17.1  Procedure Procedure: Cystoscopy  Chaperone Present: kim lewis.  Indication: Hematuria.  Informed Consent: Risks, benefits, and potential adverse events were discussed and informed consent was obtained from the patient.  Prep: The patient was prepped with betadine.  Anesthesia:. Local anesthesia was administered intraurethrally with 2% lidocaine jelly.  Antibiotic prophylaxis: Ciprofloxacin.  Procedure Note:  Urethral meatus:. No abnormalities.  Anterior urethra: No abnormalities.  Prostatic urethra:. The lateral prostatic lobes were enlarged. A bladder neck contracture was present. Was identified. Prostatic stones enlargedc prostate, bleeding.  Bladder: Visulization was clear. The ureteral orifices were in the normal  anatomic position bilaterally. A systematic survey of the bladder demonstrated no bladder tumors or stones. Examination of the bladder demonstrated clot within the bladder and trabeculation no erythematous mucosa  and no ulcer. The patient tolerated the procedure well.  Complications: None.  Assessment Gross hematuria with enlarged prostate, with multiple stones obstructing the prostatic fossa, which appeared to be prostatic stones. The patient will need TURP to open up the prostatic fossa. He has minimal symptoms, however.  Plan TURP.  Discussion/Summary cc: Dr. Deland Pretty  Signatures Electronically signed by : Carolan Clines, M.D.; Nov 18 2015 5:31PM EST  Note: Chronic foley cath: c/s: Aceniterbacter Sensitive Cipro, then seen in ED for foley drainage problem, with c/s:  Staph, Rx Fosfomycin. Res : Cipro, Tcn, Septra. Sensitive  GENT, Macrodantin, Vac.    Will add Gent to antibiotic coverage pre-op per pharmacy 1- time dosing.  The information contained in this medical record document is considered private and confidential patient information. This information can only be used for the medical diagnosis and/or medical services that are being provided by the patient's selected caregivers. This information can only be distributed outside of the patient's care if the patient agrees and signs waivers of authorization for this information to be sent to an outside source or route.

## 2015-12-24 ENCOUNTER — Encounter (HOSPITAL_COMMUNITY): Payer: Self-pay | Admitting: *Deleted

## 2015-12-24 ENCOUNTER — Ambulatory Visit (HOSPITAL_COMMUNITY): Payer: Medicare Other | Admitting: Anesthesiology

## 2015-12-24 ENCOUNTER — Observation Stay (HOSPITAL_COMMUNITY)
Admission: RE | Admit: 2015-12-24 | Discharge: 2015-12-25 | Disposition: A | Discharge status: Home / Self Care | Payer: Medicare Other | Source: Ambulatory Visit | Attending: Urology | Admitting: Urology

## 2015-12-24 ENCOUNTER — Encounter (HOSPITAL_COMMUNITY)
Admission: RE | Disposition: A | Discharge status: Home / Self Care | Payer: Self-pay | Source: Ambulatory Visit | Attending: Urology

## 2015-12-24 DIAGNOSIS — Z8679 Personal history of other diseases of the circulatory system: Secondary | ICD-10-CM | POA: Diagnosis not present

## 2015-12-24 DIAGNOSIS — Z09 Encounter for follow-up examination after completed treatment for conditions other than malignant neoplasm: Secondary | ICD-10-CM | POA: Diagnosis present

## 2015-12-24 DIAGNOSIS — I2510 Atherosclerotic heart disease of native coronary artery without angina pectoris: Secondary | ICD-10-CM | POA: Diagnosis not present

## 2015-12-24 DIAGNOSIS — Z79899 Other long term (current) drug therapy: Secondary | ICD-10-CM | POA: Diagnosis not present

## 2015-12-24 DIAGNOSIS — I739 Peripheral vascular disease, unspecified: Secondary | ICD-10-CM | POA: Insufficient documentation

## 2015-12-24 DIAGNOSIS — Z7902 Long term (current) use of antithrombotics/antiplatelets: Secondary | ICD-10-CM | POA: Diagnosis not present

## 2015-12-24 DIAGNOSIS — I351 Nonrheumatic aortic (valve) insufficiency: Secondary | ICD-10-CM | POA: Diagnosis not present

## 2015-12-24 DIAGNOSIS — N400 Enlarged prostate without lower urinary tract symptoms: Secondary | ICD-10-CM | POA: Diagnosis not present

## 2015-12-24 DIAGNOSIS — N401 Enlarged prostate with lower urinary tract symptoms: Secondary | ICD-10-CM | POA: Diagnosis not present

## 2015-12-24 DIAGNOSIS — Z951 Presence of aortocoronary bypass graft: Secondary | ICD-10-CM | POA: Diagnosis not present

## 2015-12-24 DIAGNOSIS — I10 Essential (primary) hypertension: Secondary | ICD-10-CM | POA: Insufficient documentation

## 2015-12-24 DIAGNOSIS — Z87891 Personal history of nicotine dependence: Secondary | ICD-10-CM | POA: Diagnosis not present

## 2015-12-24 DIAGNOSIS — R310 Gross hematuria: Secondary | ICD-10-CM | POA: Diagnosis not present

## 2015-12-24 HISTORY — PX: TRANSURETHRAL RESECTION OF PROSTATE: SHX73

## 2015-12-24 SURGERY — TURP (TRANSURETHRAL RESECTION OF PROSTATE)
Anesthesia: General

## 2015-12-24 MED ORDER — SENNA 8.6 MG PO TABS
1.0000 | ORAL_TABLET | Freq: Two times a day (BID) | ORAL | Status: DC
  Administered 2015-12-24: 8.6 mg via ORAL
  Filled 2015-12-24 (×2): qty 1

## 2015-12-24 MED ORDER — PROMETHAZINE HCL 25 MG/ML IJ SOLN: 6.2500 mg | INTRAMUSCULAR | Status: DC | PRN

## 2015-12-24 MED ORDER — CALCIUM CARBONATE 1500 (600 CA) MG PO TABS
600.0000 mg | ORAL_TABLET | Freq: Two times a day (BID) | ORAL | Status: DC
  Filled 2015-12-24: qty 1

## 2015-12-24 MED ORDER — PROPOFOL 10 MG/ML IV BOLUS
INTRAVENOUS | Status: DC | PRN
  Administered 2015-12-24 (×2): 20 mg via INTRAVENOUS
  Administered 2015-12-24: 80 mg via INTRAVENOUS

## 2015-12-24 MED ORDER — OXYBUTYNIN CHLORIDE 5 MG PO TABS: 5.0000 mg | ORAL_TABLET | Freq: Three times a day (TID) | ORAL | Status: DC | PRN

## 2015-12-24 MED ORDER — EPHEDRINE SULFATE 50 MG/ML IJ SOLN
INTRAMUSCULAR | Status: DC | PRN
  Administered 2015-12-24 (×2): 10 mg via INTRAVENOUS
  Administered 2015-12-24 (×2): 5 mg via INTRAVENOUS

## 2015-12-24 MED ORDER — MIDAZOLAM HCL 2 MG/2ML IJ SOLN
INTRAMUSCULAR | Status: AC
  Filled 2015-12-24: qty 2

## 2015-12-24 MED ORDER — PROPOFOL 10 MG/ML IV BOLUS
INTRAVENOUS | Status: AC
  Filled 2015-12-24: qty 20

## 2015-12-24 MED ORDER — SODIUM CHLORIDE 0.9 % IR SOLN
Status: DC | PRN
  Administered 2015-12-24: 36000 mL

## 2015-12-24 MED ORDER — ACETAMINOPHEN 325 MG PO TABS: 650.0000 mg | ORAL_TABLET | ORAL | Status: DC | PRN

## 2015-12-24 MED ORDER — LACTATED RINGERS IV SOLN
INTRAVENOUS | Status: DC
  Administered 2015-12-24 – 2015-12-25 (×3): via INTRAVENOUS

## 2015-12-24 MED ORDER — ADULT MULTIVITAMIN W/MINERALS CH
1.0000 | ORAL_TABLET | Freq: Every day | ORAL | Status: DC
  Administered 2015-12-24: 1 via ORAL
  Filled 2015-12-24 (×3): qty 1

## 2015-12-24 MED ORDER — LIDOCAINE 2% (20 MG/ML) 5 ML SYRINGE
INTRAMUSCULAR | Status: DC | PRN
  Administered 2015-12-24: 50 mg via INTRAVENOUS

## 2015-12-24 MED ORDER — FENTANYL CITRATE (PF) 100 MCG/2ML IJ SOLN
INTRAMUSCULAR | Status: AC
  Filled 2015-12-24: qty 2

## 2015-12-24 MED ORDER — METOPROLOL TARTRATE 12.5 MG HALF TABLET
12.5000 mg | ORAL_TABLET | Freq: Two times a day (BID) | ORAL | Status: DC
  Filled 2015-12-24 (×2): qty 1

## 2015-12-24 MED ORDER — BELLADONNA ALKALOIDS-OPIUM 16.2-60 MG RE SUPP
RECTAL | Status: AC
  Filled 2015-12-24: qty 1

## 2015-12-24 MED ORDER — LACTATED RINGERS IV SOLN
INTRAVENOUS | Status: DC | PRN
  Administered 2015-12-24: 07:00:00 via INTRAVENOUS

## 2015-12-24 MED ORDER — LOSARTAN POTASSIUM 50 MG PO TABS
50.0000 mg | ORAL_TABLET | Freq: Every day | ORAL | Status: DC
  Administered 2015-12-24: 50 mg via ORAL
  Filled 2015-12-24: qty 1

## 2015-12-24 MED ORDER — BELLADONNA ALKALOIDS-OPIUM 16.2-60 MG RE SUPP
RECTAL | Status: DC | PRN
  Administered 2015-12-24: 1 via RECTAL

## 2015-12-24 MED ORDER — CEFAZOLIN SODIUM-DEXTROSE 2-4 GM/100ML-% IV SOLN
2.0000 g | Freq: Once | INTRAVENOUS | Status: AC
  Administered 2015-12-24: 2 g via INTRAVENOUS

## 2015-12-24 MED ORDER — KETOROLAC TROMETHAMINE 30 MG/ML IJ SOLN
INTRAMUSCULAR | Status: DC | PRN
  Administered 2015-12-24: 15 mg via INTRAVENOUS

## 2015-12-24 MED ORDER — CALCIUM CARBONATE 1250 (500 CA) MG PO TABS
1.0000 | ORAL_TABLET | Freq: Two times a day (BID) | ORAL | Status: DC
  Administered 2015-12-24: 500 mg via ORAL
  Filled 2015-12-24 (×2): qty 1

## 2015-12-24 MED ORDER — ONDANSETRON HCL 4 MG/2ML IJ SOLN: 4.0000 mg | INTRAMUSCULAR | Status: DC | PRN

## 2015-12-24 MED ORDER — KETOROLAC TROMETHAMINE 30 MG/ML IJ SOLN
INTRAMUSCULAR | Status: AC
  Filled 2015-12-24: qty 1

## 2015-12-24 MED ORDER — VITAMIN D 1000 UNITS PO TABS
1000.0000 [IU] | ORAL_TABLET | Freq: Every day | ORAL | Status: DC
  Administered 2015-12-24: 1000 [IU] via ORAL
  Filled 2015-12-24: qty 1

## 2015-12-24 MED ORDER — DOCUSATE SODIUM 100 MG PO CAPS
100.0000 mg | ORAL_CAPSULE | Freq: Two times a day (BID) | ORAL | Status: DC
  Administered 2015-12-24: 100 mg via ORAL
  Filled 2015-12-24 (×2): qty 1

## 2015-12-24 MED ORDER — FENTANYL CITRATE (PF) 250 MCG/5ML IJ SOLN
INTRAMUSCULAR | Status: DC | PRN
  Administered 2015-12-24 (×2): 50 ug via INTRAVENOUS

## 2015-12-24 MED ORDER — MORPHINE SULFATE (PF) 10 MG/ML IV SOLN: 2.0000 mg | INTRAVENOUS | Status: DC | PRN

## 2015-12-24 MED ORDER — HYDROMORPHONE HCL 1 MG/ML IJ SOLN: 0.2500 mg | INTRAMUSCULAR | Status: DC | PRN

## 2015-12-24 MED ORDER — LIDOCAINE HCL (CARDIAC) 20 MG/ML IV SOLN
INTRAVENOUS | Status: AC
  Filled 2015-12-24: qty 5

## 2015-12-24 MED ORDER — OXYCODONE HCL 5 MG PO TABS: 5.0000 mg | ORAL_TABLET | ORAL | Status: DC | PRN

## 2015-12-24 MED ORDER — ONDANSETRON HCL 4 MG/2ML IJ SOLN
INTRAMUSCULAR | Status: DC | PRN
  Administered 2015-12-24: 4 mg via INTRAVENOUS

## 2015-12-24 MED ORDER — CEFAZOLIN SODIUM-DEXTROSE 2-4 GM/100ML-% IV SOLN
INTRAVENOUS | Status: AC
  Filled 2015-12-24: qty 100

## 2015-12-24 MED ORDER — EPHEDRINE SULFATE 50 MG/ML IJ SOLN
INTRAMUSCULAR | Status: AC
  Filled 2015-12-24: qty 1

## 2015-12-24 MED ORDER — SIMVASTATIN 20 MG PO TABS
20.0000 mg | ORAL_TABLET | Freq: Every day | ORAL | Status: DC
  Administered 2015-12-24: 20 mg via ORAL
  Filled 2015-12-24: qty 1

## 2015-12-24 MED ORDER — ONDANSETRON HCL 4 MG/2ML IJ SOLN
INTRAMUSCULAR | Status: AC
  Filled 2015-12-24: qty 2

## 2015-12-24 MED ORDER — MIDAZOLAM HCL 5 MG/5ML IJ SOLN
INTRAMUSCULAR | Status: DC | PRN
  Administered 2015-12-24: 1 mg via INTRAVENOUS

## 2015-12-24 SURGICAL SUPPLY — 14 items
BAG URINE DRAINAGE (UROLOGICAL SUPPLIES) ×1 IMPLANT
BAG URO CATCHER STRL LF (MISCELLANEOUS) ×2 IMPLANT
CATH HEMA 3WAY 30CC 22FR COUDE (CATHETERS) ×2 IMPLANT
CLOTH BEACON ORANGE TIMEOUT ST (SAFETY) ×2 IMPLANT
GLOVE BIOGEL M STRL SZ7.5 (GLOVE) ×2 IMPLANT
GOWN STRL REUS W/TWL LRG LVL3 (GOWN DISPOSABLE) ×2 IMPLANT
GOWN STRL REUS W/TWL XL LVL3 (GOWN DISPOSABLE) ×2 IMPLANT
HOLDER FOLEY CATH W/STRAP (MISCELLANEOUS) ×1 IMPLANT
LOOP CUT BIPOLAR 24F LRG (ELECTROSURGICAL) ×1 IMPLANT
MANIFOLD NEPTUNE II (INSTRUMENTS) ×2 IMPLANT
PACK CYSTO (CUSTOM PROCEDURE TRAY) ×2 IMPLANT
SYR 30ML LL (SYRINGE) ×1 IMPLANT
SYRINGE IRR TOOMEY STRL 70CC (SYRINGE) ×2 IMPLANT
TUBING CONNECTING 10 (TUBING) ×4 IMPLANT

## 2015-12-24 NOTE — Op Note (Signed)
Pre-operative diagnosis :   BPH  Postoperative diagnosis: BPH  Operation: 1. Transurethral resection of prostate, complete 2. Simple Foley catheter placement (24 [place] three-way hematuria catheter)  Surgeon:  S. Gaynelle Arabian, MD  First assistant: Virginia Crews, M.D.  Anesthesia:  general  Preparation: After appropriate pre-medication, the patient was brought to the operating room, and placed upon the operating table in the dorsal supine position, where general LMA anesthesia was introduced. He was then replaced in the dorsal lithotomy position, where the pubis and scrotum were prepped and draped in the usual fashion. The history ws reviewed, and the armband was doublechecked.   Review history:  . Gross hematuria (R31.0)  Assessed By: Carolan Clines (Urology); Last Assessed: 07 Oct 2015 History of Present Illness 82 YO male returns today for a cystoscopy for hx of 2 episodes of gross hematuria back before Thanksgiving. He does take Plavix. Patient states that he is voiding well with good control. He continues to take Tamsulosin. He is s/p open prostatectomy 05/09/11. Pathology: benign prostate tissue. No evidence of malignancy. 09/03/10 PSA - 5.88/17% He is a personal friend of Luvenia Heller. Hx of chronic urinary retention. He is post-op [facility] Nov 12, 2010, followed by RLE phlebitis, treated with anticoagulation. However, he developed gross hematuria along with his chronic urinary retention, and CT showed massive R renal hemorrhagic cyst. He was considered for caval stent, but the cyst was pushing in on the IVC, and he was considered too high a risk for the procedure. He has had chronic foley cath, despite years of finasteride, and more recent tamsulosin. He has failed multiple voiding trials. He has recently had videourodynamics which show 0 flow, with 35cm H20 bladder pressure. Note recent urine c/s showing Acinetobacter and Enterococcus, both sensitive to Cipro. He has not been treated because he  still has chronic foley catheter, and is not symptomatic.  02/22/11 PUS - 70.14 grams   Statement of  Likelihood of Success: Excellent. TIME-OUT observed.:  Procedure: The patient was brought into the operating room and general anesthesia was induced. He was given preoperative antibiotics with Ancef 2 g. He was then laced into dorsal lithotomy position and a surgical timeout was performed. We first inserted the 22 French rigid cystoscope with copious lubrication and irrigation running. We were able to visualize bilateral obstructing lateral lobes of his prostate with very friable mucosa. At the bladder appeared to have trabeculated walls with edema present. Bilateral ureteral orifices were visualized. We then switched to the 26 [place] resectoscope with Beatrix Fetters working element and bipolar electrocautery loop. We resected the lateral lobes of the prostate almost down to the static capsule placement. We then ensured adequate hemostasis with minimal irrigation running. We used a Toomey syringe to evacuate all prostate chips from the bladder and sent this for pathology. We again ensured hemostasis with minimal irrigation and bilateral ureteral orifices were visualized and uninvolved. We then left the bladder full and inserted a 24Fr three hematuria catheter with pink colored urine draining. The catheter was placed to traction and CBI was connected at a slow drip. We then inserted a B&O catheter per rectum. The procedure was concluded and the patient was taken to the recovery area.

## 2015-12-24 NOTE — Interval H&P Note (Signed)
History and Physical Interval Note:  12/24/2015 7:16 AM  Charles Terry  has presented today for surgery, with the diagnosis of GROSS HEMATURIA BPH  The various methods of treatment have been discussed with the patient and family. After consideration of risks, benefits and other options for treatment, the patient has consented to  Procedure(s): TRANSURETHRAL RESECTION OF THE PROSTATE (TURP) (N/A) as a surgical intervention .  The patient's history has been reviewed, patient examined, no change in status, stable for surgery.  I have reviewed the patient's chart and labs.  Questions were answered to the patient's satisfaction.    Plan: TURP and cystolitholapaxy. Will plan to admit for 23 hrs, and leave foley cath until urine clear. ( over weekend).  Ilze Roselli I Lucah Petta

## 2015-12-24 NOTE — Transfer of Care (Signed)
Immediate Anesthesia Transfer of Care Note  Patient: Charles Terry  Procedure(s) Performed: Procedure(s): TRANSURETHRAL RESECTION OF THE PROSTATE (TURP) (N/A)  Patient Location: PACU  Anesthesia Type:General  Level of Consciousness: awake, oriented and patient cooperative  Airway & Oxygen Therapy: Patient Spontanous Breathing and Patient connected to face mask oxygen  Post-op Assessment: Report given to RN, Post -op Vital signs reviewed and stable and Patient moving all extremities  Post vital signs: Reviewed and stable  Last Vitals:  Filed Vitals:   12/24/15 0945 12/24/15 1000  BP: 131/67 135/74  Pulse: 59 58  Temp:    Resp: 10 13    Last Pain: There were no vitals filed for this visit.       Complications: No apparent anesthesia complications

## 2015-12-24 NOTE — Anesthesia Postprocedure Evaluation (Signed)
Anesthesia Post Note  Patient: Charles Terry  Procedure(s) Performed: Procedure(s) (LRB): TRANSURETHRAL RESECTION OF THE PROSTATE (TURP) (N/A)  Patient location during evaluation: PACU Anesthesia Type: General Level of consciousness: sedated Pain management: pain level controlled Vital Signs Assessment: post-procedure vital signs reviewed and stable Respiratory status: spontaneous breathing and respiratory function stable Cardiovascular status: stable Anesthetic complications: no    Last Vitals:  Filed Vitals:   12/24/15 1015 12/24/15 1030  BP: 140/63 142/72  Pulse: 55 55  Temp:  35.8 C  Resp: 13 11    Last Pain: There were no vitals filed for this visit.               Maxi Carreras DANIEL

## 2015-12-24 NOTE — Discharge Summary (Signed)
Date of admission: 12/24/2015  Date of discharge: 12/25/2015  Admission diagnosis: BPH  Discharge diagnosis: BPH  Secondary diagnoses: none  History and Physical: For full details, please see admission history and physical. Briefly, Charles Terry is a 82 y.o. year old patient with BPH.   Hospital Course: He was taken to the operating room on 12/24/2015 and underwent a transurethral resection of prostate. He tolerated this procedure well and without complications. Postoperatively, he was able to be transferred to a regular hospital room following recovery from anesthesia. He was able to begin ambulating the night of surgery. He remained hemodynamically stable overnight. He had excellent urine output which remained clear and CBI was stopped on the AM of POD#1. He was transitioned to oral pain medication, and had met all discharge criteria and was able to be discharged home later on POD#1 with foley in place to be removed in clinic Monday.  Laboratory values:   Recent Labs  12/25/15 0402  HGB 12.1*  HCT 36.0*    Recent Labs  12/25/15 0402  CREATININE 1.07    Disposition: Home  Discharge instruction:      Discharge Instructions    Discharge instructions    Complete by:  As directed   You can resume Plavix 5 days after surgery.  Activity:  You are encouraged to ambulate frequently (about every hour during waking hours) to help prevent blood clots from forming in your legs or lungs.  However, you should not engage in any heavy lifting (> 10-15 lbs), strenuous activity, or straining for two weeks. Diet: You should advance your diet as instructed by your physician.  It will be normal to have some bloating, nausea, and abdominal discomfort intermittently. Prescriptions:  You will be provided a prescription for pain medication to take as needed.  If your pain is not severe enough to require the prescription pain medication, you may take extra strength Tylenol instead which will have  less side effects.  You should also take a prescribed stool softener to avoid straining with bowel movements as the prescription pain medication may constipate you.  What to call us about: You should call the office [phone]) if you develop fever > 101 or develop persistent vomiting.           Discharge medications:    Medication List    TAKE these medications        CALTRATE 600 PO  Take 600 mg by mouth 2 (two) times daily.     clopidogrel 75 MG tablet  Commonly known as:  PLAVIX  Take 1 tablet (75 mg total) by mouth every other day.     D3-1000 PO  Take 1,000 Units by mouth daily.     fish oil-omega-3 fatty acids 1000 MG capsule  Take 1 g by mouth daily.     losartan 100 MG tablet  Commonly known as:  COZAAR  TAKE ONE-HALF TABLET BY MOUTH DAILY. .     metoprolol tartrate 25 MG tablet  Commonly known as:  LOPRESSOR  Take 12.5 mg by mouth 2 (two) times daily.     multivitamins ther. w/minerals Tabs tablet  Take 1 tablet by mouth daily.     simvastatin 40 MG tablet  Commonly known as:  ZOCOR  Take 20 mg by mouth at bedtime.     tamsulosin 0.4 MG Caps capsule  Commonly known as:  FLOMAX  Take 0.4 mg by mouth daily after supper.     trimethoprim 100 MG tablet  Commonly  known as:  TRIMPEX  Take 2 tablets (200 mg total) by mouth daily.  Start taking on:  12/26/2015        Followup:

## 2015-12-24 NOTE — Anesthesia Procedure Notes (Signed)
Procedure Name: LMA Insertion Date/Time: 12/24/2015 7:38 AM Performed by: Melina Copa, Akaya Proffit R Pre-anesthesia Checklist: Patient identified, Emergency Drugs available, Suction available and Patient being monitored Patient Re-evaluated:Patient Re-evaluated prior to inductionOxygen Delivery Method: Circle System Utilized Preoxygenation: Pre-oxygenation with 100% oxygen Intubation Type: IV induction Ventilation: Mask ventilation without difficulty LMA: LMA inserted LMA Size: 5.0 Number of attempts: 1 Placement Confirmation: positive ETCO2 Tube secured with: Tape Dental Injury: Teeth and Oropharynx as per pre-operative assessment

## 2015-12-25 DIAGNOSIS — N400 Enlarged prostate without lower urinary tract symptoms: Secondary | ICD-10-CM | POA: Diagnosis not present

## 2015-12-25 LAB — BASIC METABOLIC PANEL
Anion gap: 5 (ref 5–15)
BUN: 18 mg/dL (ref 6–20)
CALCIUM: 8.8 mg/dL — AB (ref 8.9–10.3)
CHLORIDE: 105 mmol/L (ref 101–111)
CO2: 27 mmol/L (ref 22–32)
CREATININE: 1.07 mg/dL (ref 0.61–1.24)
GFR calc Af Amer: >60 mL/min (ref >60)
GFR calc non Af Amer: >60 mL/min (ref >60)
Glucose, Bld: 109 mg/dL — ABNORMAL HIGH (ref 65–99)
Potassium: 4.2 mmol/L (ref 3.5–5.1)
SODIUM: 137 mmol/L (ref 135–145)

## 2015-12-25 LAB — CBC
HEMATOCRIT: 36 % — AB (ref 39.0–52.0)
HEMOGLOBIN: 12.1 g/dL — AB (ref 13.0–17.0)
MCH: 31.8 pg (ref 26.0–34.0)
MCHC: 33.6 g/dL (ref 30.0–36.0)
MCV: 94.5 fL (ref 78.0–100.0)
Platelets: 109 10*3/uL — ABNORMAL LOW (ref 150–400)
RBC: 3.81 MIL/uL — ABNORMAL LOW (ref 4.22–5.81)
RDW: 14.6 % (ref 11.5–15.5)
WBC: 8.2 10*3/uL (ref 4.0–10.5)

## 2015-12-25 MED ORDER — TRIMETHOPRIM 100 MG PO TABS: 200.0000 mg | ORAL_TABLET | Freq: Every day | ORAL | Status: AC

## 2015-12-25 NOTE — Progress Notes (Signed)
1 Day Post-Op Subjective: The patient is doing well.  No nausea or vomiting. Pain is adequately controlled. Urine is pink in color this AM. He did ambulate last night.  Hb 12 from 14.  Objective: Vital signs in last 24 hours: Temp:  [96.3 F (35.7 C)-98.3 F (36.8 C)] 98.2 F (36.8 C) (07/07 0623) Pulse Rate:  [55-65] 58 (07/07 0623) Resp:  [10-16] 16 (07/07 0623) BP: (103-144)/(54-74) 123/62 mmHg (07/07 0623) SpO2:  [96 %-100 %] 96 % (07/07 0623) FiO2 (%):  [2 %] 2 % (07/06 1436)  Intake/Output from previous day: 07/06 0701 - 07/07 0700 In: 14356.7 [I.V.:1956.7] Out: [id number] [Urine:15325; Blood:250] Intake/Output this shift: Total I/O In: 2200 [I.V.:400; Other:1800] Out: 3275 [Urine:3275]  Physical Exam:  General: Alert and oriented. CV: RRR Lungs: Clear bilaterally. GI: Soft, Nondistended. Incisions: Clean, dry, and intact Urine: Light pink in color Extremities: Nontender, no erythema, no edema.  Lab Results:  Recent Labs  12/25/15 0402  HGB 12.1*  HCT 36.0*      Assessment/Plan: POD# 1 s/p TURP  1) SL IVF and stop CBI 2) Ambulate, Incentive spirometry 3) Plan for likely discharge today with foley catheter in place and trial of void Monday  Milford Cage Sukhu 12/25/2015, 6:48 AM   Urology Attending Note: Pt seen and examined. Urine clearing. Pt feels well. He is ready for discharge and RTC for catheter removal. His wife will get antibiotic ointment for him to put on his meatus and catheter over the weekend.

## 2015-12-25 NOTE — Progress Notes (Signed)
Provided leg bag and foley care education to the patient and his wife. CBI port plugged for discharge home with foley catheter in place. Pt and his wife verbalized and demonstrated understanding of foley care.  Charles Terry [facility] 12/25/2015 8:25 AM

## 2015-12-25 NOTE — Progress Notes (Signed)
Discharge instructions reviewed with patient and wife. Both verbalized understanding with no questions.

## 2016-01-01 DIAGNOSIS — R339 Retention of urine, unspecified: Secondary | ICD-10-CM | POA: Diagnosis not present

## 2016-04-01 DIAGNOSIS — Z23 Encounter for immunization: Secondary | ICD-10-CM | POA: Diagnosis not present

## 2016-04-12 DIAGNOSIS — N401 Enlarged prostate with lower urinary tract symptoms: Secondary | ICD-10-CM | POA: Diagnosis not present

## 2016-04-12 DIAGNOSIS — R3121 Asymptomatic microscopic hematuria: Secondary | ICD-10-CM | POA: Diagnosis not present

## 2016-05-03 ENCOUNTER — Ambulatory Visit
Admission: RE | Admit: 2016-05-03 | Discharge: 2016-05-03 | Disposition: A | Discharge status: Home / Self Care | Payer: Medicare Other | Source: Ambulatory Visit | Attending: Vascular Surgery | Admitting: Vascular Surgery

## 2016-05-03 ENCOUNTER — Ambulatory Visit: Payer: Medicare Other | Admitting: Vascular Surgery

## 2016-05-03 DIAGNOSIS — I712 Thoracic aortic aneurysm, without rupture, unspecified: Secondary | ICD-10-CM

## 2016-05-03 MED ORDER — IOPAMIDOL (ISOVUE-370) INJECTION 76%
75.0000 mL | Freq: Once | INTRAVENOUS | Status: AC | PRN
  Administered 2016-05-03: 75 mL via INTRAVENOUS

## 2016-05-04 ENCOUNTER — Encounter: Payer: Self-pay | Admitting: Vascular Surgery

## 2016-05-10 ENCOUNTER — Ambulatory Visit (INDEPENDENT_AMBULATORY_CARE_PROVIDER_SITE_OTHER): Payer: Medicare Other | Admitting: Vascular Surgery

## 2016-05-10 ENCOUNTER — Encounter: Payer: Self-pay | Admitting: Vascular Surgery

## 2016-05-10 VITALS — BP 137/77 | HR 50 | Temp 97.0°F | Resp 18 | Ht 68.75 in | Wt 171.8 lb

## 2016-05-10 DIAGNOSIS — I712 Thoracic aortic aneurysm, without rupture, unspecified: Secondary | ICD-10-CM

## 2016-05-10 NOTE — Progress Notes (Signed)
[facility] [place]  Patient name: Charles Terry MRN: [id number] DOB: 20-Feb-1928 Sex: male  REASON FOR VISIT: Follow-up thoracic aortic aneurysm  HPI: Charles Terry is a 82 y.o. male today for follow-up. Well-known to me from prior renal open aneurysm repair in 2005. He had a recent CT scan of his abdomen for evaluation of lower back after prostate cancer. This revealed thoracic aneurysm and the more proximal cuts of his abdominal film. He is here today for discussion of recent CT angiogram of his chest. He remains quite active at age 8. He is here today with his wife. He is still able to do his usual daily routine. He reports that he does not play golf any longer because he does not feel that he has energy. No symptoms referable to his aneurysm. He does have a prior history of DVT and has an indwelling vena cava filter  Past Medical History:  Diagnosis Date  . BPH (benign prostatic hypertrophy) with urinary obstruction 11/12   surg  . Coronary artery disease involving native coronary artery 2005   100% LAD, 90% proximal circumflex followed by 80% OM1, 100% RCA. Right PDA and left DL in a codominant system --> CABG '05. low risk Nuc 5/12  . Dyslipidemia   . Essential hypertension   . H/O transfusion of packed red blood cells MAY 2012  . History of AAA (abdominal aortic aneurysm) repair 2006   AAA R&G Hemasheild 14 mm x 10 mm (Dr. Donnetta Hutching);; CT scan in 2013 shows 4.8 cm thoracic aortic aneurysm - the followed by Dr. Donnetta Hutching  . History of blood clots    right lower extremity dvt  after inguinal hernia surgery may 2012--doppler follow up report 04/07/11 at [id number] heart and vaxcular shows resolution of the dvt--but pt is to have IVC filter placed 05/05/11--prior to planned prostate surgery scheduled for 05/09/11.  [name] History of DVT of lower extremity    right lower extremity dvt  after inguinal hernia surgery may 2012--doppler  follow up report 04/07/11 at [facility] heart and vaxcular shows resolution of the dvt--but pt is to have IVC filter placed 05/05/11--prior to planned prostate surgery scheduled for 05/09/11.  [name] History of hematuria   . Renal cyst may 2012   bleeding from right renal cyst /acute renal failure secondary to urinary retention from bph --pt states  the cyst is no longer a problem--pt still has indwelling foley catheter--plans  open  suprapubic prostatectomy on 11/19 with dr. Gaynelle Arabian  . S/P CABG x 5 2005    LIMA-LAD, SeqSVG-OM1-OM2, Seq SVG- RPDA-LPL  . S/P insertion of IVC (inferior vena caval) filter 05/05/11   prior to prostate surgery    Family History  Problem Relation Age of Onset  . Aneurysm Mother   . Cancer Father     Lung cancer  . Heart attack Brother   . Alzheimer's disease Brother     SOCIAL HISTORY: Social History  Substance Use Topics  . Smoking status: Former Smoker    Packs/day: 1.00    Years: 20.00    Quit date: 12/17/1965  . Smokeless tobacco: Never Used  . Alcohol use 1.0 - 1.5 oz/week    2 - 3 Standard drinks or equivalent per week     Comment: OCCAS    Allergies  Allergen Reactions  . Food Anaphylaxis    NO KIWI FRUIT   . Aspirin Rash and Other (See Comments)    ANGIOEDEMA    Current Outpatient  Prescriptions  Medication Sig Dispense Refill  . Calcium Carbonate (CALTRATE 600 PO) Take 600 mg by mouth 2 (two) times daily.     . Cholecalciferol (D3-1000 PO) Take 1,000 Units by mouth daily.     . clopidogrel (PLAVIX) 75 MG tablet Take 1 tablet (75 mg total) by mouth every other day. 90 tablet 3  . fish oil-omega-3 fatty acids 1000 MG capsule Take 1 g by mouth daily.    [name] losartan (COZAAR) 100 MG tablet TAKE ONE-HALF TABLET BY MOUTH DAILY. . (Patient taking differently: Take 50 mg by mouth daily. ) 90 tablet 3  . metoprolol tartrate (LOPRESSOR) 25 MG tablet Take 12.5 mg by mouth 2 (two) times daily.     . Multiple Vitamins-Minerals (MULTIVITAMINS THER.  W/MINERALS) TABS Take 1 tablet by mouth daily.     . simvastatin (ZOCOR) 40 MG tablet Take 20 mg by mouth at bedtime.     . tamsulosin (FLOMAX) 0.4 MG CAPS capsule Take 0.4 mg by mouth daily after supper.      No current facility-administered medications for this visit.     REVIEW OF SYSTEMS:  [X]  denotes positive finding, [ ]  denotes negative finding Cardiac  Comments:  Chest pain or chest pressure:    Shortness of breath upon exertion:    Short of breath when lying flat:    Irregular heart rhythm:        Vascular    Pain in calf, thigh, or hip brought on by ambulation:    Pain in feet at night that wakes you up from your sleep:     Blood clot in your veins:    Leg swelling:           PHYSICAL EXAM: Vitals:   05/10/16 1224  BP: 137/77  Pulse: (!) 50  Resp: 18  Temp: 97 F (36.1 C)  TempSrc: Oral  SpO2: 98%  Weight: 171 lb 12.8 oz (77.9 kg)  Height: 5' 8.75" (1.746 m)    GENERAL: The patient is a well-nourished male, in no acute distress. The vital signs are documented above. CARDIOVASCULAR: 2+ radial and 2+ femoral pulses. Carotid arteries without bruits bilaterally PULMONARY: There is good air exchange  MUSCULOSKELETAL: There are no major deformities or cyanosis. NEUROLOGIC: No focal weakness or paresthesias are detected. SKIN: There are no ulcers or rashes noted. PSYCHIATRIC: The patient has a normal affect.  DATA:  CT antrum of his chest revealed diffuse widening throughout his thoracic and thoracoabdominal aorta. He does have discrete visible change in the mid descending thoracic aorta with a maximal diameter of 5.4 cm and some mural thrombus.  MEDICAL ISSUES: Discuss options with the patient. I have recommended follow-up CT scan in one year to rule out rapid growth. Do not feel that he would be an open surgical candidate. He would in all likelihood be a candidate for stent graft if he had increased size. Did explain the risk of rupture with the patient he knows  to present immediately to the emergency room should he have any chest or back pain    Rosetta Posner, MD [facility] Tel [phone] Pager [phone]

## 2016-05-11 NOTE — Addendum Note (Signed)
Addended by: Lianne Cure A on: 05/11/2016 02:03 PM   Modules accepted: Orders

## 2016-06-27 DIAGNOSIS — H2513 Age-related nuclear cataract, bilateral: Secondary | ICD-10-CM | POA: Diagnosis not present

## 2016-09-02 DIAGNOSIS — R310 Gross hematuria: Secondary | ICD-10-CM | POA: Diagnosis not present

## 2016-09-08 DIAGNOSIS — R3121 Asymptomatic microscopic hematuria: Secondary | ICD-10-CM | POA: Diagnosis not present

## 2016-09-08 DIAGNOSIS — K8020 Calculus of gallbladder without cholecystitis without obstruction: Secondary | ICD-10-CM | POA: Diagnosis not present

## 2016-09-08 DIAGNOSIS — N281 Cyst of kidney, acquired: Secondary | ICD-10-CM | POA: Diagnosis not present

## 2016-09-22 ENCOUNTER — Other Ambulatory Visit (HOSPITAL_COMMUNITY): Payer: Self-pay | Admitting: Urology

## 2016-09-22 DIAGNOSIS — N281 Cyst of kidney, acquired: Secondary | ICD-10-CM

## 2016-09-26 DIAGNOSIS — N401 Enlarged prostate with lower urinary tract symptoms: Secondary | ICD-10-CM | POA: Diagnosis not present

## 2016-09-26 DIAGNOSIS — R351 Nocturia: Secondary | ICD-10-CM | POA: Diagnosis not present

## 2016-10-04 ENCOUNTER — Ambulatory Visit (HOSPITAL_COMMUNITY)
Admission: RE | Admit: 2016-10-04 | Discharge: 2016-10-04 | Disposition: A | Discharge status: Home / Self Care | Payer: Medicare Other | Source: Ambulatory Visit | Attending: Urology | Admitting: Urology

## 2016-10-04 DIAGNOSIS — N281 Cyst of kidney, acquired: Secondary | ICD-10-CM | POA: Diagnosis not present

## 2016-10-04 DIAGNOSIS — M47896 Other spondylosis, lumbar region: Secondary | ICD-10-CM | POA: Insufficient documentation

## 2016-10-04 DIAGNOSIS — I708 Atherosclerosis of other arteries: Secondary | ICD-10-CM | POA: Diagnosis not present

## 2016-10-04 DIAGNOSIS — M5136 Other intervertebral disc degeneration, lumbar region: Secondary | ICD-10-CM | POA: Insufficient documentation

## 2016-10-04 DIAGNOSIS — K8020 Calculus of gallbladder without cholecystitis without obstruction: Secondary | ICD-10-CM | POA: Diagnosis not present

## 2016-10-04 DIAGNOSIS — I712 Thoracic aortic aneurysm, without rupture: Secondary | ICD-10-CM | POA: Diagnosis not present

## 2016-10-04 DIAGNOSIS — I700 Atherosclerosis of aorta: Secondary | ICD-10-CM | POA: Diagnosis not present

## 2016-10-04 MED ORDER — GADOBENATE DIMEGLUMINE 529 MG/ML IV SOLN
20.0000 mL | Freq: Once | INTRAVENOUS | Status: AC | PRN
  Administered 2016-10-04: 20 mL via INTRAVENOUS

## 2016-10-07 DIAGNOSIS — N401 Enlarged prostate with lower urinary tract symptoms: Secondary | ICD-10-CM | POA: Diagnosis not present

## 2016-10-07 DIAGNOSIS — R972 Elevated prostate specific antigen [PSA]: Secondary | ICD-10-CM | POA: Diagnosis not present

## 2016-10-07 DIAGNOSIS — R351 Nocturia: Secondary | ICD-10-CM | POA: Diagnosis not present

## 2016-10-07 DIAGNOSIS — R310 Gross hematuria: Secondary | ICD-10-CM | POA: Diagnosis not present

## 2016-12-01 DIAGNOSIS — Z0000 Encounter for general adult medical examination without abnormal findings: Secondary | ICD-10-CM | POA: Diagnosis not present

## 2016-12-01 DIAGNOSIS — I10 Essential (primary) hypertension: Secondary | ICD-10-CM | POA: Diagnosis not present

## 2016-12-01 DIAGNOSIS — E785 Hyperlipidemia, unspecified: Secondary | ICD-10-CM | POA: Diagnosis not present

## 2016-12-01 DIAGNOSIS — Z125 Encounter for screening for malignant neoplasm of prostate: Secondary | ICD-10-CM | POA: Diagnosis not present

## 2016-12-01 DIAGNOSIS — E1121 Type 2 diabetes mellitus with diabetic nephropathy: Secondary | ICD-10-CM | POA: Diagnosis not present

## 2016-12-06 DIAGNOSIS — I712 Thoracic aortic aneurysm, without rupture: Secondary | ICD-10-CM | POA: Diagnosis not present

## 2016-12-06 DIAGNOSIS — Z0001 Encounter for general adult medical examination with abnormal findings: Secondary | ICD-10-CM | POA: Diagnosis not present

## 2016-12-06 DIAGNOSIS — D696 Thrombocytopenia, unspecified: Secondary | ICD-10-CM | POA: Diagnosis not present

## 2016-12-06 DIAGNOSIS — E1121 Type 2 diabetes mellitus with diabetic nephropathy: Secondary | ICD-10-CM | POA: Diagnosis not present

## 2016-12-07 ENCOUNTER — Telehealth: Payer: Self-pay | Admitting: Cardiology

## 2016-12-07 NOTE — Telephone Encounter (Signed)
12/07/16 - Received incoming records from [facility] for upcoming appointment on 12/16/16 @ 11am with Dr. Ellyn Hack. Records given to [facility]. aib

## 2016-12-15 DIAGNOSIS — I10 Essential (primary) hypertension: Secondary | ICD-10-CM | POA: Diagnosis not present

## 2016-12-16 ENCOUNTER — Encounter: Payer: Self-pay | Admitting: Cardiology

## 2016-12-16 ENCOUNTER — Ambulatory Visit (INDEPENDENT_AMBULATORY_CARE_PROVIDER_SITE_OTHER): Payer: Medicare Other | Admitting: Cardiology

## 2016-12-16 VITALS — BP 110/60 | HR 51 | Wt 169.0 lb

## 2016-12-16 DIAGNOSIS — Z95828 Presence of other vascular implants and grafts: Secondary | ICD-10-CM | POA: Diagnosis not present

## 2016-12-16 DIAGNOSIS — I10 Essential (primary) hypertension: Secondary | ICD-10-CM

## 2016-12-16 DIAGNOSIS — I358 Other nonrheumatic aortic valve disorders: Secondary | ICD-10-CM | POA: Diagnosis not present

## 2016-12-16 DIAGNOSIS — E785 Hyperlipidemia, unspecified: Secondary | ICD-10-CM

## 2016-12-16 DIAGNOSIS — I2510 Atherosclerotic heart disease of native coronary artery without angina pectoris: Secondary | ICD-10-CM | POA: Diagnosis not present

## 2016-12-16 NOTE — Patient Instructions (Signed)
NO CHANGE WITH CURRENT MEDICATIONS    Your physician wants you to follow-up in [place].You will receive a reminder letter in the mail two months in advance. If you don't receive a letter, please call our office to schedule the follow-up appointment.

## 2016-12-16 NOTE — Progress Notes (Signed)
PCP: Deland Pretty, MD  Clinic Note: Chief Complaint  Patient presents with  . Follow-up  . Coronary Artery Disease    HPI: Charles Terry is a 83 y.o. male with a PMH below who presents today for Annual follow-up for CAD. He also is seeing Dr. Donnetta Hutching for his thoracic aortic aneurysm  CAD h/o CABG x5 in 2005 (LIMA-LAD, sequential SVG-OM1-OM 2, rectal SVG-RPDA-LPL) in response to a catheterization showing 100% LAD occlusion, 90% proximal circumflex - 80-90% OM1 as well as totally occluded RCA.   His most recent stress test was in May 2012 which was normal.   An echocardiogram at that time as well that showed an EF of 45-50% with anterior hypokinesis. Mild aortic valve sclerosis.   He also has a history of AAA repair with a Hemashield graft 14 limited millimeters in 2006 by Dr. Donnetta Hutching.  - He also has a history of DVT with IVC filter placement.  His CAD was actually found surreptitiously with an abnormal EKG leading to a nuclear stress test showing ischemia. This led to cardiac catheterization revealing multivessel CAD. He never really had any anginal symptoms.  Charles Terry was last seen on 12/08/2015 -> partly for preoperative evaluation for prostate surgery. No active symptoms at that time. We did not do any further evaluation as he just had an echocardiogram done. No stress tests ordered. The plan was to repeat stress test prior to this visit, we decided to cancel it after he did well with the surgery.  Recent Hospitalizations: None  Studies Personally Reviewed - (if available, images/films reviewed: From Epic Chart or Care Everywhere)  No new studies  Interval History: Charles Terry presents today overall doing quite well from a cardiac standpoint. He tells me that he is walking a mile everyday (indicating that his wife is the Secondary school teacher ". He has no symptoms of chest tightness or pressure with rest or exertion. No sense of significant exertional dyspnea unless he rushes  upstairs or up an incline.   In addition to doing his daily walking, he also does yard work and housework without any difficulties. He doesn't really notice any significant change in energy.level. No PND, orthopnea or edema. No palpitations, lightheadedness, dizziness, weakness or syncope/near syncope. No TIA/amaurosis fugax symptoms. No melena, hematochezia, hematuria, or epstaxis. No claudication.  ROS: A comprehensive was performed. Review of Systems  Constitutional: Negative for malaise/fatigue.  HENT: Negative for nosebleeds.   Respiratory: Negative for cough, shortness of breath and wheezing.   Cardiovascular:       Per history of present illness  Gastrointestinal: Negative for blood in stool, constipation, heartburn and melena.  Genitourinary: Negative for dysuria and hematuria.  Musculoskeletal: Negative for joint pain (Just routine arthritis pains).  Skin: Negative.   Neurological: Positive for dizziness (If he stands up too quickly).  Endo/Heme/Allergies: Negative for environmental allergies.  Psychiatric/Behavioral: Negative for depression and memory loss. The patient does not have insomnia.   All other systems reviewed and are negative.  I have reviewed and (if needed) personally updated the patient's problem list, medications, allergies, past medical and surgical history, social and family history.   Past Medical History:  Diagnosis Date  . BPH (benign prostatic hypertrophy) with urinary obstruction 11/12   surg  . Coronary artery disease involving native coronary artery 2005   100% LAD, 90% proximal circumflex followed by 80% OM1, 100% RCA. Right PDA and left DL in a codominant system --> CABG '05. low risk Nuc 5/12  . Dyslipidemia   .  Essential hypertension   . H/O transfusion of packed red blood cells MAY 2012  . History of AAA (abdominal aortic aneurysm) repair 2006   AAA R&G Hemasheild 14 mm x 10 mm (Dr. Donnetta Hutching);; CT scan in 2013 shows 4.8 cm thoracic aortic  aneurysm - the followed by Dr. Donnetta Hutching  . History of blood clots    right lower extremity dvt  after inguinal hernia surgery may 2012--doppler follow up report 04/07/11 at [facility] heart and vaxcular shows resolution of the dvt--but pt is to have IVC filter placed 05/05/11--prior to planned prostate surgery scheduled for 05/09/11.  [name] History of DVT of lower extremity    right lower extremity dvt  after inguinal hernia surgery may 2012--doppler follow up report 04/07/11 at [facility] heart and vaxcular shows resolution of the dvt--but pt is to have IVC filter placed 05/05/11--prior to planned prostate surgery scheduled for 05/09/11.  [name] History of hematuria   . Renal cyst may 2012   bleeding from right renal cyst /acute renal failure secondary to urinary retention from bph --pt states  the cyst is no longer a problem--pt still has indwelling foley catheter--plans  open  suprapubic prostatectomy on 11/19 with dr. Gaynelle Arabian  . S/P CABG x 5 2005    LIMA-LAD, SeqSVG-OM1-OM2, Seq SVG- RPDA-LPL  . S/P insertion of IVC (inferior vena caval) filter 05/05/11   prior to prostate surgery    Past Surgical History:  Procedure Laterality Date  . 2D ECHOCARDIOGRAM  11/02/2010; June 2017   a. EF 45-50%, w/ anterior hypokinesis, otherwise normal; mildly sclerotic aortic valve;; b.EF 60-65%. No regional wall motion abnormality. Grade 1 diastolic dysfunction. Mildly reduced RV function. Mild elevation of pulmonary pressures   . AAA RPAIR  03/29/2005   14x84mm Hemashield graft  . CARDIAC CATHETERIZATION  12/23/2003   Recommended CABG; 100% LAD, 90% proximal circumflex followed by 80% OM1, 100% RCA. Right PDA and left DL in a codominant system.  . CORONARY ARTERY BYPASS GRAFT  12/25/2003   x5, LIMA-LAD, SeqSVG-OM1-OM2, Seq SVG- RPDA-LPL  . HERNIA REPAIR Left    MAY 2012  . IVC  11/12   placed prior to prostate surg  . PERSANTINE MYOVIEW  10/22/2010   Normal perfusion  . PROSTATECTOMY  05/09/2011   Procedure:  PROSTATECTOMY SUPRAPUBIC;  Surgeon: Ailene Rud, MD;  Location: [facility];  Service: Urology;  Laterality: N/A;  Open  . TRANSURETHRAL RESECTION OF PROSTATE N/A 12/24/2015   Procedure: TRANSURETHRAL RESECTION OF THE PROSTATE (TURP);  Surgeon: Carolan Clines, MD;  Location: [facility];  Service: Urology;  Laterality: N/A;  . VENOUS DOPPLER  04/04/2011   No evidence of thrombus or thrombophlebitis    No outpatient prescriptions have been marked as taking for the 12/16/16 encounter (Office Visit) with Leonie Man, MD.    Allergies  Allergen Reactions  . Food Anaphylaxis    NO KIWI FRUIT   . Aspirin Rash and Other (See Comments)    ANGIOEDEMA    Social History   Social History  . Marital status: Married    Spouse name: N/A  . Number of children: N/A  . Years of education: N/A   Social History Main Topics  . Smoking status: Former Smoker    Packs/day: 1.00    Years: 20.00    Quit date: 12/17/1965  . Smokeless tobacco: Never Used  . Alcohol use 1.0 - 1.5 oz/week    2 - 3 Standard drinks or equivalent per week     Comment: OCCAS  .  Drug use: No  . Sexual activity: Not Asked   Other Topics Concern  . None   Social History Narrative   Pleasant, married father of 2, grandfather 37. Exercises on a daily basis walking at least 1 mile. Does not smoke - quit over 20 years ago. Drinks social alcoholic beverage.    family history includes Alzheimer's disease in his brother; Aneurysm in his mother; Cancer in his father; Heart attack in his brother.  Wt Readings from Last 3 Encounters:  12/16/16 169 lb (76.7 kg)  05/10/16 171 lb 12.8 oz (77.9 kg)  12/24/15 169 lb 9 oz (76.9 kg)    PHYSICAL EXAM BP 110/60 (BP Location: Right Arm, Patient Position: Sitting, Cuff Size: Normal)   Pulse (!) 51   Wt 169 lb (76.7 kg)   BMI 25.14 kg/m  General appearance: alert, cooperative, appears stated age, no distress. Well-nourished, well-groomed. Healthy-appearing. HEENT: [place]/AT, EOMI,  MMM, anicteric sclera Neck: no adenopathy, no carotid bruit and no JVD Lungs: clear to auscultation bilaterally, normal percussion bilaterally and non-labored Heart: regular rate and rhythm, S1 &S2 normal, no click, rub or gallop; no HJR. 2/6 SEM@ RUSB-->Carotids Abdomen: soft, non-tender; bowel sounds normal; no masses,  no organomegaly; no HSM Extremities: extremities normal, atraumatic, no cyanosis, or edema  Pulses: 2+ and symmetric;  Skin: mobility and turgor normal, no evidence of bleeding or bruising and no lesions noted  Neurologic: Mental status: Alert & oriented x 3, thought content appropriate; non-focal exam.  Pleasant mood & affect.    Adult ECG Report  Rate: 51 ;  Rhythm: sinus bradycardia and Incomplete RBBB. Poor R-wave progression in precordial leads -> cannot exclude anterior MI, age undetermined. Otherwise normal axis, intervals and durations;   Narrative Interpretation: Stable EKG  Other studies Reviewed: Additional studies/ records that were reviewed today include:  Recent Labs:   Lab Results  Component Value Date   CHOL 83 11/20/2015   HDL 31 (L) 11/20/2015   LDLCALC 38 11/20/2015   TRIG 72 11/20/2015   CHOLHDL 2.7 11/20/2015   Lab Results  Component Value Date   CREATININE 1.07 12/25/2015   BUN 18 12/25/2015   NA 137 12/25/2015   K 4.2 12/25/2015   CL 105 12/25/2015   CO2 27 12/25/2015   1.  ASSESSMENT / PLAN: Problem List Items Addressed This Visit    Aortic valve sclerosis (Chronic)    Stable from 2017 echo. Very unlikely this is going to get worse over the next year so. Unless his murmur progresses notably, I would not recheck an echo.      Relevant Orders   EKG 12-Lead   Atherosclerosis of native coronary artery without angina pectoris - Primary (Chronic)    Significant multivessel native CAD referred for CABG in 2005. Last stress test was in 2012 and was nonischemic. We talked about doing surveillance stress tests and the plan was to have one  done before this visit. However he decided not to do it. We therefore had a talk today about what is desires would be, he seemed relatively noncommittal. I therefore pose the question to his wife he felt like they would prefer not to proceed with any noninvasive evaluation the absence of symptoms because he is doing quite well. They feel like waiting for him to have potential symptoms to evaluate would be more worthwhile option.   As such, we are not doing to do follow-up Myoview.   He will continue with his current dose of Plavix, low-dose Lopressor plus  losartan and simvastatin.       Relevant Orders   EKG 12-Lead   Dyslipidemia, goal LDL below 70 (Chronic)    On simvastatin and omega-3 fatty acids. His labs have been monitored by PCP. - Last labs I see her in June of last year and his lipids look great. Would continue current medications for now and will await findings from PCPs evaluation.      Relevant Orders   EKG 12-Lead   Essential hypertension (Chronic)    Very well controlled on current dose of ARB and beta blocker. Mild positional dizziness, encouraged adequate hydration No change      Relevant Orders   EKG 12-Lead   S/P IVC filter - placed electively prior to prostate surgery 11/12 (Chronic)    Monitor for signs of edema DVT. For now he is not on prophylactic anticoagulation.         Current medicines are reviewed at length with the patient today. (+/- concerns) n/a The following changes have been made: n/a  Patient Instructions  NO CHANGE WITH CURRENT MEDICATIONS    Your physician wants you to follow-up in [place].You will receive a reminder letter in the mail two months in advance. If you don't receive a letter, please call our office to schedule the follow-up appointment.    Studies Ordered:   Orders Placed This Encounter  Procedures  . EKG 12-Lead      Glenetta Hew, M.D., M.S. Interventional Cardiologist   Pager #  [phone] Phone # [phone] [place], [place] [address]

## 2016-12-18 ENCOUNTER — Encounter: Payer: Self-pay | Admitting: Cardiology

## 2016-12-18 NOTE — Assessment & Plan Note (Signed)
Monitor for signs of edema DVT. For now he is not on prophylactic anticoagulation.

## 2016-12-18 NOTE — Assessment & Plan Note (Signed)
On simvastatin and omega-3 fatty acids. His labs have been monitored by PCP. - Last labs I see her in June of last year and his lipids look great. Would continue current medications for now and will await findings from PCPs evaluation.

## 2016-12-18 NOTE — Assessment & Plan Note (Signed)
Significant multivessel native CAD referred for CABG in 2005. Last stress test was in 2012 and was nonischemic. We talked about doing surveillance stress tests and the plan was to have one done before this visit. However he decided not to do it. We therefore had a talk today about what is desires would be, he seemed relatively noncommittal. I therefore pose the question to his wife he felt like they would prefer not to proceed with any noninvasive evaluation the absence of symptoms because he is doing quite well. They feel like waiting for him to have potential symptoms to evaluate would be more worthwhile option.   As such, we are not doing to do follow-up Myoview.   He will continue with his current dose of Plavix, low-dose Lopressor plus losartan and simvastatin.

## 2016-12-18 NOTE — Assessment & Plan Note (Addendum)
Very well controlled on current dose of ARB and beta blocker. Mild positional dizziness, encouraged adequate hydration No change

## 2016-12-18 NOTE — Assessment & Plan Note (Signed)
Stable from 2017 echo. Very unlikely this is going to get worse over the next year so. Unless his murmur progresses notably, I would not recheck an echo.

## 2017-03-06 ENCOUNTER — Other Ambulatory Visit: Payer: Self-pay | Admitting: Cardiology

## 2017-03-31 DIAGNOSIS — Z23 Encounter for immunization: Secondary | ICD-10-CM | POA: Diagnosis not present

## 2017-04-03 DIAGNOSIS — R972 Elevated prostate specific antigen [PSA]: Secondary | ICD-10-CM | POA: Diagnosis not present

## 2017-04-03 DIAGNOSIS — N401 Enlarged prostate with lower urinary tract symptoms: Secondary | ICD-10-CM | POA: Diagnosis not present

## 2017-04-03 DIAGNOSIS — R351 Nocturia: Secondary | ICD-10-CM | POA: Diagnosis not present

## 2017-05-16 ENCOUNTER — Ambulatory Visit: Payer: Medicare Other | Admitting: Vascular Surgery

## 2017-06-26 ENCOUNTER — Other Ambulatory Visit: Payer: Self-pay

## 2017-06-26 DIAGNOSIS — I712 Thoracic aortic aneurysm, without rupture, unspecified: Secondary | ICD-10-CM

## 2017-06-27 ENCOUNTER — Ambulatory Visit: Payer: Medicare Other | Admitting: Vascular Surgery

## 2017-07-03 DIAGNOSIS — H2513 Age-related nuclear cataract, bilateral: Secondary | ICD-10-CM | POA: Diagnosis not present

## 2017-08-01 ENCOUNTER — Other Ambulatory Visit: Payer: Self-pay

## 2017-08-01 ENCOUNTER — Encounter: Payer: Self-pay | Admitting: Vascular Surgery

## 2017-08-01 ENCOUNTER — Ambulatory Visit: Payer: Medicare Other | Admitting: Vascular Surgery

## 2017-08-01 ENCOUNTER — Ambulatory Visit
Admission: RE | Admit: 2017-08-01 | Discharge: 2017-08-01 | Disposition: A | Discharge status: Home / Self Care | Payer: Medicare Other | Source: Ambulatory Visit | Attending: Vascular Surgery | Admitting: Vascular Surgery

## 2017-08-01 ENCOUNTER — Other Ambulatory Visit: Payer: Medicare Other

## 2017-08-01 VITALS — BP 113/68 | HR 52 | Temp 97.2°F | Resp 16 | Ht 67.5 in | Wt 170.0 lb

## 2017-08-01 DIAGNOSIS — I712 Thoracic aortic aneurysm, without rupture, unspecified: Secondary | ICD-10-CM

## 2017-08-01 MED ORDER — IOPAMIDOL (ISOVUE-370) INJECTION 76%
75.0000 mL | Freq: Once | INTRAVENOUS | Status: AC | PRN
  Administered 2017-08-01: 75 mL via INTRAVENOUS

## 2017-08-01 NOTE — Progress Notes (Signed)
[facility] [place]  Patient name: Charles Terry MRN: [id number] DOB: 1928/04/10 Sex: male  REASON FOR VISIT: Follow-up of thoracic aortic aneurysm  HPI: Charles Terry is a 84 y.o. male here today for follow-up.  He looks quite good and remains quite active despite his age of 33.  He is here today with his wife.  He has had no new medical difficulties.  Does have a history of prior open resection and grafting abdominal aortic aneurysm by myself in 2006.  Has known thoracic aneurysm with CT scan showing diameter of 4.8 in 2013.  He has had serial CT scans showing continued slow increase over time.  Past Medical History:  Diagnosis Date  . BPH (benign prostatic hypertrophy) with urinary obstruction 11/12   surg  . Coronary artery disease involving native coronary artery 2005   100% LAD, 90% proximal circumflex followed by 80% OM1, 100% RCA. Right PDA and left DL in a codominant system --> CABG '05. low risk Nuc 5/12  . Dyslipidemia   . Essential hypertension   . H/O transfusion of packed red blood cells MAY 2012  . History of AAA (abdominal aortic aneurysm) repair 2006   AAA R&G Hemasheild 14 mm x 10 mm (Dr. Donnetta Hutching);; CT scan in 2013 shows 4.8 cm thoracic aortic aneurysm - the followed by Dr. Donnetta Hutching  . History of blood clots    right lower extremity dvt  after inguinal hernia surgery may 2012--doppler follow up report 04/07/11 at [facility] heart and vaxcular shows resolution of the dvt--but pt is to have IVC filter placed 05/05/11--prior to planned prostate surgery scheduled for 05/09/11.  [name] History of DVT of lower extremity    right lower extremity dvt  after inguinal hernia surgery may 2012--doppler follow up report 04/07/11 at [facility] heart and vaxcular shows resolution of the dvt--but pt is to have IVC filter placed 05/05/11--prior to planned prostate surgery scheduled for 05/09/11.  [name] History of hematuria   . Renal  cyst may 2012   bleeding from right renal cyst /acute renal failure secondary to urinary retention from bph --pt states  the cyst is no longer a problem--pt still has indwelling foley catheter--plans  open  suprapubic prostatectomy on 11/19 with dr. Gaynelle Arabian  . S/P CABG x 5 2005    LIMA-LAD, SeqSVG-OM1-OM2, Seq SVG- RPDA-LPL  . S/P insertion of IVC (inferior vena caval) filter 05/05/11   prior to prostate surgery    Family History  Problem Relation Age of Onset  . Aneurysm Mother   . Cancer Father        Lung cancer  . Heart attack Brother   . Alzheimer's disease Brother     SOCIAL HISTORY: Social History   Tobacco Use  . Smoking status: Former Smoker    Packs/day: 1.00    Years: 20.00    Pack years: 20.00    Last attempt to quit: 12/17/1965    Years since quitting: 51.6  . Smokeless tobacco: Never Used  Substance Use Topics  . Alcohol use: Yes    Alcohol/week: 1.0 - 1.5 oz    Types: 2 - 3 Standard drinks or equivalent per week    Comment: OCCAS    Allergies  Allergen Reactions  . Food Anaphylaxis    NO KIWI FRUIT   . Aspirin Rash and Other (See Comments)    ANGIOEDEMA    Current Outpatient Medications  Medication Sig Dispense Refill  . Calcium Carbonate (CALTRATE 600 PO) Take  600 mg by mouth 2 (two) times daily.     . Cholecalciferol (D3-1000 PO) Take 1,000 Units by mouth daily.     . clopidogrel (PLAVIX) 75 MG tablet Take 1 tablet (75 mg total) by mouth every other day. 90 tablet 3  . fish oil-omega-3 fatty acids 1000 MG capsule Take 1 g by mouth daily.    [name] losartan (COZAAR) 100 MG tablet TAKE ONE-HALF TABLET BY MOUTH ONCE DAILY 45 tablet 7  . metoprolol tartrate (LOPRESSOR) 25 MG tablet Take 12.5 mg by mouth 2 (two) times daily.     . Multiple Vitamins-Minerals (MULTIVITAMINS THER. W/MINERALS) TABS Take 1 tablet by mouth daily.     . simvastatin (ZOCOR) 40 MG tablet Take 20 mg by mouth at bedtime.     . tamsulosin (FLOMAX) 0.4 MG CAPS capsule Take 0.4 mg by  mouth daily after supper.      No current facility-administered medications for this visit.     REVIEW OF SYSTEMS:  [X]  denotes positive finding, [ ]  denotes negative finding Cardiac  Comments:  Chest pain or chest pressure:    Shortness of breath upon exertion:    Short of breath when lying flat:    Irregular heart rhythm:        Vascular    Pain in calf, thigh, or hip brought on by ambulation:    Pain in feet at night that wakes you up from your sleep:     Blood clot in your veins:    Leg swelling:           PHYSICAL EXAM: Vitals:   08/01/17 1109  BP: 113/68  Pulse: (!) 52  Resp: 16  Temp: (!) 97.2 F (36.2 C)  TempSrc: Oral  SpO2: 97%  Weight: 170 lb (77.1 kg)  Height: 5' 7.5" (1.715 m)    GENERAL: The patient is a well-nourished male, in no acute distress. The vital signs are documented above. CARDIOVASCULAR: Carotid arteries without bruits bilaterally.  2+ radial pulses bilaterally.  Abdomen soft and nontender. PULMONARY: There is good air exchange  MUSCULOSKELETAL: There are no major deformities or cyanosis. NEUROLOGIC: No focal weakness or paresthesias are detected. SKIN: There are no ulcers or rashes noted. PSYCHIATRIC: The patient has a normal affect.  DATA:  CT scan from today reveals no change in the maximal diameter centimeters.  This arises below the level of the left subclavian takeoff and ends above the diaphragm.  There is more mural thrombus and today's imaging then there was a year ago.  MEDICAL ISSUES: Stable thoracic aneurysm at 5.4 cm in diameter.  No change ago.  I did explain the slight risk of rupture with his aneurysm.  Would recommend continued yearly surveillance and would only reck repair should he develop enlargement.  I do not feel that there is any survival benefit of treatment versus continued observation at his current size.  He understands and wished to continue surveillance.  We will see me again in 1 year    Rosetta Posner, MD  [facility] Tel [phone] Pager [phone]

## 2017-09-01 DIAGNOSIS — R509 Fever, unspecified: Secondary | ICD-10-CM | POA: Diagnosis not present

## 2017-09-01 DIAGNOSIS — H903 Sensorineural hearing loss, bilateral: Secondary | ICD-10-CM | POA: Diagnosis not present

## 2017-09-19 DIAGNOSIS — D485 Neoplasm of uncertain behavior of skin: Secondary | ICD-10-CM | POA: Diagnosis not present

## 2017-09-20 ENCOUNTER — Other Ambulatory Visit: Payer: Self-pay

## 2017-09-20 DIAGNOSIS — C44212 Basal cell carcinoma of skin of right ear and external auricular canal: Secondary | ICD-10-CM | POA: Diagnosis not present

## 2017-11-15 DIAGNOSIS — C4441 Basal cell carcinoma of skin of scalp and neck: Secondary | ICD-10-CM | POA: Diagnosis not present

## 2017-12-07 DIAGNOSIS — Z789 Other specified health status: Secondary | ICD-10-CM | POA: Diagnosis not present

## 2017-12-07 DIAGNOSIS — E785 Hyperlipidemia, unspecified: Secondary | ICD-10-CM | POA: Diagnosis not present

## 2017-12-07 DIAGNOSIS — D696 Thrombocytopenia, unspecified: Secondary | ICD-10-CM | POA: Diagnosis not present

## 2017-12-07 DIAGNOSIS — E119 Type 2 diabetes mellitus without complications: Secondary | ICD-10-CM | POA: Diagnosis not present

## 2017-12-12 DIAGNOSIS — E1121 Type 2 diabetes mellitus with diabetic nephropathy: Secondary | ICD-10-CM | POA: Diagnosis not present

## 2017-12-12 DIAGNOSIS — Z0001 Encounter for general adult medical examination with abnormal findings: Secondary | ICD-10-CM | POA: Diagnosis not present

## 2017-12-12 DIAGNOSIS — I2510 Atherosclerotic heart disease of native coronary artery without angina pectoris: Secondary | ICD-10-CM | POA: Diagnosis not present

## 2017-12-12 DIAGNOSIS — N401 Enlarged prostate with lower urinary tract symptoms: Secondary | ICD-10-CM | POA: Diagnosis not present

## 2017-12-14 ENCOUNTER — Telehealth: Payer: Self-pay | Admitting: Cardiology

## 2017-12-14 NOTE — Telephone Encounter (Signed)
Records received from [facility] on 12/14/17, Appt  12/18/17 @ 1:40PM. NV

## 2017-12-18 ENCOUNTER — Encounter: Payer: Self-pay | Admitting: Cardiology

## 2017-12-18 ENCOUNTER — Ambulatory Visit: Payer: Medicare Other | Admitting: Cardiology

## 2017-12-18 VITALS — BP 107/63 | HR 53 | Ht 67.5 in | Wt 171.4 lb

## 2017-12-18 DIAGNOSIS — I358 Other nonrheumatic aortic valve disorders: Secondary | ICD-10-CM

## 2017-12-18 DIAGNOSIS — E785 Hyperlipidemia, unspecified: Secondary | ICD-10-CM

## 2017-12-18 DIAGNOSIS — I2510 Atherosclerotic heart disease of native coronary artery without angina pectoris: Secondary | ICD-10-CM | POA: Diagnosis not present

## 2017-12-18 DIAGNOSIS — I10 Essential (primary) hypertension: Secondary | ICD-10-CM | POA: Diagnosis not present

## 2017-12-18 DIAGNOSIS — Z951 Presence of aortocoronary bypass graft: Secondary | ICD-10-CM | POA: Diagnosis not present

## 2017-12-18 NOTE — Patient Instructions (Signed)
NO CHANGE WITH MEDICATION   Your physician wants you to follow-up in [place].You will receive a reminder letter in the mail two months in advance. If you don't receive a letter, please call our office to schedule the follow-up appointment.   If you need a refill on your cardiac medications before your next appointment, please call your pharmacy.

## 2017-12-18 NOTE — Progress Notes (Signed)
PCP: Deland Pretty, MD  Vascular Surgeon: Dr. Sherren Mocha Early  Clinic Note: Chief Complaint  Patient presents with  . Follow-up    No complaints  . Coronary Artery Disease    HPI: Charles Terry is a 84 y.o. male with a PMH below who presents today for annual follow-up of CAD with known PAD (seen by Dr. Sherren Mocha Early from vascular surgery for thoracic aortic aneurysm).  His CAD was actually found surreptitiously with an abnormal EKG leading to a nuclear stress test showing ischemia. This led to cardiac catheterization revealing multivessel CAD. He never really had any anginal symptoms. CAD h/o CABG x5 in 2005 (LIMA-LAD, sequential SVG-OM1-OM 2, rectal SVG-RPDA-LPL) in response to a catheterization showing 100% LAD occlusion, 90% proximal circumflex - 80-90% OM1 as well as totally occluded RCA.  His most recent stress test was in May 2012 which was normal.   Most recent echo was from June 2017.-EF 60 to 65% with no R WMA.  GR 1 DD.  Mild AI, AS.  Mildly increased PA pressure restfully 37 mmHg. He also has a history of AAA repair with a Hemashield graft 14 mm x 10 mm in 2006 by Dr. Donnetta Hutching.   He also has a history of DVT with IVC filter placement.   Charles Terry was last seen on on December 16, 2016.  Was doing very well.  Was walking his daily mile without any chest discomfort or dyspnea.  Only noted dyspnea with going up a flight of steps fast. --Says that his wife keeps him on the "straight narrow with exercise."  Recent Hospitalizations:   None  Studies Personally Reviewed - (if available, images/films reviewed: From Epic Chart or Care Everywhere)  None  Interval History: Charles Terry returns here today overall feeling quite well.  He has not had any evidence of any chest tightness pressure with rest or exertion.  We never did check the Myoview that was recommended back in 2017, mostly because he was so asymptomatic. From a cardiac standpoint, he denies any heart failure symptoms of PND,  orthopnea or edema.  NoNo palpitations, lightheadedness, dizziness, weakness or syncope/near syncope. No TIA/amaurosis fugax symptoms. No melena, hematochezia, hematuria, or epstaxis. No claudication.    ROS: A comprehensive was performed. Review of Systems  Constitutional: Negative for malaise/fatigue.  HENT: Negative for congestion.   Respiratory: Negative for cough, shortness of breath and wheezing.   Cardiovascular:       Per HPI  Gastrointestinal: Negative for abdominal pain, blood in stool and melena.  Genitourinary: Negative for hematuria.  Musculoskeletal: Positive for joint pain (Routine arthritis pains).  Neurological: Negative for dizziness (Only if he gets up too quickly), focal weakness and weakness.  Psychiatric/Behavioral: Positive for memory loss (He is a little upset about having "failed his memory test "with his PCP.  He scored 23 out of 30 on his MMSE.).  All other systems reviewed and are negative.  I have reviewed and (if needed) personally updated the patient's problem list, medications, allergies, past medical and surgical history, social and family history.   Past Medical History:  Diagnosis Date  . BPH (benign prostatic hypertrophy) with urinary obstruction 11/12   surg  . Coronary artery disease involving native coronary artery 2005   100% LAD, 90% proximal circumflex followed by 80% OM1, 100% RCA. Right PDA and left DL in a codominant system --> CABG '05. low risk Nuc 5/12  . Dyslipidemia   . Essential hypertension   . H/O transfusion of packed  red blood cells MAY 2012  . History of AAA (abdominal aortic aneurysm) repair 2006   AAA R&G Hemasheild 14 mm x 10 mm (Dr. Donnetta Hutching);; CT scan in 2013 shows 4.8 cm thoracic aortic aneurysm - the followed by Dr. Donnetta Hutching  . History of blood clots    right lower extremity dvt  after inguinal hernia surgery may 2012--doppler follow up report 04/07/11 at [facility] heart and vaxcular shows resolution of the dvt--but pt is  to have IVC filter placed 05/05/11--prior to planned prostate surgery scheduled for 05/09/11.  [name] History of DVT of lower extremity    right lower extremity dvt  after inguinal hernia surgery may 2012--doppler follow up report 04/07/11 at [facility] heart and vaxcular shows resolution of the dvt--but pt is to have IVC filter placed 05/05/11--prior to planned prostate surgery scheduled for 05/09/11.  [name] History of hematuria   . Renal cyst may 2012   bleeding from right renal cyst /acute renal failure secondary to urinary retention from bph --pt states  the cyst is no longer a problem--pt still has indwelling foley catheter--plans  open  suprapubic prostatectomy on 11/19 with dr. Gaynelle Arabian  . S/P CABG x 5 2005    LIMA-LAD, SeqSVG-OM1-OM2, Seq SVG- RPDA-LPL  . S/P insertion of IVC (inferior vena caval) filter 05/05/11   prior to prostate surgery    Past Surgical History:  Procedure Laterality Date  . 2D ECHOCARDIOGRAM  11/02/2010; June 2017   a. EF 45-50%, w/ anterior hypokinesis, otherwise normal; mildly sclerotic aortic valve;; b.EF 60-65%. No regional wall motion abnormality. Grade 1 diastolic dysfunction. Mildly reduced RV function. Mild elevation of pulmonary pressures   . AAA RPAIR  03/29/2005   14x27mm Hemashield graft  . CARDIAC CATHETERIZATION  12/23/2003   Recommended CABG; 100% LAD, 90% proximal circumflex followed by 80% OM1, 100% RCA. Right PDA and left DL in a codominant system.  . CORONARY ARTERY BYPASS GRAFT  12/25/2003   x5, LIMA-LAD, SeqSVG-OM1-OM2, Seq SVG- RPDA-LPL  . HERNIA REPAIR Left    MAY 2012  . IVC  11/12   placed prior to prostate surg  . PERSANTINE MYOVIEW  10/22/2010   Normal perfusion  . PROSTATECTOMY  05/09/2011   Procedure: PROSTATECTOMY SUPRAPUBIC;  Surgeon: Ailene Rud, MD;  Location: [facility];  Service: Urology;  Laterality: N/A;  Open  . TRANSURETHRAL RESECTION OF PROSTATE N/A 12/24/2015   Procedure: TRANSURETHRAL RESECTION OF THE PROSTATE (TURP);   Surgeon: Carolan Clines, MD;  Location: [facility];  Service: Urology;  Laterality: N/A;  . VENOUS DOPPLER  04/04/2011   No evidence of thrombus or thrombophlebitis    Current Meds  Medication Sig  . Calcium Carbonate (CALTRATE 600 PO) Take 600 mg by mouth 2 (two) times daily.   . Cholecalciferol (D3-1000 PO) Take 1,000 Units by mouth daily.   . clopidogrel (PLAVIX) 75 MG tablet Take 1 tablet (75 mg total) by mouth every other day.  . fish oil-omega-3 fatty acids 1000 MG capsule Take 1 g by mouth daily.  [name] losartan (COZAAR) 100 MG tablet TAKE ONE-HALF TABLET BY MOUTH ONCE DAILY  . metoprolol tartrate (LOPRESSOR) 25 MG tablet Take 12.5 mg by mouth 2 (two) times daily.   . Multiple Vitamins-Minerals (MULTIVITAMINS THER. W/MINERALS) TABS Take 1 tablet by mouth daily.   . simvastatin (ZOCOR) 40 MG tablet Take 20 mg by mouth at bedtime.   . tamsulosin (FLOMAX) 0.4 MG CAPS capsule Take 0.4 mg by mouth daily after supper.     Allergies  Allergen Reactions  . Food Anaphylaxis    NO KIWI FRUIT   . Aspirin Rash and Other (See Comments)    ANGIOEDEMA    Social History   Tobacco Use  . Smoking status: Former Smoker    Packs/day: 1.00    Years: 20.00    Pack years: 20.00    Last attempt to quit: 12/17/1965    Years since quitting: 52.0  . Smokeless tobacco: Never Used  Substance Use Topics  . Alcohol use: Yes    Alcohol/week: 1.2 - 1.8 oz    Types: 2 - 3 Standard drinks or equivalent per week    Comment: OCCAS  . Drug use: No   Social History   Social History Narrative   Pleasant, married father of 2 (one daughter and one son), grandfather 75.    Exercises on a daily basis walking at least 1 mile. .   Does not smoke - quit over 20 years ago. Drinks social alcoholic beverage.    family history includes Alzheimer's disease in his brother; Aneurysm in his father and mother; Heart attack (age of onset: 45) in his brother; Lung cancer in his father; Prostate cancer in his  brother.  Wt Readings from Last 3 Encounters:  12/18/17 171 lb 6.4 oz (77.7 kg)  08/01/17 170 lb (77.1 kg)  12/16/16 169 lb (76.7 kg)    PHYSICAL EXAM BP 107/63   Pulse (!) 53   Ht 5' 7.5" (1.715 m)   Wt 171 lb 6.4 oz (77.7 kg)   BMI 26.45 kg/m  Physical Exam  Constitutional: He is oriented to person, place, and time. He appears well-developed and well-nourished. No distress.  Appears younger than stated age.  Healthy-appearing.  Well-groomed  HENT:  Head: Normocephalic and atraumatic.  Neck: Normal range of motion. Neck supple. No hepatojugular reflux and no JVD present. Carotid bruit is not present.  Cardiovascular: Normal rate, regular rhythm, S1 normal and intact distal pulses.  No extrasystoles are present. PMI is not displaced.  Murmur heard.  Crescendo-decrescendo midsystolic murmur is present with a grade of 2/6 at the upper right sternal border radiating to the neck. Mildly split S2 (inc RBBB)  Pulmonary/Chest: Effort normal and breath sounds normal. No respiratory distress. He has no wheezes.  Abdominal: Soft. Bowel sounds are normal. He exhibits no distension. There is no tenderness.  No HSM.  Unable to palpate aortic mass  Musculoskeletal: Normal range of motion. He exhibits no edema.  Lymphadenopathy:    He has no cervical adenopathy.  Neurological: He is alert and oriented to person, place, and time.  Psychiatric: He has a normal mood and affect. His behavior is normal. Judgment and thought content normal.  Vitals reviewed.   Adult ECG Report  Rate: 53 ;  Rhythm: sinus bradycardia and 1 degree AVB (216).  Incomplete RBBB.  Otherwise normal axis, intervals durations.;   Narrative Interpretation: Stable EKG   Other studies Reviewed: Additional studies/ records that were reviewed today include:  Recent Labs:   December 07, 2017: TC 124, TG 136, HDL 41, LDL 56.  A1c 6.3.  BUN/Cr 14/0.96.  Normal LFTs.  ASSESSMENT / PLAN: Problem List Items Addressed This Visit     S/P CABG x 5 (Chronic)   Relevant Orders   EKG 12-Lead (Completed)   Essential hypertension (Chronic)    Well-controlled on ARB and low-dose beta-blocker.  Continue to recommend hydration for positional dizziness.      Dyslipidemia, goal LDL below 70 (Chronic)  He is pretty much at goal with LDL 56 on omega-3 fatty acids and simvastatin. Followed by PCP.  Tolerating simvastatin without any myalgias.      Atherosclerosis of native coronary artery without angina pectoris - Primary (Chronic)    Severe native coronary disease status post CABG.  Per patient request, no further stress testing.  His last test was in 2012 when he was asymptomatic with nonischemic findings.  Plan will be to only evaluate if symptoms recur. He remains on Plavix and low-dose of beta-blocker and ARB.  Also on simvastatin.      Relevant Orders   EKG 12-Lead (Completed)   Aortic valve sclerosis (Chronic)    Unlikely to progress to aortic stenosis in his lifetime.      Relevant Orders   EKG 12-Lead (Completed)      Current medicines are reviewed at length with the patient today.  (+/- concerns) none The following changes have been made:  None  Patient Instructions  NO CHANGE WITH MEDICATION   Your physician wants you to follow-up in [place].You will receive a reminder letter in the mail two months in advance. If you don't receive a letter, please call our office to schedule the follow-up appointment.   If you need a refill on your cardiac medications before your next appointment, please call your pharmacy.     Studies Ordered:   Orders Placed This Encounter  Procedures  . EKG 12-Lead      Charles Terry, M.D., M.S. Interventional Cardiologist   Pager # [phone] Phone # [phone] [address] [place], [place] [address]   Thank you for choosing [facility] at [facility]!!

## 2017-12-21 ENCOUNTER — Encounter: Payer: Self-pay | Admitting: Cardiology

## 2017-12-21 NOTE — Assessment & Plan Note (Signed)
He is pretty much at goal with LDL 56 on omega-3 fatty acids and simvastatin. Followed by PCP.  Tolerating simvastatin without any myalgias.

## 2017-12-21 NOTE — Assessment & Plan Note (Signed)
Well-controlled on ARB and low-dose beta-blocker.  Continue to recommend hydration for positional dizziness.

## 2017-12-21 NOTE — Assessment & Plan Note (Signed)
Severe native coronary disease status post CABG.  Per patient request, no further stress testing.  His last test was in 2012 when he was asymptomatic with nonischemic findings.  Plan will be to only evaluate if symptoms recur. He remains on Plavix and low-dose of beta-blocker and ARB.  Also on simvastatin.

## 2017-12-21 NOTE — Assessment & Plan Note (Signed)
Unlikely to progress to aortic stenosis in his lifetime.

## 2018-02-15 ENCOUNTER — Telehealth: Payer: Self-pay | Admitting: Neurology

## 2018-02-15 ENCOUNTER — Ambulatory Visit: Payer: Medicare Other | Admitting: Neurology

## 2018-02-15 ENCOUNTER — Encounter: Payer: Self-pay | Admitting: Neurology

## 2018-02-15 DIAGNOSIS — E538 Deficiency of other specified B group vitamins: Secondary | ICD-10-CM | POA: Diagnosis not present

## 2018-02-15 DIAGNOSIS — R413 Other amnesia: Secondary | ICD-10-CM | POA: Diagnosis not present

## 2018-02-15 MED ORDER — DONEPEZIL HCL 5 MG PO TABS: 5.0000 mg | ORAL_TABLET | Freq: Every day | ORAL | 1 refills | Status: DC

## 2018-02-15 NOTE — Progress Notes (Signed)
Reason for visit: Memory disturbance  Referring physician: Dr. Dawayne Patricia ROMANO Terry is a 84 y.o. male  History of present illness:  Charles Terry is an 84 year old right-handed white male with a history of a memory disturbance.  The patient himself does not believe he has any troubles with memory whatsoever.  His wife is hesitant to indicate that he does or does not have a problem with memory.  The patient has had a lifelong issue with remembering names for people.  He requires assistance from his wife and keeping up with medications and appointments.  Apparently his wife has always done this for him.  The wife also does the finances, the patient has never really done this.  The patient operates a motor vehicle, he claims that he does well with this without difficulty with directions or with any safety issues.  He has not had problems with word finding problems, he denies issues misplacing things about the house.  The patient denies any numbness or weakness of the face, arms, legs.  He denies issues controlling the bowels or the bladder.  He has no balance issues.  He is sent to this office for an evaluation.  His brother and sister died with dementia.  Past Medical History:  Diagnosis Date  . BPH (benign prostatic hypertrophy) with urinary obstruction 11/12   surg  . Coronary artery disease involving native coronary artery 2005   100% LAD, 90% proximal circumflex followed by 80% OM1, 100% RCA. Right PDA and left DL in a codominant system --> CABG '05. low risk Nuc 5/12  . Dyslipidemia   . Essential hypertension   . H/O transfusion of packed red blood cells MAY 2012  . History of AAA (abdominal aortic aneurysm) repair 2006   AAA R&G Hemasheild 14 mm x 10 mm (Dr. Donnetta Hutching);; CT scan in 2013 shows 4.8 cm thoracic aortic aneurysm - the followed by Dr. Donnetta Hutching  . History of blood clots    right lower extremity dvt  after inguinal hernia surgery may 2012--doppler follow up report 04/07/11 at  [facility] heart and vaxcular shows resolution of the dvt--but pt is to have IVC filter placed 05/05/11--prior to planned prostate surgery scheduled for 05/09/11.  [name] History of DVT of lower extremity    right lower extremity dvt  after inguinal hernia surgery may 2012--doppler follow up report 04/07/11 at [facility] heart and vaxcular shows resolution of the dvt--but pt is to have IVC filter placed 05/05/11--prior to planned prostate surgery scheduled for 05/09/11.  [name] History of hematuria   . Renal cyst may 2012   bleeding from right renal cyst /acute renal failure secondary to urinary retention from bph --pt states  the cyst is no longer a problem--pt still has indwelling foley catheter--plans  open  suprapubic prostatectomy on 11/19 with dr. Gaynelle Arabian  . S/P CABG x 5 2005    LIMA-LAD, SeqSVG-OM1-OM2, Seq SVG- RPDA-LPL  . S/P insertion of IVC (inferior vena caval) filter 05/05/11   prior to prostate surgery    Past Surgical History:  Procedure Laterality Date  . 2D ECHOCARDIOGRAM  11/02/2010; June 2017   a. EF 45-50%, w/ anterior hypokinesis, otherwise normal; mildly sclerotic aortic valve;; b.EF 60-65%. No regional wall motion abnormality. Grade 1 diastolic dysfunction. Mildly reduced RV function. Mild elevation of pulmonary pressures   . AAA RPAIR  03/29/2005   14x29mm Hemashield graft  . CARDIAC CATHETERIZATION  12/23/2003   Recommended CABG; 100% LAD, 90% proximal circumflex followed by 80% OM1,  100% RCA. Right PDA and left DL in a codominant system.  . CORONARY ARTERY BYPASS GRAFT  12/25/2003   x5, LIMA-LAD, SeqSVG-OM1-OM2, Seq SVG- RPDA-LPL  . HERNIA REPAIR Left    MAY 2012  . IVC  11/12   placed prior to prostate surg  . PERSANTINE MYOVIEW  10/22/2010   Normal perfusion  . PROSTATECTOMY  05/09/2011   Procedure: PROSTATECTOMY SUPRAPUBIC;  Surgeon: Ailene Rud, MD;  Location: [facility];  Service: Urology;  Laterality: N/A;  Open  . TRANSURETHRAL RESECTION OF PROSTATE N/A  12/24/2015   Procedure: TRANSURETHRAL RESECTION OF THE PROSTATE (TURP);  Surgeon: Carolan Clines, MD;  Location: [facility];  Service: Urology;  Laterality: N/A;  . VENOUS DOPPLER  04/04/2011   No evidence of thrombus or thrombophlebitis    Family History  Problem Relation Age of Onset  . Aneurysm Mother        Died at age 54  . Aneurysm Father   . Lung cancer Father        Died at age 72  . Dementia Sister   . Prostate cancer Brother   . Heart attack Brother 2  . Dementia Brother   . Alzheimer's disease Brother     Social history:  reports that he quit smoking about 52 years ago. He has a 20.00 pack-year smoking history. He has never used smokeless tobacco. He reports that he drinks about 2.0 - 3.0 standard drinks of alcohol per week. He reports that he does not use drugs.  Medications:  Prior to Admission medications   Medication Sig Start Date End Date Taking? Authorizing Provider  Calcium Carbonate (CALTRATE 600 PO) Take 600 mg by mouth 2 (two) times daily.    Yes [provider]  Cholecalciferol (D3-1000 PO) Take 1,000 Units by mouth daily.    Yes [provider]  clopidogrel (PLAVIX) 75 MG tablet Take 1 tablet (75 mg total) by mouth every other day. 07/23/13  Yes Charles Man, MD  fish oil-omega-3 fatty acids 1000 MG capsule Take 1 g by mouth daily.   Yes [provider]  losartan (COZAAR) 100 MG tablet TAKE ONE-HALF TABLET BY MOUTH ONCE DAILY 03/06/17  Yes Charles Man, MD  metoprolol tartrate (LOPRESSOR) 25 MG tablet Take 12.5 mg by mouth 2 (two) times daily.    Yes [provider]  Multiple Vitamins-Minerals (MULTIVITAMINS THER. W/MINERALS) TABS Take 1 tablet by mouth daily.    Yes [provider]  simvastatin (ZOCOR) 40 MG tablet Take 20 mg by mouth at bedtime.    Yes [provider]  tamsulosin (FLOMAX) 0.4 MG CAPS capsule Take 0.4 mg by mouth daily after supper.  07/13/13  Yes [provider]  donepezil  (ARICEPT) 5 MG tablet Take 1 tablet (5 mg total) by mouth at bedtime. 02/15/18   Kathrynn Ducking, MD      Allergies  Allergen Reactions  . Food Anaphylaxis    NO KIWI FRUIT   . Aspirin Rash and Other (See Comments)    ANGIOEDEMA    ROS:  Out of a complete 14 system review of symptoms, the patient complains only of the following symptoms, and all other reviewed systems are negative.  Hearing loss  There were no vitals taken for this visit.  Physical Exam  General: The patient is alert and cooperative at the time of the examination.  Eyes: Pupils are equal, round, and reactive to light. Discs are flat bilaterally.  Neck: The neck is supple,  no carotid bruits are noted.  Respiratory: The respiratory examination is clear.  Cardiovascular: The cardiovascular examination reveals a regular rate and rhythm, no obvious murmurs or rubs are noted.  Skin: Extremities are without significant edema.  Neurologic Exam  Mental status: The patient is alert and oriented x 2 at the time of the examination (not oriented to date). The Mini-Mental status examination done today shows a total score of 19/30.  Cranial nerves: Facial symmetry is present. There is good sensation of the face to pinprick and soft touch bilaterally. The strength of the facial muscles and the muscles to head turning and shoulder shrug are normal bilaterally. Speech is well enunciated, no aphasia or dysarthria is noted. Extraocular movements are full. Visual fields are full. The tongue is midline, and the patient has symmetric elevation of the soft palate. No obvious hearing deficits are noted.  Motor: The motor testing reveals 5 over 5 strength of all 4 extremities. Good symmetric motor tone is noted throughout.  Sensory: Sensory testing is intact to pinprick, soft touch, vibration sensation, and position sense on all 4 extremities. No evidence of extinction is noted.  Coordination: Cerebellar testing reveals good  finger-nose-finger and heel-to-shin bilaterally.  Gait and station: Gait is normal. Tandem gait is unsteady. Romberg is negative. No drift is seen.  Reflexes: Deep tendon reflexes are symmetric and normal bilaterally. Toes are downgoing bilaterally.   Assessment/Plan:  1.  Memory disturbance  The patient has a Mini-Mental status examination score suggesting a moderate level of dementia.  The patient is still functioning at a fairly high level, however.  The patient will be sent for CT scan of the brain and he will be sent for blood work today.  The patient himself does not believe he has any memory issues, he has agreed to start low-dose Aricept, we will increase the dose if well tolerated after 30 days.  He will follow-up in 6 months.  Jill Alexanders MD 02/15/2018 2:32 PM  [facility] [address] [place], [place] [address]  Phone [phone] Fax [phone]

## 2018-02-15 NOTE — Patient Instructions (Signed)
We will start Aricept 5 mg at night for the memory.  Begin Aricept (donepezil) at 5 mg at night for one month. If this medication is well-tolerated, please call our office and we will call in a prescription for the 10 mg tablets. Look out for side effects that may include nausea, diarrhea, weight loss, or stomach cramps. This medication will also cause a runny nose, therefore there is no need for allergy medications for this purpose.

## 2018-02-15 NOTE — Telephone Encounter (Signed)
[employer] Josem Kaufmann: [id number] (exp. 02/15/18 to 03/16/18) order sent to GI. They will reach out to the pt to schedule.

## 2018-02-16 ENCOUNTER — Telehealth: Payer: Self-pay | Admitting: Neurology

## 2018-02-16 LAB — SEDIMENTATION RATE: SED RATE: 56 mm/h — AB (ref 0–30)

## 2018-02-16 LAB — VITAMIN B12: VITAMIN B 12: 584 pg/mL (ref 232–1245)

## 2018-02-16 NOTE — Telephone Encounter (Signed)
I called the patient.  Blood work shows a normal B12 level, sedimentation rate was moderately elevated at 56.  We will follow over time.  I discussed this with the patient.

## 2018-02-27 ENCOUNTER — Ambulatory Visit
Admission: RE | Admit: 2018-02-27 | Discharge: 2018-02-27 | Disposition: A | Discharge status: Home / Self Care | Payer: Medicare Other | Source: Ambulatory Visit | Attending: Neurology | Admitting: Neurology

## 2018-02-27 DIAGNOSIS — R413 Other amnesia: Secondary | ICD-10-CM

## 2018-03-01 ENCOUNTER — Telehealth: Payer: Self-pay | Admitting: Neurology

## 2018-03-01 NOTE — Telephone Encounter (Signed)
  I called the patient.  The CT of the brain does show some mild atrophy, there is ventriculomegaly but this appears to be associated with the degree of atrophy seen.  No significant small vessel disease is noted.  The patient will call if he is having problems tolerating the Aricept.   CT head 03/01/18:  IMPRESSION:   CT head (without) demonstrating: - Mild atrophy and ventriculomegaly. - No acute findings.

## 2018-03-15 ENCOUNTER — Other Ambulatory Visit: Payer: Self-pay | Admitting: Neurology

## 2018-03-15 MED ORDER — DONEPEZIL HCL 10 MG PO TABS: 10.0000 mg | ORAL_TABLET | Freq: Every day | ORAL | 3 refills | Status: DC

## 2018-03-15 NOTE — Telephone Encounter (Signed)
Pts wife Izora Gala called stating the pt is handling donepezil (ARICEPT) 5 MG tablet well. Requesting a call to discuss continuing meds.

## 2018-03-15 NOTE — Telephone Encounter (Signed)
Called and spoke with wife. He is tolerating Aricept 5mg  tab ok, no SE. Would like new rx sent for 10mg  tab. Verified pharmacy on file. They will call back if he does not tolerate increased dose.

## 2018-03-23 DIAGNOSIS — Z23 Encounter for immunization: Secondary | ICD-10-CM | POA: Diagnosis not present

## 2018-04-06 DIAGNOSIS — N401 Enlarged prostate with lower urinary tract symptoms: Secondary | ICD-10-CM | POA: Diagnosis not present

## 2018-04-06 DIAGNOSIS — R351 Nocturia: Secondary | ICD-10-CM | POA: Diagnosis not present

## 2018-06-11 ENCOUNTER — Other Ambulatory Visit: Payer: Self-pay | Admitting: Cardiology

## 2018-07-03 DIAGNOSIS — H5703 Miosis: Secondary | ICD-10-CM | POA: Diagnosis not present

## 2018-07-03 DIAGNOSIS — H2513 Age-related nuclear cataract, bilateral: Secondary | ICD-10-CM | POA: Diagnosis not present

## 2018-07-26 ENCOUNTER — Other Ambulatory Visit: Payer: Self-pay

## 2018-07-26 DIAGNOSIS — I712 Thoracic aortic aneurysm, without rupture, unspecified: Secondary | ICD-10-CM

## 2018-07-26 DIAGNOSIS — I714 Abdominal aortic aneurysm, without rupture, unspecified: Secondary | ICD-10-CM

## 2018-08-28 ENCOUNTER — Other Ambulatory Visit: Payer: Self-pay

## 2018-08-28 ENCOUNTER — Telehealth: Payer: Self-pay | Admitting: *Deleted

## 2018-08-28 ENCOUNTER — Ambulatory Visit: Payer: Medicare Other | Admitting: Vascular Surgery

## 2018-08-28 ENCOUNTER — Encounter: Payer: Self-pay | Admitting: Vascular Surgery

## 2018-08-28 ENCOUNTER — Ambulatory Visit
Admission: RE | Admit: 2018-08-28 | Discharge: 2018-08-28 | Disposition: A | Discharge status: Home / Self Care | Payer: Medicare Other | Source: Ambulatory Visit | Attending: Vascular Surgery | Admitting: Vascular Surgery

## 2018-08-28 VITALS — BP 130/68 | HR 53 | Temp 97.5°F | Ht 67.5 in | Wt 162.7 lb

## 2018-08-28 DIAGNOSIS — I714 Abdominal aortic aneurysm, without rupture, unspecified: Secondary | ICD-10-CM

## 2018-08-28 DIAGNOSIS — I712 Thoracic aortic aneurysm, without rupture, unspecified: Secondary | ICD-10-CM

## 2018-08-28 MED ORDER — IOPAMIDOL (ISOVUE-370) INJECTION 76%
75.0000 mL | Freq: Once | INTRAVENOUS | Status: AC | PRN
  Administered 2018-08-28: 75 mL via INTRAVENOUS

## 2018-08-28 NOTE — Telephone Encounter (Signed)
Request for Surgical Clearance  1. What type of surgery is being performed?  TEVAR    2. When is this surgery scheduled? PENDING FIRST OF  June 2020  3. What type of clearance is required (medical clearance vs. Pharmacy clearance to hold med vs. Both)? CARDIAC CLEARANCE AND PHARMACY CLEARANCE FOR SURGERY.     4. Are there any medications that need to be held prior to surgery and how long?  PLAVIX 5 DAY HOLD PRE-OP    5. Practice name and name of physician performing surgery?   [facility]/ DR. TODD EARY    6.  What is your office phone number? [phone]    7. What is your office fax number? (Be sure to include anyone who it needs to go Attn to) [phone] ATTN. [email or web address]    8. Anesthesia type (None, local, MAC, general)? GENERAL   REMINDER TO USER: Remember to please route this message to P CV DIV PREOP in a phone note.

## 2018-08-28 NOTE — Progress Notes (Signed)
[facility]  Patient name: Charles Terry MRN: [id number] DOB: Apr 07, 1928 Sex: male  REASON FOR VISIT: Follow-up thoracic aneurysm  HPI: Charles Terry is a 85 y.o. male here today for follow-up of his asymptomatic thoracic aneurysm.  He is status post open abdominal aortic aneurysm repair by myself in the remote past.  He has a known asymptomatic thoracic aneurysm and is here today for CT follow-up.  He remains quite active and healthy at his age of 8.  No recent cardiac issues.  Past Medical History:  Diagnosis Date  . BPH (benign prostatic hypertrophy) with urinary obstruction 11/12   surg  . Coronary artery disease involving native coronary artery 2005   100% LAD, 90% proximal circumflex followed by 80% OM1, 100% RCA. Right PDA and left DL in a codominant system --> CABG '05. low risk Nuc 5/12  . Dyslipidemia   . Essential hypertension   . H/O transfusion of packed red blood cells MAY 2012  . History of AAA (abdominal aortic aneurysm) repair 2006   AAA R&G Hemasheild 14 mm x 10 mm (Dr. Donnetta Hutching);; CT scan in 2013 shows 4.8 cm thoracic aortic aneurysm - the followed by Dr. Donnetta Hutching  . History of blood clots    right lower extremity dvt  after inguinal hernia surgery may 2012--doppler follow up report 04/07/11 at [facility] heart and vaxcular shows resolution of the dvt--but pt is to have IVC filter placed 05/05/11--prior to planned prostate surgery scheduled for 05/09/11.  [name] History of DVT of lower extremity    right lower extremity dvt  after inguinal hernia surgery may 2012--doppler follow up report 04/07/11 at [facility] heart and vaxcular shows resolution of the dvt--but pt is to have IVC filter placed 05/05/11--prior to planned prostate surgery scheduled for 05/09/11.  [name] History of hematuria   . Renal cyst may 2012   bleeding from right renal cyst /acute renal failure secondary to urinary retention from bph --pt  states  the cyst is no longer a problem--pt still has indwelling foley catheter--plans  open  suprapubic prostatectomy on 11/19 with dr. Gaynelle Arabian  . S/P CABG x 5 2005    LIMA-LAD, SeqSVG-OM1-OM2, Seq SVG- RPDA-LPL  . S/P insertion of IVC (inferior vena caval) filter 05/05/11   prior to prostate surgery    Family History  Problem Relation Age of Onset  . Aneurysm Mother        Died at age 58  . Aneurysm Father   . Lung cancer Father        Died at age 6  . Dementia Sister   . Prostate cancer Brother   . Heart attack Brother 77  . Dementia Brother   . Alzheimer's disease Brother     SOCIAL HISTORY: Social History   Tobacco Use  . Smoking status: Former Smoker    Packs/day: 1.00    Years: 20.00    Pack years: 20.00    Last attempt to quit: 12/17/1965    Years since quitting: 52.7  . Smokeless tobacco: Never Used  Substance Use Topics  . Alcohol use: Yes    Alcohol/week: 2.0 - 3.0 standard drinks    Types: 2 - 3 Standard drinks or equivalent per week    Comment: OCCAS    Allergies  Allergen Reactions  . Food Anaphylaxis    NO KIWI FRUIT   . Aspirin Rash and Other (See Comments)    ANGIOEDEMA    Current Outpatient Medications  Medication  Sig Dispense Refill  . Calcium Carbonate (CALTRATE 600 PO) Take 600 mg by mouth 2 (two) times daily.     . Cholecalciferol (D3-1000 PO) Take 1,000 Units by mouth daily.     . clopidogrel (PLAVIX) 75 MG tablet Take 1 tablet (75 mg total) by mouth every other day. 90 tablet 3  . donepezil (ARICEPT) 10 MG tablet Take 1 tablet (10 mg total) by mouth at bedtime. 90 tablet 3  . fish oil-omega-3 fatty acids 1000 MG capsule Take 1 g by mouth daily.    [name] losartan (COZAAR) 100 MG tablet TAKE 1/2 (ONE-HALF) TABLET BY MOUTH ONCE DAILY 45 tablet 0  . metoprolol tartrate (LOPRESSOR) 25 MG tablet Take 12.5 mg by mouth 2 (two) times daily.     . Multiple Vitamins-Minerals (MULTIVITAMINS THER. W/MINERALS) TABS Take 1 tablet by mouth daily.     .  simvastatin (ZOCOR) 40 MG tablet Take 20 mg by mouth at bedtime.     . tamsulosin (FLOMAX) 0.4 MG CAPS capsule Take 0.4 mg by mouth daily after supper.      No current facility-administered medications for this visit.     REVIEW OF SYSTEMS:  [X]  denotes positive finding, [ ]  denotes negative finding Cardiac  Comments:  Chest pain or chest pressure:    Shortness of breath upon exertion:    Short of breath when lying flat:    Irregular heart rhythm:        Vascular    Pain in calf, thigh, or hip brought on by ambulation:    Pain in feet at night that wakes you up from your sleep:     Blood clot in your veins:    Leg swelling:           PHYSICAL EXAM: Vitals:   08/28/18 1401  BP: 130/68  Pulse: (!) 53  Temp: (!) 97.5 F (36.4 C)  TempSrc: Oral  SpO2: 100%  Weight: 162 lb 11.2 oz (73.8 kg)  Height: 5' 7.5" (1.715 m)    GENERAL: The patient is a well-nourished male, in no acute distress. The vital signs are documented above. CARDIOVASCULAR: 2+ radial and 2+ femoral pulses bilaterally.  Carotid arteries without bruits bilaterally. PULMONARY: There is good air exchange  MUSCULOSKELETAL: There are no major deformities or cyanosis. NEUROLOGIC: No focal weakness or paresthesias are detected. SKIN: There are no ulcers or rashes noted. PSYCHIATRIC: The patient has a normal affect.  DATA:  CT scan of his chest abdomen and pelvis was reviewed.  This does show continued progression in size of his thoracic aneurysm.  It is now 6 cm in diameter.  The aneurysm begins distal to the left subclavian takeoff and ends well above the celiac artery takeoff.  MEDICAL ISSUES: Had a very long discussion with the patient and his wife present.  He has had progressive enlargement of his aneurysm approximately half a centimeter in a year.  It is now 6 cm in diameter.  He does appear to be an excellent candidate for stent grafting.  I have recommended that we have him see Dr. Ellyn Hack for cardiac risk  stratification.  Assuming he is not felt to be at a high risk I would recommend thoracic stent graft repair.  Discussed the procedure and with an expected one night hospitalization.  Does have adequate iliac artery lumen size for graft deployment.  We will discuss this further following cardiac evaluation.  He does have a trip planned with multiple generations of family in May and  I have suggested that we defer treatment until after his vacation if decision is to proceed.    Rosetta Posner, MD FACS [facility] Tel [phone] Pager [phone]

## 2018-09-06 ENCOUNTER — Ambulatory Visit: Payer: Medicare Other | Admitting: Neurology

## 2018-09-10 ENCOUNTER — Other Ambulatory Visit: Payer: Self-pay | Admitting: Cardiology

## 2018-09-24 ENCOUNTER — Ambulatory Visit: Payer: Medicare Other | Admitting: Cardiology

## 2018-10-15 ENCOUNTER — Telehealth: Payer: Self-pay | Admitting: *Deleted

## 2018-10-15 NOTE — Telephone Encounter (Signed)
Left message to call back - would like to schedule virtual visit for 10/24/18 with dr Ellyn Hack

## 2018-10-23 ENCOUNTER — Telehealth: Payer: Self-pay | Admitting: Cardiology

## 2018-10-24 ENCOUNTER — Telehealth (INDEPENDENT_AMBULATORY_CARE_PROVIDER_SITE_OTHER): Payer: Medicare Other | Admitting: Cardiology

## 2018-10-24 ENCOUNTER — Telehealth: Payer: Self-pay | Admitting: *Deleted

## 2018-10-24 ENCOUNTER — Encounter: Payer: Self-pay | Admitting: Cardiology

## 2018-10-24 VITALS — Ht 67.5 in | Wt 156.0 lb

## 2018-10-24 DIAGNOSIS — I712 Thoracic aortic aneurysm, without rupture, unspecified: Secondary | ICD-10-CM | POA: Insufficient documentation

## 2018-10-24 DIAGNOSIS — I10 Essential (primary) hypertension: Secondary | ICD-10-CM

## 2018-10-24 DIAGNOSIS — Z951 Presence of aortocoronary bypass graft: Secondary | ICD-10-CM

## 2018-10-24 DIAGNOSIS — Z01810 Encounter for preprocedural cardiovascular examination: Secondary | ICD-10-CM | POA: Diagnosis not present

## 2018-10-24 DIAGNOSIS — I2510 Atherosclerotic heart disease of native coronary artery without angina pectoris: Secondary | ICD-10-CM

## 2018-10-24 DIAGNOSIS — E785 Hyperlipidemia, unspecified: Secondary | ICD-10-CM

## 2018-10-24 NOTE — Telephone Encounter (Addendum)
Spoke to patient 's wife - instruction given from televisit with dr harding 10/24/18. avs summary will be mailed

## 2018-10-24 NOTE — Patient Instructions (Addendum)
Low Risk Patient for Low Risk Procedure  Medication Instructions:   No change  If you need a refill on your cardiac medications before your next appointment, please call your pharmacy.   Lab work:  Will look forward to getting labs from Dr.   If you have labs (blood work) drawn today and your tests are completely normal, you will receive your results only by: [name] MyChart Message (if you have MyChart) OR . A paper copy in the mail If you have any lab test that is abnormal or we need to change your treatment, we will call you to review the results.  Testing/Procedures:  No need for additional studies  Follow-Up: At [facility], you and your health needs are our priority.  As part of our continuing mission to provide you with exceptional heart care, we have created designated Provider Care Teams.  These Care Teams include your primary Cardiologist (physician) and Advanced Practice Providers (APPs -  Physician Assistants and Nurse Practitioners) who all work together to provide you with the care you need, when you need it. You will need a follow up appointment in  ~4-6    months.  Please call our office 2 months in advance to schedule this appointment.  You may see DAVID HARDING,MD  or one of the following Advanced Practice Providers on your designated Care Team:   Rosaria Ferries, PA-C . Jory Sims, DNP, ANP  Any Other Special Instructions Will Be Listed Below (If Applicable).

## 2018-10-24 NOTE — Progress Notes (Signed)
Virtual Visit via Telephone Note   This visit type was conducted due to national recommendations for restrictions regarding the COVID-19 Pandemic (e.g. social distancing) in an effort to limit this patient's exposure and mitigate transmission in our community.  Due to his co-morbid illnesses, this patient is at least at moderate risk for complications without adequate follow up.  This format is felt to be most appropriate for this patient at this time.  The patient did not have access to video technology/had technical difficulties with video requiring transitioning to audio format only (telephone).  All issues noted in this document were discussed and addressed.  No physical exam could be performed with this format.  Please refer to the patient's chart for his  consent to telehealth for [facility].   Patient does not have video technology available.  Patient has given verbal permission to conduct this visit via virtual appointment and to bill insurance 10/24/2018 12:10 PM     Evaluation Performed:  Follow-up visit  Date:  10/24/2018   ID:  Charles Terry, DOB 1928-03-31, MRN [id number]  Patient Location: Home Provider Location: Home  PCP:  Deland Pretty, MD  Cardiologist:  Glenetta Hew, MD  Electrophysiologist:  None   Chief Complaint: Early annual follow-up-CAD/CABG AAA, TAA  History of Present Illness:    Charles Terry is a 85 y.o. male with PMH notable for CAD-CABG. AAA (s/p repair) & TAA who presents via audio/video conferencing for a telehealth visit today - for early annual f/u.  Charles Terry was last seen in July 2019.  He was doing well.  He was asymptomatic in the past and we did not check a Myoview in 2017.  Per his request, we decided not to do any further testing. -Remains on Plavix, low-dose beta-blocker and ARB along with simvastatin.  Interval History:  Charles Terry continues to do very well.  He still does his daily 1 mile walk without fail.  Also doing well  with diet - has lost some weight.  No issues with fatigue. No cardiac issues.  Cardiovascular ROS: no chest pain or dyspnea on exertion negative for - edema, irregular heartbeat, loss of consciousness, orthopnea, palpitations, paroxysmal nocturnal dyspnea, rapid heart rate or shortness of breath; no Melena, hematochezia, hematuria or epistaxis.  Dr. Donnetta Hutching is continuing to follow TAA -- descending Thoracic Aorta up from 5.6 to 6 cm in 2019.Charles Terry  AAA is stable. -- Planning TEVAR in the near future.  The patient does not have symptoms concerning for COVID-19 infection (fever, chills, cough, or new shortness of breath).  The patient is practicing social distancing.   Upset b/c having to delay/cancel family trip to the beach.  ROS:  Please see the history of present illness.    Review of Systems  Musculoskeletal: Positive for joint pain (Normal arthritis pain).  Psychiatric/Behavioral: Negative for memory loss (Has not really been having much issues with this.  Wife helps him keep up with things.).   -arthritis pain.  Memory loss.  Past Medical History:  Diagnosis Date  . BPH (benign prostatic hypertrophy) with urinary obstruction 11/12   surg  . Coronary artery disease involving native coronary artery 2005   100% LAD, 90% proximal circumflex followed by 80% OM1, 100% RCA. Right PDA and left DL in a codominant system --> CABG '05. low risk Nuc 5/12  . Dyslipidemia   . Essential hypertension   . H/O transfusion of packed red blood cells MAY 2012  . History of AAA (abdominal aortic  aneurysm) repair 2006   AAA R&G Hemasheild 14 mm x 10 mm (Dr. Donnetta Hutching);; CT scan in 2013 shows 4.8 cm thoracic aortic aneurysm - the followed by Dr. Donnetta Hutching  . History of blood clots    right lower extremity dvt  after inguinal hernia surgery may 2012--doppler follow up report 04/07/11 at [facility] heart and vaxcular shows resolution of the dvt--but pt is to have IVC filter placed 05/05/11--prior to planned prostate  surgery scheduled for 05/09/11.  Charles Terry History of DVT of lower extremity    right lower extremity dvt  after inguinal hernia surgery may 2012--doppler follow up report 04/07/11 at [facility] heart and vaxcular shows resolution of the dvt--but pt is to have IVC filter placed 05/05/11--prior to planned prostate surgery scheduled for 05/09/11.  Charles Terry History of hematuria   . Renal cyst may 2012   bleeding from right renal cyst /acute renal failure secondary to urinary retention from bph --pt states  the cyst is no longer a problem--pt still has indwelling foley catheter--plans  open  suprapubic prostatectomy on 11/19 with dr. Gaynelle Arabian  . S/P CABG x 5 2005    LIMA-LAD, SeqSVG-OM1-OM2, Seq SVG- RPDA-LPL  . S/P insertion of IVC (inferior vena caval) filter 05/05/11   prior to prostate surgery   Past Surgical History:  Procedure Laterality Date  . 2D ECHOCARDIOGRAM  11/02/2010; June 2017   a. EF 45-50%, w/ anterior hypokinesis, otherwise normal; mildly sclerotic aortic valve;; b.EF 60-65%. No regional wall motion abnormality. Grade 1 diastolic dysfunction. Mildly reduced RV function. Mild elevation of pulmonary pressures   . AAA RPAIR  03/29/2005   14x56mm Hemashield graft  . CARDIAC CATHETERIZATION  12/23/2003   Recommended CABG; 100% LAD, 90% proximal circumflex followed by 80% OM1, 100% RCA. Right PDA and left DL in a codominant system.  . CORONARY ARTERY BYPASS GRAFT  12/25/2003   x5, LIMA-LAD, SeqSVG-OM1-OM2, Seq SVG- RPDA-LPL  . HERNIA REPAIR Left    MAY 2012  . IVC  11/12   placed prior to prostate surg  . PERSANTINE MYOVIEW  10/22/2010   Normal perfusion  . PROSTATECTOMY  05/09/2011   Procedure: PROSTATECTOMY SUPRAPUBIC;  Surgeon: Ailene Rud, MD;  Location: [facility];  Service: Urology;  Laterality: N/A;  Open  . TRANSURETHRAL RESECTION OF PROSTATE N/A 12/24/2015   Procedure: TRANSURETHRAL RESECTION OF THE PROSTATE (TURP);  Surgeon: Carolan Clines, MD;  Location: [facility];  Service:  Urology;  Laterality: N/A;  . VENOUS DOPPLER  04/04/2011   No evidence of thrombus or thrombophlebitis     Current Meds  Medication Sig  . Calcium Carbonate (CALTRATE 600 PO) Take 600 mg by mouth 2 (two) times daily.   . clopidogrel (PLAVIX) 75 MG tablet Take 1 tablet (75 mg total) by mouth every other day.  . donepezil (ARICEPT) 10 MG tablet Take 1 tablet (10 mg total) by mouth at bedtime.  . fish oil-omega-3 fatty acids 1000 MG capsule Take 1 g by mouth daily.  Charles Terry losartan (COZAAR) 100 MG tablet Take 1/2 (one-half) tablet by mouth once daily  . metoprolol tartrate (LOPRESSOR) 25 MG tablet Take 12.5 mg by mouth 2 (two) times daily.   . Multiple Vitamins-Minerals (MULTIVITAMINS THER. W/MINERALS) TABS Take 1 tablet by mouth daily.   . simvastatin (ZOCOR) 40 MG tablet Take 20 mg by mouth at bedtime.   . tamsulosin (FLOMAX) 0.4 MG CAPS capsule Take 0.4 mg by mouth daily after supper.      Allergies:   Food; Other; and Aspirin  Social History   Tobacco Use  . Smoking status: Former Smoker    Packs/day: 1.00    Years: 20.00    Pack years: 20.00    Last attempt to quit: 12/17/1965    Years since quitting: 52.8  . Smokeless tobacco: Never Used  Substance Use Topics  . Alcohol use: Yes    Alcohol/week: 2.0 - 3.0 standard drinks    Types: 2 - 3 Standard drinks or equivalent per week    Comment: OCCAS  . Drug use: No     Family Hx: The patient's family history includes Alzheimer's disease in his brother; Aneurysm in his father and mother; Dementia in his brother and sister; Heart attack (age of onset: 38) in his brother; Lung cancer in his father; Prostate cancer in his brother.   Prior CV studies:   The following studies were reviewed today: . None:  Labs/Other Tests and Data Reviewed:    EKG:  No ECG reviewed.  Recent Labs: No results found for requested labs within last 8760 hours.   Recent Lipid Panel:  PCP checked (~ 1 yr ago).   Lab Results  Component Value  Date/Time   CHOL 83 11/20/2015 04:40 AM   TRIG 72 11/20/2015 04:40 AM   HDL 31 (L) 11/20/2015 04:40 AM   CHOLHDL 2.7 11/20/2015 04:40 AM   LDLCALC 38 11/20/2015 04:40 AM    Wt Readings from Last 3 Encounters:  10/24/18 156 lb (70.8 kg)  08/28/18 162 lb 11.2 oz (73.8 kg)  12/18/17 171 lb 6.4 oz (77.7 kg)     Objective:    Vital Signs:  Ht 5' 7.5" (1.715 m)   Wt 156 lb (70.8 kg)   BMI 24.07 kg/m  ; BP has been doing well.  VITAL SIGNS:  reviewed GEN:  no acute distress RESPIRATORY:  Nonlabored sounding, no wheezing or cough NEURO:  Alert and oriented x3. PSYCH:  Normal mood and affect.   ASSESSMENT & PLAN:    Problem List Items Addressed This Visit    Atherosclerosis of native coronary artery without angina pectoris (Chronic)   Dyslipidemia, goal LDL below 70 (Chronic)   Essential hypertension (Chronic)   Preoperative cardiovascular examination - Primary   S/P CABG x 5 (Chronic)   Thoracic aortic aneurysm without rupture (HCC) (Chronic)     Denys is doing very well from a cardiac standpoint.  He is continue to stay active and not having any anginal symptoms.  No heart failure symptoms.  He has a growing thoracic aortic aneurysm which is in need of endovascular repair (TEVAR) by Dr. Curt Jews.  I am visiting with him via telehealth medicine for PREOPERATIVE RISK ASSESSMENT.  PREOPERATIVE CARDIAC RISK ASSESSMENT   Revised Cardiac Risk Index:  High Risk Surgery: no; current management at this time is relatively low risk.  Obviously if he goes open would be considered high risk.  Defined as Intraperitoneal, intrathoracic or suprainguinal vascular  Active CAD: no; no active angina symptoms  CHF: no; despite having mildly reduced EF, has not had any heart failure symptoms.  Cerebrovascular Disease: no;   Diabetes: no; On Insulin: no  CKD (Cr >~ 2): no;   Total: 0 Estimated Risk of Adverse Outcome: Simply given his age, he would be at some increased risk;  however would still be considered to be a LOW RISK PATIENT FOR LOW RISK PROCEDURE Estimated Risk of MI, PE, VF/VT (Cardiac Arrest), Complete Heart Block: Conservative estimate 1 to 3% -albeit somewhat reduced with him being  on stable beta-blocker dose.   ACC/AHA Guidelines for "Clearance":  Step 1 - Need for Emergency Surgery: No: Not emergent, but is still relatively urgent  If Yes - go straight to OR with perioperative surveillance  Step 2 - Active Cardiac Conditions (Unstable Angina, Decompensated HF, Significant  Arrhytmias - Complete HB, Mobitz II, Symptomatic VT or SVT, Severe Aortic Stenosis - mean gradient > 40 mmHg, Valve area < 1.0 cm2):   No: No active symptoms  If Yes - Evaluate & Treat per ACC/AHA Guidelines  Step 3 -  Low Risk Surgery: Yes -if done endovascular  If Yes --> proceed to OR  If No --> Step 4  Step 4 - Functional Capacity >= 4 METS without symptoms: Yes  If Yes --> proceed to OR  If No --> Step 5  Step 5 --  Clinical Risk Factors (CRF)  -see above, no risk factors listed.  3 or more: No: Noted above  If Yes -- assess Surgical Risk, --   (High Risk Non-cardiac), Intraabdominal or thoracic vascular surgery consider testing if it will change management.  Intermediate Risk: Proceed to OR with HR control, or consider testing if it will change management  No CRFs: Yes  If Yes --> Proceed to OR  Recommendations would be to proceed with TEVAR without any further cardiac evaluation.  Lack of symptoms would mean that further testing would not change management going forward.  He has been stable.  We have not checked a stress test since 2012 because lack of symptoms and patient request.  From a cardiovascular standpoint, he is on stable medications including low-dose ARB, beta-blocker, statin as well as clopidogrel.  No changes.  Blood pressure has been well controlled ;  PCP is following her lipids which is been well controlled  COVID-19 Education:  The signs and symptoms of COVID-19 were discussed with the patient and how to seek care for testing (follow up with PCP or arrange E-visit).   The importance of social distancing was discussed today.  Time:   Today, I have spent 25 minutes with the patient with telehealth technology discussing the above problems.     Medication Adjustments/Labs and Tests Ordered: Current medicines are reviewed at length with the patient today.  Concerns regarding medicines are outlined above.  Medication Instructions:   No change  Tests Ordered: No orders of the defined types were placed in this encounter.  No need for additional studies prior to TEVAR  Medication Changes: No orders of the defined types were placed in this encounter.   Disposition:  Follow up in 6 month(s)    Signed, Glenetta Hew, MD  10/24/2018 12:10 PM    [place]

## 2018-11-01 ENCOUNTER — Other Ambulatory Visit: Payer: Self-pay | Admitting: *Deleted

## 2018-11-01 NOTE — Progress Notes (Signed)
Phone call to wife and patient. Surgery scheduled for 11/30/2018 patient's choice. Instructed to be at [facility] admitting at 5:30 am. NPO past MN night prior. Hold Plavis x 5 days prior to this surgery. Expect a call and follow the detailed surgery instructions and drive-thru COVID screening instructions received from the hospital pre-admission department. Reviewed visitor restrictions. Repeated back and verbalized understanding.

## 2018-11-07 ENCOUNTER — Encounter: Payer: Self-pay | Admitting: Vascular Surgery

## 2018-11-26 NOTE — Pre-Procedure Instructions (Signed)
Charles Terry  11/26/2018      [place], [place] [address] [place] [address] Phone: [phone] Fax: [phone]    Your procedure is scheduled on Fri., November 30, 2018 from 7:30AM-9:32AM  Report to [facility] Entrance "A" at 5:30AM  Call this number if you have problems the morning of surgery:  [phone]   Remember:  Do not eat or drink after midnight on June 11th    Take these medicines the morning of surgery with A SIP OF WATER: Metoprolol tartrate (LOPRESSOR)   Take last dose of Plavix on 6/7  As of today, stop taking all Other Aspirin Products, Vitamins, Fish oils, and Herbal medications. Also stop all NSAIDS i.e. Advil, Ibuprofen, Motrin, Aleve, Anaprox, Naproxen, BC, Goody Powders, and all Supplements.    Do not wear jewelry.  Do not wear lotions, powders, colognes, or deodorant.  Do not shave 48 hours prior to surgery.  Men may shave face.  Do not bring valuables to the hospital.  [facility] is not responsible for any belongings or valuables.  Contacts, dentures or bridgework may not be worn into surgery.    For patients admitted to the hospital, discharge time will be determined by your treatment team.  Patients discharged the day of surgery will not be allowed to drive home.   Special instructions:   [facility]- Preparing For Surgery  Before surgery, you can play an important role. Because skin is not sterile, your skin needs to be as free of germs as possible. You can reduce the number of germs on your skin by washing with CHG (chlorahexidine gluconate) Soap before surgery.  CHG is an antiseptic cleaner which kills germs and bonds with the skin to continue killing germs even after washing.    Oral Hygiene is also important to reduce your risk of infection.  Remember - BRUSH YOUR TEETH THE MORNING OF SURGERY WITH YOUR REGULAR TOOTHPASTE  Please do not use if you have an allergy  to CHG or antibacterial soaps. If your skin becomes reddened/irritated stop using the CHG.  Do not shave (including legs and underarms) for at least 48 hours prior to first CHG shower. It is OK to shave your face.  Please follow these instructions carefully.   1. Shower the NIGHT BEFORE SURGERY and the MORNING OF SURGERY with CHG.   2. If you chose to wash your hair, wash your hair first as usual with your normal shampoo.  3. After you shampoo, rinse your hair and body thoroughly to remove the shampoo.  4. Use CHG as you would any other liquid soap. You can apply CHG directly to the skin and wash gently with a scrungie or a clean washcloth.   5. Apply the CHG Soap to your body ONLY FROM THE NECK DOWN.  Do not use on open wounds or open sores. Avoid contact with your eyes, ears, mouth and genitals (private parts). Wash Face and genitals (private parts)  with your normal soap.  6. Wash thoroughly, paying special attention to the area where your surgery will be performed.  7. Thoroughly rinse your body with warm water from the neck down.  8. DO NOT shower/wash with your normal soap after using and rinsing off the CHG Soap.  9. Pat yourself dry with a CLEAN TOWEL.  10. Wear CLEAN PAJAMAS to bed the night before surgery, wear comfortable clothes the morning of surgery  11. Place CLEAN SHEETS  on your bed the night of your first shower and DO NOT SLEEP WITH PETS.  Day of Surgery:  Do not apply any deodorants/lotions.  Please wear clean clothes to the hospital/surgery center.   Remember to brush your teeth WITH YOUR REGULAR TOOTHPASTE.  Please read over the following fact sheets that you were given. Pain Booklet, Coughing and Deep Breathing, MRSA Information and Surgical Site Infection Prevention

## 2018-11-27 ENCOUNTER — Other Ambulatory Visit: Payer: Self-pay

## 2018-11-27 ENCOUNTER — Encounter (HOSPITAL_COMMUNITY)
Admission: RE | Admit: 2018-11-27 | Discharge: 2018-11-27 | Disposition: A | Discharge status: Home / Self Care | Payer: Medicare Other | Source: Ambulatory Visit | Attending: Vascular Surgery | Admitting: Vascular Surgery

## 2018-11-27 ENCOUNTER — Other Ambulatory Visit (HOSPITAL_COMMUNITY)
Admission: RE | Admit: 2018-11-27 | Discharge: 2018-11-27 | Disposition: A | Discharge status: Home / Self Care | Payer: Medicare Other | Source: Ambulatory Visit | Attending: Vascular Surgery | Admitting: Vascular Surgery

## 2018-11-27 ENCOUNTER — Encounter (HOSPITAL_COMMUNITY): Payer: Self-pay

## 2018-11-27 DIAGNOSIS — Z1159 Encounter for screening for other viral diseases: Secondary | ICD-10-CM | POA: Insufficient documentation

## 2018-11-27 DIAGNOSIS — I10 Essential (primary) hypertension: Secondary | ICD-10-CM | POA: Insufficient documentation

## 2018-11-27 DIAGNOSIS — Z01812 Encounter for preprocedural laboratory examination: Secondary | ICD-10-CM | POA: Insufficient documentation

## 2018-11-27 DIAGNOSIS — Z951 Presence of aortocoronary bypass graft: Secondary | ICD-10-CM | POA: Diagnosis not present

## 2018-11-27 DIAGNOSIS — I712 Thoracic aortic aneurysm, without rupture: Secondary | ICD-10-CM | POA: Diagnosis not present

## 2018-11-27 DIAGNOSIS — I25810 Atherosclerosis of coronary artery bypass graft(s) without angina pectoris: Secondary | ICD-10-CM | POA: Diagnosis not present

## 2018-11-27 DIAGNOSIS — E785 Hyperlipidemia, unspecified: Secondary | ICD-10-CM | POA: Diagnosis not present

## 2018-11-27 DIAGNOSIS — Z87891 Personal history of nicotine dependence: Secondary | ICD-10-CM | POA: Insufficient documentation

## 2018-11-27 DIAGNOSIS — Z79899 Other long term (current) drug therapy: Secondary | ICD-10-CM | POA: Diagnosis not present

## 2018-11-27 HISTORY — DX: Unspecified osteoarthritis, unspecified site: M19.90

## 2018-11-27 HISTORY — DX: Unspecified dementia, unspecified severity, without behavioral disturbance, psychotic disturbance, mood disturbance, and anxiety: F03.90

## 2018-11-27 LAB — COMPREHENSIVE METABOLIC PANEL
ALT: 19 U/L (ref 0–44)
AST: 21 U/L (ref 15–41)
Albumin: 3.6 g/dL (ref 3.5–5.0)
Alkaline Phosphatase: 47 U/L (ref 38–126)
Anion gap: 7 (ref 5–15)
BUN: 17 mg/dL (ref 8–23)
CO2: 27 mmol/L (ref 22–32)
Calcium: 9.3 mg/dL (ref 8.9–10.3)
Chloride: 104 mmol/L (ref 98–111)
Creatinine, Ser: 0.99 mg/dL (ref 0.61–1.24)
GFR calc Af Amer: >60 mL/min (ref >60)
GFR calc non Af Amer: >60 mL/min (ref >60)
Glucose, Bld: 135 mg/dL — ABNORMAL HIGH (ref 70–99)
Potassium: 4 mmol/L (ref 3.5–5.1)
Sodium: 138 mmol/L (ref 135–145)
Total Bilirubin: 0.7 mg/dL (ref 0.3–1.2)
Total Protein: 8.4 g/dL — ABNORMAL HIGH (ref 6.5–8.1)

## 2018-11-27 LAB — URINALYSIS, ROUTINE W REFLEX MICROSCOPIC
Bilirubin Urine: NEGATIVE
Glucose, UA: NEGATIVE mg/dL
Hgb urine dipstick: NEGATIVE
Ketones, ur: NEGATIVE mg/dL
Leukocytes,Ua: NEGATIVE
Nitrite: NEGATIVE
Protein, ur: NEGATIVE mg/dL
Specific Gravity, Urine: 1.019 (ref 1.005–1.030)
pH: 5 (ref 5.0–8.0)

## 2018-11-27 LAB — APTT: aPTT: 33 seconds (ref 24–36)

## 2018-11-27 LAB — PROTIME-INR
INR: 1.1 (ref 0.8–1.2)
Prothrombin Time: 13.8 seconds (ref 11.4–15.2)

## 2018-11-27 LAB — CBC
HCT: 44.9 % (ref 39.0–52.0)
Hemoglobin: 14.7 g/dL (ref 13.0–17.0)
MCH: 31.7 pg (ref 26.0–34.0)
MCHC: 32.7 g/dL (ref 30.0–36.0)
MCV: 96.8 fL (ref 80.0–100.0)
Platelets: 150 10*3/uL (ref 150–400)
RBC: 4.64 MIL/uL (ref 4.22–5.81)
RDW: 13.9 % (ref 11.5–15.5)
WBC: 7.3 10*3/uL (ref 4.0–10.5)
nRBC: 0 % (ref 0.0–0.2)

## 2018-11-27 LAB — SURGICAL PCR SCREEN
MRSA, PCR: NEGATIVE
Staphylococcus aureus: NEGATIVE

## 2018-11-27 MED ORDER — CHLORHEXIDINE GLUCONATE 4 % EX LIQD: 60.0000 mL | Freq: Once | CUTANEOUS | Status: DC

## 2018-11-27 NOTE — Anesthesia Preprocedure Evaluation (Addendum)
Anesthesia Evaluation  Patient identified by MRN, date of birth, ID band Patient awake    Reviewed: Allergy & Precautions, NPO status , Patient's Chart, lab work & pertinent test results, reviewed documented beta blocker date and time   History of Anesthesia Complications Negative for: history of anesthetic complications  Airway Mallampati: II  TM Distance: >3 FB Neck ROM: Full    Dental  (+) Teeth Intact, Dental Advisory Given, Implants, Caps   Pulmonary neg pulmonary ROS, former smoker,    Pulmonary exam normal breath sounds clear to auscultation       Cardiovascular hypertension, Pt. on home beta blockers (-) angina+ CAD, + CABG, + Peripheral Vascular Disease and + DVT  Normal cardiovascular exam Rhythm:Regular Rate:Normal  CAD (s/p CABG: LIMA-LAD, SVG-OM1-OM2, SVG-RPDA-LPL 12/25/03), AAA (s/p open repair 06/29/04), descending TAA (6 cm), post-operative DVT (RLE 2012, IVC filter 05/05/11), HTN, dyslipidemia   Neuro/Psych PSYCHIATRIC DISORDERS Dementia negative neurological ROS     GI/Hepatic negative GI ROS, Neg liver ROS,   Endo/Other  negative endocrine ROS  Renal/GU Renal disease (Renal cyst)   Prostate cancer s/p prostatectomy    Musculoskeletal  (+) Arthritis ,   Abdominal   Peds  Hematology  (+) Blood dyscrasia (Plavix), ,   Anesthesia Other Findings Day of surgery medications reviewed with the patient.  Reproductive/Obstetrics                           Anesthesia Physical Anesthesia Plan  ASA: IV  Anesthesia Plan: General   Post-op Pain Management:    Induction: Intravenous  PONV Risk Score and Plan: 2 and Ondansetron, Dexamethasone and Treatment may vary due to age or medical condition  Airway Management Planned: Oral ETT  Additional Equipment: Arterial line  Intra-op Plan:   Post-operative Plan: Extubation in OR  Informed Consent: I have reviewed the patients  History and Physical, chart, labs and discussed the procedure including the risks, benefits and alternatives for the proposed anesthesia with the patient or authorized representative who has indicated his/her understanding and acceptance.     Dental advisory given  Plan Discussed with: CRNA  Anesthesia Plan Comments: (2 large bore PIVs. Consider CVL if no good peripheral access. )      Anesthesia Quick Evaluation

## 2018-11-27 NOTE — Progress Notes (Signed)
PCP - dr Shelia Media Cardiologist - Dr Ellyn Hack     Chest x-ray - none EKG - 7/19 Stress Test - na ECHO - 6/17 Cardiac Cath- 05  Sleep Study - none CPAP -na   Fasting Blood Sugar - na Checks Blood Sugar ___na__ times a day  Blood Thinner Instructions:plavix stopped 6/4 AAnesthesia review: clearance in epic    Heart HX  Patient denies shortness of breath, fever, cough and chest pain at PAT appointment   Patient verbalized understanding of instructions that were given to them at the PAT appointment. Patient was also instructed that they will need to review over the PAT instructions again at home before surgery.

## 2018-11-27 NOTE — Progress Notes (Signed)
Anesthesia Chart Review:  Case:  [id number] Date/Time:  11/30/18 0715   Procedure:  THORACIC AORTIC ENDOVASCULAR STENT GRAFT (N/A )   Anesthesia type:  General   Pre-op diagnosis:  THORACIC AORTIC ANEURYSM   Location:  [facility] OR ROOM 3 / [facility]   Surgeon:  Rosetta Posner, MD      DISCUSSION: Patient is a 85 year old male scheduled for the above procedure.  History includes former smoker (quit 1967), CAD (s/p CABG: LIMA-LAD, SVG-OM1-OM2, SVG-RPDA-LPL 12/25/03), AAA (s/p open repair 06/29/04), descending TAA (6 cm), post-operative DVT (RLE 2012, IVC filter 05/05/11), HTN, dyslipidemia, dementia, BPH (s/p open prostatectomy 04/29/11, s/p TURP 12/24/15), left inguinal hernia repair 11/12/10.  Patient last evaluated by cardiologist Dr. Ellyn Hack on 10/24/18. He is aware for plans for TEVAR.   Per [facility], patient told to hold Plavix x 5 days. Patient reported Plavix held starting 11/22/18. Presurgical COVID test in process. Based on currently available information, I would anticipate that he can proceed as planned if no acuate changes.    VS: BP 127/64   Pulse (!) 50   Temp (!) 36.2 C   Resp 20   Ht 5\' 7"  (1.702 m)   Wt 72.1 kg   SpO2 97%   BMI 24.90 kg/m   PROVIDERS: Deland Pretty, MD is PCP - Glenetta Hew, MD is cardiologist. No plans for further cardiac testing by 10/24/18 office note. Kathrynn Ducking, MD is neurologist. Seen for memory disturbance 02/15/18. Mini-Mental status examination score suggested a moderate level of dementia, but patient still functioning at a fairly high level at that time. Aricept started.    LABS: Labs reviewed: Acceptable for surgery. (all labs ordered are listed, but only abnormal results are displayed)  Labs Reviewed  COMPREHENSIVE METABOLIC PANEL - Abnormal; Notable for the following components:      Result Value   Glucose, Bld 135 (*)    Total Protein 8.4 (*)    All other components within normal limits  SURGICAL PCR SCREEN  APTT  CBC  PROTIME-INR  URINALYSIS,  ROUTINE W REFLEX MICROSCOPIC  TYPE AND SCREEN    IMAGES: CTA chest/abdomen/aorta 08/28/18: IMPRESSION: Chest CTA impression: 1. Continued increase in size of focal aneurysmal dilatation of the mid descending thoracic aorta measuring approximately 6.0 cm in maximal diameter, previously, 5.6 cm (when compared to the 07/2017 examination) and approximately 5.4 cm (when compared to the 04/2016 examination) with similar appearing mixed calcified and noncalcified mural thrombus. No thoracic aortic dissection or periaortic stranding. 2. Stable uncomplicated mild fusiform aneurysmal dilatation of the ascending thoracic aorta measuring 3. 42 mm in diameter, unchanged compared to the 04/2016 examination. Aortic aneurysm NOS (ICD10-I71.9). 4. Post median sternotomy and CABG with calcifications within the native coronary arteries. Aortic Atherosclerosis (ICD10-I70.0).  Abdomen and pelvic CTA impression: . Post open repair of infrarenal abdominal aorta without evidence of complication. 2. Aortic Atherosclerosis (ICD10-I70.0). 3. Cholelithiasis without evidence cholecystitis. 4. Prostatomegaly. 5. No change to slight increase in size of bilateral hypoattenuating though minimally enhancing renal lesions as detailed above. Further evaluation with dedicated contrast-enhanced renal MRI could be performed as indicated.    EKG: 12/18/17:  SB at 53 BPM. First degree AV Block. Low voltage QRS. Incomplete right BBB.   CV: Echo 11/23/15: Study Conclusions - Left ventricle: The cavity size was normal. There was mild   concentric hypertrophy. Systolic function was normal. The   estimated ejection fraction was in the range of 60% to 65%. Wall   motion was  normal; there were no regional wall motion   abnormalities. Doppler parameters are consistent with abnormal   left ventricular relaxation (grade 1 diastolic dysfunction).   There was no evidence of elevated ventricular filling pressure by   Doppler  parameters. - Aortic valve: Valve mobility was restricted. There was mild   regurgitation. - Aortic root: The aortic root was normal in size. - Mitral valve: Calcified annulus. Mildly thickened leaflets .   There was no regurgitation. - Left atrium: The atrium was normal in size. - Right ventricle: The cavity size was mildly dilated. Wall   thickness was normal. Systolic function was moderately reduced. - Right atrium: The atrium was normal in size. - Tricuspid valve: There was mild regurgitation. - Pulmonic valve: There was trivial regurgitation. - Pulmonary arteries: Systolic pressure was mildly increased. PA   peak pressure: 37 mm Hg (S). - Inferior vena cava: The vessel was normal in size. - Pericardium, extracardiac: There was no pericardial effusion.  Nuclear stress test 10/22/10: IMPRESSION: The post stress myocardial perfusion images show a normal pattern of perfusion in all regions with EF 70%. Global LV systolic function is normal. No significant wall motion abnormalities. Normal Myocardial Perfusion study. Low risk scan.   Last cardiac cath seen was on 12/23/03 prior to CABG.   Past Medical History:  Diagnosis Date  . Arthritis   . BPH (benign prostatic hypertrophy) with urinary obstruction 11/12   surg  . Coronary artery disease involving native coronary artery 2005   100% LAD, 90% proximal circumflex followed by 80% OM1, 100% RCA. Right PDA and left DL in a codominant system --> CABG '05. low risk Nuc 5/12  . Dementia (Charles Terry)   . Dyslipidemia   . Essential hypertension   . H/O transfusion of packed red blood cells MAY 2012  . History of AAA (abdominal aortic aneurysm) repair 2006   AAA R&G Hemasheild 14 mm x 10 mm (Dr. Donnetta Hutching);; CT scan in 2013 shows 4.8 cm thoracic aortic aneurysm - the followed by Dr. Donnetta Hutching  . History of blood clots    right lower extremity dvt  after inguinal hernia surgery may 2012--doppler follow up report 04/07/11 at [facility] heart and vaxcular  shows resolution of the dvt--but pt is to have IVC filter placed 05/05/11--prior to planned prostate surgery scheduled for 05/09/11.  [name] History of DVT of lower extremity    right lower extremity dvt  after inguinal hernia surgery may 2012--doppler follow up report 04/07/11 at [facility] heart and vaxcular shows resolution of the dvt--but pt is to have IVC filter placed 05/05/11--prior to planned prostate surgery scheduled for 05/09/11.  [name] History of hematuria   . Renal cyst may 2012   bleeding from right renal cyst /acute renal failure secondary to urinary retention from bph --pt states  the cyst is no longer a problem--pt still has indwelling foley catheter--plans  open  suprapubic prostatectomy on 11/19 with dr. Gaynelle Arabian  . S/P CABG x 5 2005    LIMA-LAD, SeqSVG-OM1-OM2, Seq SVG- RPDA-LPL  . S/P insertion of IVC (inferior vena caval) filter 05/05/11   prior to prostate surgery    Past Surgical History:  Procedure Laterality Date  . 2D ECHOCARDIOGRAM  11/02/2010; June 2017   a. EF 45-50%, w/ anterior hypokinesis, otherwise normal; mildly sclerotic aortic valve;; b.EF 60-65%. No regional wall motion abnormality. Grade 1 diastolic dysfunction. Mildly reduced RV function. Mild elevation of pulmonary pressures   . AAA RPAIR  03/29/2005   14x53mm Hemashield graft  .  CARDIAC CATHETERIZATION  12/23/2003   Recommended CABG; 100% LAD, 90% proximal circumflex followed by 80% OM1, 100% RCA. Right PDA and left DL in a codominant system.  . CORONARY ARTERY BYPASS GRAFT  12/25/2003   x5, LIMA-LAD, SeqSVG-OM1-OM2, Seq SVG- RPDA-LPL  . HERNIA REPAIR Left    MAY 2012  . IVC  11/12   placed prior to prostate surg  . PERSANTINE MYOVIEW  10/22/2010   Normal perfusion  . PROSTATECTOMY  05/09/2011   Procedure: PROSTATECTOMY SUPRAPUBIC;  Surgeon: Ailene Rud, MD;  Location: [facility] ORS;  Service: Urology;  Laterality: N/A;  Open  . TRANSURETHRAL RESECTION OF PROSTATE N/A 12/24/2015   Procedure: TRANSURETHRAL  RESECTION OF THE PROSTATE (TURP);  Surgeon: Carolan Clines, MD;  Location: [facility] ORS;  Service: Urology;  Laterality: N/A;  . VENOUS DOPPLER  04/04/2011   No evidence of thrombus or thrombophlebitis    MEDICATIONS: . Calcium Carbonate (CALTRATE 600 PO)  . clopidogrel (PLAVIX) 75 MG tablet  . donepezil (ARICEPT) 10 MG tablet  . fish oil-omega-3 fatty acids 1000 MG capsule  . losartan (COZAAR) 100 MG tablet  . metoprolol tartrate (LOPRESSOR) 25 MG tablet  . Multiple Vitamins-Minerals (MULTIVITAMINS THER. W/MINERALS) TABS  . simvastatin (ZOCOR) 40 MG tablet  . tamsulosin (FLOMAX) 0.4 MG CAPS capsule   . chlorhexidine (HIBICLENS) 4 % liquid 4 application   And  . [START ON 11/28/2018] chlorhexidine (HIBICLENS) 4 % liquid 4 application    Myra Gianotti, PA-C Surgical Short Stay/Anesthesiology [facility] Phone [phone] [facility] Phone [phone] 11/27/2018 1:08 PM

## 2018-11-28 LAB — NOVEL CORONAVIRUS, NAA (HOSP ORDER, SEND-OUT TO REF LAB; TAT 18-24 HRS): SARS-CoV-2, NAA: NOT DETECTED

## 2018-11-28 LAB — TYPE AND SCREEN
ABO/RH(D): AB POS
Antibody Screen: NEGATIVE

## 2018-11-30 ENCOUNTER — Inpatient Hospital Stay (HOSPITAL_COMMUNITY): Payer: Medicare Other | Admitting: Anesthesiology

## 2018-11-30 ENCOUNTER — Inpatient Hospital Stay (HOSPITAL_COMMUNITY)
Admission: RE | Admit: 2018-11-30 | Discharge: 2018-12-01 | DRG: 221 | Disposition: A | Discharge status: Home / Self Care | Payer: Medicare Other | Attending: Vascular Surgery | Admitting: Vascular Surgery

## 2018-11-30 ENCOUNTER — Other Ambulatory Visit (HOSPITAL_COMMUNITY): Payer: Self-pay | Admitting: *Deleted

## 2018-11-30 ENCOUNTER — Encounter (HOSPITAL_COMMUNITY): Payer: Self-pay

## 2018-11-30 ENCOUNTER — Other Ambulatory Visit: Payer: Self-pay

## 2018-11-30 ENCOUNTER — Inpatient Hospital Stay (HOSPITAL_COMMUNITY): Payer: Medicare Other | Admitting: Vascular Surgery

## 2018-11-30 ENCOUNTER — Encounter (HOSPITAL_COMMUNITY)
Admission: RE | Disposition: A | Discharge status: Home / Self Care | Payer: Self-pay | Source: Home / Self Care | Attending: Vascular Surgery

## 2018-11-30 ENCOUNTER — Inpatient Hospital Stay (HOSPITAL_COMMUNITY): Payer: Medicare Other

## 2018-11-30 DIAGNOSIS — E785 Hyperlipidemia, unspecified: Secondary | ICD-10-CM | POA: Diagnosis not present

## 2018-11-30 DIAGNOSIS — I358 Other nonrheumatic aortic valve disorders: Secondary | ICD-10-CM | POA: Diagnosis not present

## 2018-11-30 DIAGNOSIS — Z466 Encounter for fitting and adjustment of urinary device: Secondary | ICD-10-CM | POA: Diagnosis not present

## 2018-11-30 DIAGNOSIS — Z9079 Acquired absence of other genital organ(s): Secondary | ICD-10-CM

## 2018-11-30 DIAGNOSIS — I272 Pulmonary hypertension, unspecified: Secondary | ICD-10-CM | POA: Diagnosis not present

## 2018-11-30 DIAGNOSIS — Z8679 Personal history of other diseases of the circulatory system: Secondary | ICD-10-CM | POA: Diagnosis not present

## 2018-11-30 DIAGNOSIS — Z886 Allergy status to analgesic agent status: Secondary | ICD-10-CM | POA: Diagnosis not present

## 2018-11-30 DIAGNOSIS — Z8042 Family history of malignant neoplasm of prostate: Secondary | ICD-10-CM | POA: Diagnosis not present

## 2018-11-30 DIAGNOSIS — Z8249 Family history of ischemic heart disease and other diseases of the circulatory system: Secondary | ICD-10-CM | POA: Diagnosis not present

## 2018-11-30 DIAGNOSIS — Z7902 Long term (current) use of antithrombotics/antiplatelets: Secondary | ICD-10-CM

## 2018-11-30 DIAGNOSIS — Z820 Family history of epilepsy and other diseases of the nervous system: Secondary | ICD-10-CM | POA: Diagnosis not present

## 2018-11-30 DIAGNOSIS — Z9889 Other specified postprocedural states: Secondary | ICD-10-CM

## 2018-11-30 DIAGNOSIS — I712 Thoracic aortic aneurysm, without rupture, unspecified: Secondary | ICD-10-CM

## 2018-11-30 DIAGNOSIS — Z91018 Allergy to other foods: Secondary | ICD-10-CM

## 2018-11-30 DIAGNOSIS — M1990 Unspecified osteoarthritis, unspecified site: Secondary | ICD-10-CM | POA: Diagnosis not present

## 2018-11-30 DIAGNOSIS — Z87891 Personal history of nicotine dependence: Secondary | ICD-10-CM

## 2018-11-30 DIAGNOSIS — Z8546 Personal history of malignant neoplasm of prostate: Secondary | ICD-10-CM | POA: Diagnosis not present

## 2018-11-30 DIAGNOSIS — F0390 Unspecified dementia without behavioral disturbance: Secondary | ICD-10-CM | POA: Diagnosis not present

## 2018-11-30 DIAGNOSIS — Z801 Family history of malignant neoplasm of trachea, bronchus and lung: Secondary | ICD-10-CM | POA: Diagnosis not present

## 2018-11-30 DIAGNOSIS — I2510 Atherosclerotic heart disease of native coronary artery without angina pectoris: Secondary | ICD-10-CM | POA: Diagnosis present

## 2018-11-30 DIAGNOSIS — Z1159 Encounter for screening for other viral diseases: Secondary | ICD-10-CM

## 2018-11-30 DIAGNOSIS — N401 Enlarged prostate with lower urinary tract symptoms: Secondary | ICD-10-CM | POA: Diagnosis present

## 2018-11-30 DIAGNOSIS — R310 Gross hematuria: Secondary | ICD-10-CM | POA: Diagnosis present

## 2018-11-30 DIAGNOSIS — Z86718 Personal history of other venous thrombosis and embolism: Secondary | ICD-10-CM

## 2018-11-30 DIAGNOSIS — Z79899 Other long term (current) drug therapy: Secondary | ICD-10-CM | POA: Diagnosis not present

## 2018-11-30 DIAGNOSIS — I10 Essential (primary) hypertension: Secondary | ICD-10-CM | POA: Diagnosis present

## 2018-11-30 DIAGNOSIS — Z951 Presence of aortocoronary bypass graft: Secondary | ICD-10-CM

## 2018-11-30 HISTORY — PX: THORACIC AORTIC ENDOVASCULAR STENT GRAFT: SHX6112

## 2018-11-30 HISTORY — PX: CYSTOSCOPY: SHX5120

## 2018-11-30 SURGERY — INSERTION, ENDOVASCULAR STENT GRAFT, AORTA, THORACIC
Anesthesia: General | Site: Penis | Laterality: Right

## 2018-11-30 MED ORDER — GUAIFENESIN-DM 100-10 MG/5ML PO SYRP: 15.0000 mL | ORAL_SOLUTION | ORAL | Status: DC | PRN

## 2018-11-30 MED ORDER — PHENOL 1.4 % MT LIQD: 1.0000 | OROMUCOSAL | Status: DC | PRN

## 2018-11-30 MED ORDER — PHENYLEPHRINE 40 MCG/ML (10ML) SYRINGE FOR IV PUSH (FOR BLOOD PRESSURE SUPPORT)
PREFILLED_SYRINGE | INTRAVENOUS | Status: AC
  Filled 2018-11-30: qty 10

## 2018-11-30 MED ORDER — LIDOCAINE 2% (20 MG/ML) 5 ML SYRINGE
INTRAMUSCULAR | Status: AC
  Filled 2018-11-30: qty 5

## 2018-11-30 MED ORDER — SODIUM CHLORIDE 0.9 % IV SOLN: INTRAVENOUS | Status: DC

## 2018-11-30 MED ORDER — ONDANSETRON HCL 4 MG/2ML IJ SOLN: 4.0000 mg | Freq: Four times a day (QID) | INTRAMUSCULAR | Status: DC | PRN

## 2018-11-30 MED ORDER — DEXAMETHASONE SODIUM PHOSPHATE 10 MG/ML IJ SOLN
INTRAMUSCULAR | Status: DC | PRN
  Administered 2018-11-30: 5 mg via INTRAVENOUS

## 2018-11-30 MED ORDER — HYDRALAZINE HCL 20 MG/ML IJ SOLN: 5.0000 mg | INTRAMUSCULAR | Status: DC | PRN

## 2018-11-30 MED ORDER — MAGNESIUM SULFATE 2 GM/50ML IV SOLN: 2.0000 g | Freq: Every day | INTRAVENOUS | Status: DC | PRN

## 2018-11-30 MED ORDER — SODIUM CHLORIDE 0.9 % IV SOLN: 500.0000 mL | Freq: Once | INTRAVENOUS | Status: DC | PRN

## 2018-11-30 MED ORDER — DOCUSATE SODIUM 100 MG PO CAPS
100.0000 mg | ORAL_CAPSULE | Freq: Every day | ORAL | Status: DC
  Administered 2018-12-01: 100 mg via ORAL
  Filled 2018-11-30 (×2): qty 1

## 2018-11-30 MED ORDER — FENTANYL CITRATE (PF) 100 MCG/2ML IJ SOLN: 25.0000 ug | INTRAMUSCULAR | Status: DC | PRN

## 2018-11-30 MED ORDER — EPHEDRINE 5 MG/ML INJ
INTRAVENOUS | Status: AC
  Filled 2018-11-30: qty 10

## 2018-11-30 MED ORDER — CEFAZOLIN SODIUM-DEXTROSE 2-4 GM/100ML-% IV SOLN
2.0000 g | Freq: Three times a day (TID) | INTRAVENOUS | Status: AC
  Administered 2018-11-30 – 2018-12-01 (×2): 2 g via INTRAVENOUS
  Filled 2018-11-30 (×2): qty 100

## 2018-11-30 MED ORDER — ROCURONIUM BROMIDE 10 MG/ML (PF) SYRINGE
PREFILLED_SYRINGE | INTRAVENOUS | Status: DC | PRN
  Administered 2018-11-30: 40 mg via INTRAVENOUS
  Administered 2018-11-30: 50 mg via INTRAVENOUS
  Administered 2018-11-30: 10 mg via INTRAVENOUS

## 2018-11-30 MED ORDER — SIMVASTATIN 20 MG PO TABS
20.0000 mg | ORAL_TABLET | Freq: Every day | ORAL | Status: DC
  Administered 2018-11-30: 20 mg via ORAL
  Filled 2018-11-30: qty 1

## 2018-11-30 MED ORDER — FLEET ENEMA 7-19 GM/118ML RE ENEM: 1.0000 | ENEMA | Freq: Once | RECTAL | Status: DC | PRN

## 2018-11-30 MED ORDER — SODIUM CHLORIDE 0.9 % IV SOLN
INTRAVENOUS | Status: DC
  Administered 2018-11-30: 16:00:00 via INTRAVENOUS

## 2018-11-30 MED ORDER — ONDANSETRON HCL 4 MG/2ML IJ SOLN: 4.0000 mg | Freq: Once | INTRAMUSCULAR | Status: DC | PRN

## 2018-11-30 MED ORDER — ALUM & MAG HYDROXIDE-SIMETH 200-200-20 MG/5ML PO SUSP: 15.0000 mL | ORAL | Status: DC | PRN

## 2018-11-30 MED ORDER — HEPARIN SODIUM (PORCINE) 5000 UNIT/ML IJ SOLN
5000.0000 [IU] | Freq: Three times a day (TID) | INTRAMUSCULAR | Status: DC
  Administered 2018-12-01: 5000 [IU] via SUBCUTANEOUS
  Filled 2018-11-30: qty 1

## 2018-11-30 MED ORDER — CEFAZOLIN SODIUM-DEXTROSE 2-4 GM/100ML-% IV SOLN
INTRAVENOUS | Status: AC
  Filled 2018-11-30: qty 100

## 2018-11-30 MED ORDER — SENNOSIDES-DOCUSATE SODIUM 8.6-50 MG PO TABS: 1.0000 | ORAL_TABLET | Freq: Every evening | ORAL | Status: DC | PRN

## 2018-11-30 MED ORDER — SUCCINYLCHOLINE CHLORIDE 200 MG/10ML IV SOSY
PREFILLED_SYRINGE | INTRAVENOUS | Status: DC | PRN
  Administered 2018-11-30: 100 mg via INTRAVENOUS

## 2018-11-30 MED ORDER — PROTAMINE SULFATE 10 MG/ML IV SOLN
INTRAVENOUS | Status: DC | PRN
  Administered 2018-11-30: 50 mg via INTRAVENOUS

## 2018-11-30 MED ORDER — PROPOFOL 10 MG/ML IV BOLUS
INTRAVENOUS | Status: AC
  Filled 2018-11-30: qty 20

## 2018-11-30 MED ORDER — MORPHINE SULFATE (PF) 2 MG/ML IV SOLN: 1.0000 mg | INTRAVENOUS | Status: DC | PRN

## 2018-11-30 MED ORDER — POTASSIUM CHLORIDE CRYS ER 20 MEQ PO TBCR: 20.0000 meq | EXTENDED_RELEASE_TABLET | Freq: Every day | ORAL | Status: DC | PRN

## 2018-11-30 MED ORDER — ROCURONIUM BROMIDE 10 MG/ML (PF) SYRINGE
PREFILLED_SYRINGE | INTRAVENOUS | Status: AC
  Filled 2018-11-30: qty 10

## 2018-11-30 MED ORDER — SODIUM CHLORIDE 0.9 % IV SOLN
INTRAVENOUS | Status: AC
  Filled 2018-11-30: qty 1.2

## 2018-11-30 MED ORDER — OXYCODONE-ACETAMINOPHEN 5-325 MG PO TABS: 1.0000 | ORAL_TABLET | ORAL | Status: DC | PRN

## 2018-11-30 MED ORDER — BISACODYL 5 MG PO TBEC: 5.0000 mg | DELAYED_RELEASE_TABLET | Freq: Every day | ORAL | Status: DC | PRN

## 2018-11-30 MED ORDER — DONEPEZIL HCL 5 MG PO TABS: 10.0000 mg | ORAL_TABLET | Freq: Every day | ORAL | Status: DC

## 2018-11-30 MED ORDER — LIDOCAINE 2% (20 MG/ML) 5 ML SYRINGE
INTRAMUSCULAR | Status: DC | PRN
  Administered 2018-11-30: 80 mg via INTRAVENOUS

## 2018-11-30 MED ORDER — METOPROLOL TARTRATE 5 MG/5ML IV SOLN: 2.0000 mg | INTRAVENOUS | Status: DC | PRN

## 2018-11-30 MED ORDER — SUCCINYLCHOLINE CHLORIDE 200 MG/10ML IV SOSY
PREFILLED_SYRINGE | INTRAVENOUS | Status: AC
  Filled 2018-11-30: qty 10

## 2018-11-30 MED ORDER — PHENYLEPHRINE 40 MCG/ML (10ML) SYRINGE FOR IV PUSH (FOR BLOOD PRESSURE SUPPORT)
PREFILLED_SYRINGE | INTRAVENOUS | Status: DC | PRN
  Administered 2018-11-30: 40 ug via INTRAVENOUS
  Administered 2018-11-30: 80 ug via INTRAVENOUS

## 2018-11-30 MED ORDER — HEPARIN SODIUM (PORCINE) 1000 UNIT/ML IJ SOLN
INTRAMUSCULAR | Status: AC
  Filled 2018-11-30: qty 1

## 2018-11-30 MED ORDER — FENTANYL CITRATE (PF) 250 MCG/5ML IJ SOLN
INTRAMUSCULAR | Status: AC
  Filled 2018-11-30: qty 5

## 2018-11-30 MED ORDER — SODIUM CHLORIDE 0.9 % IV SOLN
INTRAVENOUS | Status: DC | PRN
  Administered 2018-11-30: 500 mL

## 2018-11-30 MED ORDER — PROTAMINE SULFATE 10 MG/ML IV SOLN
INTRAVENOUS | Status: AC
  Filled 2018-11-30: qty 5

## 2018-11-30 MED ORDER — LIDOCAINE HCL URETHRAL/MUCOSAL 2 % EX GEL
CUTANEOUS | Status: AC
  Filled 2018-11-30: qty 20

## 2018-11-30 MED ORDER — CEFAZOLIN SODIUM-DEXTROSE 2-4 GM/100ML-% IV SOLN
2.0000 g | INTRAVENOUS | Status: AC
  Administered 2018-11-30: 2 g via INTRAVENOUS

## 2018-11-30 MED ORDER — ACETAMINOPHEN 500 MG PO TABS
ORAL_TABLET | ORAL | Status: AC
  Administered 2018-11-30: 1000 mg via ORAL
  Filled 2018-11-30: qty 2

## 2018-11-30 MED ORDER — METOPROLOL TARTRATE 12.5 MG HALF TABLET
12.5000 mg | ORAL_TABLET | Freq: Two times a day (BID) | ORAL | Status: DC
  Administered 2018-11-30 – 2018-12-01 (×2): 12.5 mg via ORAL
  Filled 2018-11-30 (×2): qty 1

## 2018-11-30 MED ORDER — ONDANSETRON HCL 4 MG/2ML IJ SOLN
INTRAMUSCULAR | Status: DC | PRN
  Administered 2018-11-30: 4 mg via INTRAVENOUS

## 2018-11-30 MED ORDER — LOSARTAN POTASSIUM 50 MG PO TABS
50.0000 mg | ORAL_TABLET | Freq: Every day | ORAL | Status: DC
  Administered 2018-11-30 – 2018-12-01 (×2): 50 mg via ORAL
  Filled 2018-11-30 (×2): qty 1

## 2018-11-30 MED ORDER — DEXAMETHASONE SODIUM PHOSPHATE 10 MG/ML IJ SOLN
INTRAMUSCULAR | Status: AC
  Filled 2018-11-30: qty 2

## 2018-11-30 MED ORDER — LACTATED RINGERS IV SOLN
INTRAVENOUS | Status: DC | PRN
  Administered 2018-11-30: 07:00:00 via INTRAVENOUS

## 2018-11-30 MED ORDER — ACETAMINOPHEN 500 MG PO TABS
1000.0000 mg | ORAL_TABLET | Freq: Once | ORAL | Status: AC
  Administered 2018-11-30: 1000 mg via ORAL

## 2018-11-30 MED ORDER — ONDANSETRON HCL 4 MG/2ML IJ SOLN
INTRAMUSCULAR | Status: AC
  Filled 2018-11-30: qty 2

## 2018-11-30 MED ORDER — 0.9 % SODIUM CHLORIDE (POUR BTL) OPTIME
TOPICAL | Status: DC | PRN
  Administered 2018-11-30: 1000 mL

## 2018-11-30 MED ORDER — LIDOCAINE HCL URETHRAL/MUCOSAL 2 % EX GEL
CUTANEOUS | Status: DC | PRN
  Administered 2018-11-30: 1

## 2018-11-30 MED ORDER — SODIUM CHLORIDE 0.9 % IV SOLN
INTRAVENOUS | Status: DC | PRN
  Administered 2018-11-30: 40 ug/min via INTRAVENOUS

## 2018-11-30 MED ORDER — LABETALOL HCL 5 MG/ML IV SOLN: 10.0000 mg | INTRAVENOUS | Status: DC | PRN

## 2018-11-30 MED ORDER — IODIXANOL 320 MG/ML IV SOLN
INTRAVENOUS | Status: DC | PRN
  Administered 2018-11-30: 40.3 mL via INTRA_ARTERIAL

## 2018-11-30 MED ORDER — FENTANYL CITRATE (PF) 250 MCG/5ML IJ SOLN
INTRAMUSCULAR | Status: DC | PRN
  Administered 2018-11-30: 50 ug via INTRAVENOUS
  Administered 2018-11-30: 100 ug via INTRAVENOUS

## 2018-11-30 MED ORDER — TAMSULOSIN HCL 0.4 MG PO CAPS
0.4000 mg | ORAL_CAPSULE | Freq: Every day | ORAL | Status: DC
  Administered 2018-11-30: 0.4 mg via ORAL
  Filled 2018-11-30: qty 1

## 2018-11-30 MED ORDER — EPHEDRINE SULFATE-NACL 50-0.9 MG/10ML-% IV SOSY
PREFILLED_SYRINGE | INTRAVENOUS | Status: DC | PRN
  Administered 2018-11-30 (×2): 10 mg via INTRAVENOUS
  Administered 2018-11-30: 5 mg via INTRAVENOUS

## 2018-11-30 MED ORDER — CLOPIDOGREL BISULFATE 75 MG PO TABS
75.0000 mg | ORAL_TABLET | ORAL | Status: DC
  Administered 2018-11-30: 75 mg via ORAL
  Filled 2018-11-30 (×2): qty 1

## 2018-11-30 MED ORDER — ACETAMINOPHEN 325 MG RE SUPP: 325.0000 mg | RECTAL | Status: DC | PRN

## 2018-11-30 MED ORDER — PANTOPRAZOLE SODIUM 40 MG PO TBEC
40.0000 mg | DELAYED_RELEASE_TABLET | Freq: Every day | ORAL | Status: DC
  Administered 2018-11-30 – 2018-12-01 (×2): 40 mg via ORAL
  Filled 2018-11-30 (×2): qty 1

## 2018-11-30 MED ORDER — ACETAMINOPHEN 325 MG PO TABS: 325.0000 mg | ORAL_TABLET | ORAL | Status: DC | PRN

## 2018-11-30 MED ORDER — HEPARIN SODIUM (PORCINE) 1000 UNIT/ML IJ SOLN
INTRAMUSCULAR | Status: DC | PRN
  Administered 2018-11-30: 5000 [IU] via INTRAVENOUS

## 2018-11-30 MED ORDER — PROPOFOL 10 MG/ML IV BOLUS
INTRAVENOUS | Status: DC | PRN
  Administered 2018-11-30: 30 mg via INTRAVENOUS
  Administered 2018-11-30: 100 mg via INTRAVENOUS

## 2018-11-30 SURGICAL SUPPLY — 58 items
ADH SKN CLS APL DERMABOND .7 (GAUZE/BANDAGES/DRESSINGS) ×2
CANISTER SUCT 3000ML PPV (MISCELLANEOUS) ×3 IMPLANT
CANNULA VESSEL 3MM 2 BLNT TIP (CANNULA) ×1 IMPLANT
CATH ACCU-VU SIZ PIG 5F 100CM (CATHETERS) IMPLANT
CATH COUDE FOLEY 2W 5CC 18FR (CATHETERS) ×1 IMPLANT
CATH FOLEY 2W COUNCIL 20FR 5CC (CATHETERS) ×1 IMPLANT
CLIP LIGATING EXTRA MED SLVR (CLIP) IMPLANT
CLIP LIGATING EXTRA SM BLUE (MISCELLANEOUS) IMPLANT
COVER PROBE W GEL 5X96 (DRAPES) ×3 IMPLANT
COVER WAND RF STERILE (DRAPES) ×2 IMPLANT
DERMABOND ADVANCED (GAUZE/BANDAGES/DRESSINGS) ×1
DERMABOND ADVANCED .7 DNX12 (GAUZE/BANDAGES/DRESSINGS) IMPLANT
DEVICE CLOSURE PERCLS PRGLD 6F (VASCULAR PRODUCTS) IMPLANT
DRSG TEGADERM 2-3/8X2-3/4 SM (GAUZE/BANDAGES/DRESSINGS) ×3 IMPLANT
DRSG TEGADERM 4X4.75 (GAUZE/BANDAGES/DRESSINGS) ×1 IMPLANT
DRYSEAL FLEXSHEATH 22FR 33CM (SHEATH) ×1
ELECT CAUTERY BLADE 6.4 (BLADE) ×1 IMPLANT
ELECT REM PT RETURN 9FT ADLT (ELECTROSURGICAL) ×6
ELECTRODE REM PT RTRN 9FT ADLT (ELECTROSURGICAL) ×4 IMPLANT
GAUZE SPONGE 2X2 8PLY STRL LF (GAUZE/BANDAGES/DRESSINGS) IMPLANT
GLOVE BIOGEL PI IND STRL 6.5 (GLOVE) IMPLANT
GLOVE BIOGEL PI INDICATOR 6.5 (GLOVE) ×1
GLOVE SS BIOGEL STRL SZ 7.5 (GLOVE) ×2 IMPLANT
GLOVE SUPERSENSE BIOGEL SZ 7.5 (GLOVE) ×2
GOWN STRL NON-REIN LRG LVL3 (GOWN DISPOSABLE) ×1 IMPLANT
GOWN STRL REUS W/ TWL LRG LVL3 (GOWN DISPOSABLE) ×6 IMPLANT
GOWN STRL REUS W/TWL LRG LVL3 (GOWN DISPOSABLE) ×9
GUIDEWIRE STR DUAL SENSOR (WIRE) ×1 IMPLANT
KIT BASIN OR (CUSTOM PROCEDURE TRAY) ×3 IMPLANT
KIT TURNOVER KIT B (KITS) ×3 IMPLANT
NDL PERC 18GX7CM (NEEDLE) ×2 IMPLANT
NEEDLE PERC 18GX7CM (NEEDLE) ×3 IMPLANT
NS IRRIG 1000ML POUR BTL (IV SOLUTION) ×5 IMPLANT
PACK ENDOVASCULAR (PACKS) ×3 IMPLANT
PAD ARMBOARD 7.5X6 YLW CONV (MISCELLANEOUS) ×6 IMPLANT
PENCIL BUTTON HOLSTER BLD 10FT (ELECTRODE) ×2 IMPLANT
PERCLOSE PROGLIDE 6F (VASCULAR PRODUCTS) ×6
SET IRRIG Y TYPE TUR BLADDER L (SET/KITS/TRAYS/PACK) ×1 IMPLANT
SET MICROPUNCTURE 5F STIFF (MISCELLANEOUS) ×1 IMPLANT
SHEATH AVANTI 11CM 8FR (SHEATH) IMPLANT
SHEATH DRYSEAL FLEX 22FR 33CM (SHEATH) IMPLANT
SPONGE GAUZE 2X2 STER 10/PKG (GAUZE/BANDAGES/DRESSINGS) ×1
STAPLER VISISTAT 35W (STAPLE) IMPLANT
STENT GRFT THORAC ACS 40X40X15 (Endovascular Graft) ×1 IMPLANT
STOPCOCK MORSE 400PSI 3WAY (MISCELLANEOUS) ×3 IMPLANT
SUT ETHILON 3 0 PS 1 (SUTURE) IMPLANT
SUT PROLENE 5 0 C 1 24 (SUTURE) IMPLANT
SUT VIC AB 2-0 CTX 36 (SUTURE) IMPLANT
SUT VIC AB 3-0 SH 27 (SUTURE)
SUT VIC AB 3-0 SH 27X BRD (SUTURE) IMPLANT
SUT VIC AB 4-0 PS2 18 (SUTURE) ×1 IMPLANT
SYR 30ML LL (SYRINGE) ×1 IMPLANT
SYR 50ML LL SCALE MARK (SYRINGE) ×3 IMPLANT
TOWEL GREEN STERILE (TOWEL DISPOSABLE) ×3 IMPLANT
TRAY FOLEY MTR SLVR 16FR STAT (SET/KITS/TRAYS/PACK) ×4 IMPLANT
TUBING HIGH PRESSURE 120CM (CONNECTOR) ×3 IMPLANT
WIRE BENTSON .035X145CM (WIRE) ×1 IMPLANT
WIRE STIFF LUNDERQUIST 260CM (WIRE) ×1 IMPLANT

## 2018-11-30 NOTE — OR Nursing (Signed)
Attempted foley insertion x 2  and 1 attempt using 90F coude by Dr. Donnetta Hutching with use of 2% lidocaine jelly. Dr. Junious Silk (Urology on call) was paged to assist in foley insertion.

## 2018-11-30 NOTE — Op Note (Signed)
Preoperative diagnosis: BPH, difficult Foley Postoperative diagnosis: BPH, difficult Foley, gross hematuria  Procedure: Flexible cystoscopy with wire passage and placement of Foley over wire, a 20 [place] council tip catheter  Surgeon: Junious Silk  Anesthesia: General  Indications for procedure: Mr. Bonsall is an 85 year old male with history of BPH and open simple prostatectomy and follow-up TURP with Dr. Era Bumpers.  Dr. Donnetta Hutching brought the patient today For endograft repair of thoracic aneurysm and they could not get a Foley placed.  They tried a couple of times and with a coud catheter.  Urology was consulted.  Findings: On exam the penis is uncircumcised and without mass or lesion.  The scrotum appeared normal.  The meatus appeared normal.  His bladder was distended on palpation of the suprapubic area and I felt like he did need a catheter.  Description of procedure: The penis was prepped with Betadine and draped in the usual sterile fashion.  A flexible cystoscope was passed per urethra with the verumontanum was visualized posterior to this there was some blood clot and possibly a false passage.  I initially couldn't find my way into the bladder but came back to the vera montanum and hold very tight anteriorly and was able to follow this up into the bladder.  The bladder was very cloudy with some bleeding and clots.  The clots were evacuated with a 50 cc syringe and then I was able to make out the bladder, trigone and bladder neck as I filled with clear irrigant. It took a few tries to find my way into the bladder and the protective glasses were fogging up and I had to have the nurse remove them so that I could see. They didn't set up the OR cysto, it was a bedside flexible scope without a camera.   The mask was the loop mask and it dropped under my nose on it's own, so it had to be repositioned.  I passed a sensor wire and coiled this in the bladder and then backed the scope out.  Over the sensor wire  20 [place] council tip catheter was passed without difficulty.  It was left to gravity drainage with the urine was light red without clots.  I seated it properly at the bladder neck after inflating the balloon.  Complications: None  Blood loss: Minimal  Specimens: None  Drains: 20 French Council tip catheter  Disposition: Patient turned back over to the care of Dr. Darleene Cleaver discussed with Dr. Donnetta Hutching and his PA, I would leave the patient on a nightly cephalexin 500 mg to prevent urinary tract infection.  I'll arrange for the patient to come back next week for a voiding trial, since he will likely go home tomorrow.  Given the Foley trauma and bladder distention I think he is at high risk for urinary retention if we remove the foley tomorrow.

## 2018-11-30 NOTE — Anesthesia Postprocedure Evaluation (Signed)
Anesthesia Post Note  Patient: Charles Terry  Procedure(s) Performed: THORACIC AORTIC ENDOVASCULAR STENT GRAFT (Right Groin)     Patient location during evaluation: PACU Anesthesia Type: General Level of consciousness: awake and alert and awake Pain management: pain level controlled Vital Signs Assessment: post-procedure vital signs reviewed and stable Respiratory status: spontaneous breathing, nonlabored ventilation, respiratory function stable and patient connected to nasal cannula oxygen Cardiovascular status: blood pressure returned to baseline and stable Postop Assessment: no apparent nausea or vomiting Anesthetic complications: no    Last Vitals:  Vitals:   11/30/18 1235 11/30/18 1327  BP: 109/73 119/61  Pulse: (!) 54 (!) 57  Resp: 15 17  Temp: (!) 36.4 C (!) 36.3 C  SpO2: 99% 98%    Last Pain:  Vitals:   11/30/18 1327  TempSrc: Oral  PainSc:                  Catalina Gravel

## 2018-11-30 NOTE — H&P (Signed)
Office Visit  08/28/2018 [facility] -Judge Stall, Arvilla Meres, MD Vascular Surgery  Thoracic aortic aneurysm without rupture [facility]) Dx  Aneurysm   ; Referred by Deland Pretty, MD Reason for Visit  Additional Documentation  Vitals:   BP 130/68 (BP Location: Left Wrist, Patient Position: Sitting, Cuff Size: Normal)  Pulse 53   Temp 97.5 F (36.4 C) (Oral)  Ht 5' 7.5" (1.715 m)  Wt 73.8 kg  SpO2 100%  BMI 25.11 kg/m  BSA 1.87 m  Flowsheets:   Healthcare Directives,  MEWS Score,  Anthropometrics    Encounter Info:   Billing Info,  History,  Allergies,  Detailed Report    All Notes  Progress Notes by Rosetta Posner, MD at 08/28/2018 1:45 PM Author: Rosetta Posner, MD Author Type: Physician Filed: 08/28/2018 5:07 PM  Note Status: Signed Cosign: Cosign Not Required Encounter Date: 08/28/2018  Editor: Rosetta Posner, MD (Physician)                                          [facility]  Patient name: Charles Terry        MRN: [id number]        DOB: 1927-11-14          Sex: male  REASON FOR VISIT: Follow-up thoracic aneurysm  HPI: Charles Terry is a 85 y.o. male here today for follow-up of his asymptomatic thoracic aneurysm.  He is status post open abdominal aortic aneurysm repair by myself in the remote past.  He has a known asymptomatic thoracic aneurysm and is here today for CT follow-up.  He remains quite active and healthy at his age of 19.  No recent cardiac issues.      Past Medical History:  Diagnosis Date  . BPH (benign prostatic hypertrophy) with urinary obstruction 11/12   surg  . Coronary artery disease involving native coronary artery 2005   100% LAD, 90% proximal circumflex followed by 80% OM1, 100% RCA. Right PDA and left DL in a codominant system --> CABG '05. low risk Nuc 5/12  . Dyslipidemia   . Essential hypertension   . H/O transfusion of packed red blood cells MAY 2012  . History of  AAA (abdominal aortic aneurysm) repair 2006   AAA R&G Hemasheild 14 mm x 10 mm (Dr. Donnetta Hutching);; CT scan in 2013 shows 4.8 cm thoracic aortic aneurysm - the followed by Dr. Donnetta Hutching  . History of blood clots    right lower extremity dvt  after inguinal hernia surgery may 2012--doppler follow up report 04/07/11 at [facility] heart and vaxcular shows resolution of the dvt--but pt is to have IVC filter placed 05/05/11--prior to planned prostate surgery scheduled for 05/09/11.  [name] History of DVT of lower extremity    right lower extremity dvt  after inguinal hernia surgery may 2012--doppler follow up report 04/07/11 at [facility] heart and vaxcular shows resolution of the dvt--but pt is to have IVC filter placed 05/05/11--prior to planned prostate surgery scheduled for 05/09/11.  [name] History of hematuria   . Renal cyst may 2012   bleeding from right renal cyst /acute renal failure secondary to urinary retention from bph --pt states  the cyst is no longer a problem--pt still has indwelling foley catheter--plans  open  suprapubic prostatectomy on 11/19 with dr. Gaynelle Arabian  . S/P CABG x 5  2005    LIMA-LAD, SeqSVG-OM1-OM2, Seq SVG- RPDA-LPL  . S/P insertion of IVC (inferior vena caval) filter 05/05/11   prior to prostate surgery         Family History  Problem Relation Age of Onset  . Aneurysm Mother        Died at age 23  . Aneurysm Father   . Lung cancer Father        Died at age 49  . Dementia Sister   . Prostate cancer Brother   . Heart attack Brother 85  . Dementia Brother   . Alzheimer's disease Brother     SOCIAL HISTORY: Social History   Tobacco Use  . Smoking status: Former Smoker    Packs/day: 1.00    Years: 20.00    Pack years: 20.00    Last attempt to quit: 12/17/1965    Years since quitting: 52.7  . Smokeless tobacco: Never Used  Substance Use Topics  . Alcohol use: Yes    Alcohol/week: 2.0 - 3.0 standard drinks    Types: 2 - 3 Standard  drinks or equivalent per week    Comment: OCCAS         Allergies  Allergen Reactions  . Food Anaphylaxis    NO KIWI FRUIT   . Aspirin Rash and Other (See Comments)    ANGIOEDEMA          Current Outpatient Medications  Medication Sig Dispense Refill  . Calcium Carbonate (CALTRATE 600 PO) Take 600 mg by mouth 2 (two) times daily.     . Cholecalciferol (D3-1000 PO) Take 1,000 Units by mouth daily.     . clopidogrel (PLAVIX) 75 MG tablet Take 1 tablet (75 mg total) by mouth every other day. 90 tablet 3  . donepezil (ARICEPT) 10 MG tablet Take 1 tablet (10 mg total) by mouth at bedtime. 90 tablet 3  . fish oil-omega-3 fatty acids 1000 MG capsule Take 1 g by mouth daily.    [name] losartan (COZAAR) 100 MG tablet TAKE 1/2 (ONE-HALF) TABLET BY MOUTH ONCE DAILY 45 tablet 0  . metoprolol tartrate (LOPRESSOR) 25 MG tablet Take 12.5 mg by mouth 2 (two) times daily.     . Multiple Vitamins-Minerals (MULTIVITAMINS THER. W/MINERALS) TABS Take 1 tablet by mouth daily.     . simvastatin (ZOCOR) 40 MG tablet Take 20 mg by mouth at bedtime.     . tamsulosin (FLOMAX) 0.4 MG CAPS capsule Take 0.4 mg by mouth daily after supper.      No current facility-administered medications for this visit.     REVIEW OF SYSTEMS:  [X]  denotes positive finding, [ ]  denotes negative finding Cardiac  Comments:  Chest pain or chest pressure:    Shortness of breath upon exertion:    Short of breath when lying flat:    Irregular heart rhythm:        Vascular    Pain in calf, thigh, or hip brought on by ambulation:    Pain in feet at night that wakes you up from your sleep:     Blood clot in your veins:    Leg swelling:           PHYSICAL EXAM:    Vitals:   08/28/18 1401  BP: 130/68  Pulse: (!) 53  Temp: (!) 97.5 F (36.4 C)  TempSrc: Oral  SpO2: 100%  Weight: 162 lb 11.2 oz (73.8 kg)  Height: 5' 7.5" (1.715 m)    GENERAL: The patient is  a  well-nourished male, in no acute distress. The vital signs are documented above. CARDIOVASCULAR: 2+ radial and 2+ femoral pulses bilaterally.  Carotid arteries without bruits bilaterally. PULMONARY: There is good air exchange  MUSCULOSKELETAL: There are no major deformities or cyanosis. NEUROLOGIC: No focal weakness or paresthesias are detected. SKIN: There are no ulcers or rashes noted. PSYCHIATRIC: The patient has a normal affect.  DATA:  CT scan of his chest abdomen and pelvis was reviewed.  This does show continued progression in size of his thoracic aneurysm.  It is now 6 cm in diameter.  The aneurysm begins distal to the left subclavian takeoff and ends well above the celiac artery takeoff.  MEDICAL ISSUES: Had a very long discussion with the patient and his wife present.  He has had progressive enlargement of his aneurysm approximately half a centimeter in a year.  It is now 6 cm in diameter.  He does appear to be an excellent candidate for stent grafting.  I have recommended that we have him see Dr. Ellyn Hack for cardiac risk stratification.  Assuming he is not felt to be at a high risk I would recommend thoracic stent graft repair.  Discussed the procedure and with an expected one night hospitalization.  Does have adequate iliac artery lumen size for graft deployment.  We will discuss this further following cardiac evaluation.  He does have a trip planned with multiple generations of family in May and I have suggested that we defer treatment until after his vacation if decision is to proceed.    Rosetta Posner, MD FACS [facility] [phone] Pager [phone]     Addendum:  The patient has been re-examined and re-evaluated.  The patient's history and physical has been reviewed and is unchanged.    Charles Terry is a 85 y.o. male is being admitted with THORACIC AORTIC ANEURYSM. All the risks, benefits and other treatment  options have been discussed with the patient. The patient has consented to proceed with Procedure(s): THORACIC AORTIC ENDOVASCULAR STENT GRAFT as a surgical intervention.  Jackilyn Umphlett 11/30/2018 7:14 AM Vascular and Vein Surgery

## 2018-11-30 NOTE — Op Note (Signed)
    OPERATIVE REPORT  DATE OF SURGERY: 11/30/2018  PATIENT: Charles Terry, 85 y.o. male MRN: [id number]  DOB: June 10, 1928  PRE-OPERATIVE DIAGNOSIS: Thoracic aneurysm  POST-OPERATIVE DIAGNOSIS:  Same  PROCEDURE: Gore tag endograft repair of thoracic aneurysm  SURGEON:  Curt Jews, M.D.  PHYSICIAN ASSISTANT: Laurence Slate, PA-C  ANESTHESIA: General  EBL: per anesthesia record  Total I/O In: 1100 [I.V.:1100] Out: 400 [Urine:350; Blood:50]  BLOOD ADMINISTERED: none  DRAINS: none  SPECIMEN: none  COUNTS CORRECT:  YES  PATIENT DISPOSITION:  PACU - hemodynamically stable  PROCEDURE DETAILS: Patient was taken to the operating placed supine position.  There was a difficulty with Foley catheter placement and Dr. Junious Silk was consulted who placed a Foley with flexible scope.  Will be dictated as a separate note  The abdomen both groins were then prepped and draped in the usual sterile fashion.  Using SonoSite ultrasound a singlewall puncture in the anterior wall of the common femoral artery was placed and a guidewire was passed through this up to the level of the aorta.  8 [place] dilator was passed over this and 2 Perclose sutures were preplaced at the 10:00 and 2 o'clock position.  8 French sheath was then passed over the guidewire.  A pigtail catheter was positioned over the guidewire to the level of the aortic arch and a Lunderquist superstiff wire was positioned through the pigtail pigtail catheter which was removed.  A 22 French sheath was then passed over the Lunderquist wire after removing the 8 [place] catheter.  A buddy wire technique was used to pass a pigtail catheter alongside the Lunderquist wire in the descending thoracic aorta.  Injection revealed the level of the outpouching of the thoracic aneurysm.  The 40 x 15 tag stent graft was placed over the Lunderquist wire and was positioned to give approximately 3 cm of coverage proximally and 4-5 distal coverage on normal aorta.   The pigtail catheter was removed.  The stent graft was deployed.  The pigtail catheter was then passed back over the Lunderquist wire and the Lunderquist wire was removed.  Completion arteriogram revealed excellent apposition with no evidence of endoleak.  A Bentson wire was placed back through the pigtail catheter and the pigtail catheter was removed.  The 22 French sheath was removed and the Perclose sutures were secured giving excellent hemostasis.  The patient was given 5000 units of intravenous heparin prior to placement of the large sheath.  The was given 50 mg of protamine to reverse heparin.  A 4-0 Vicryl suture was used to close the puncture site.  The patient was transferred to the recovery room in stable condition   Rosetta Posner, M.D., [facility] 11/30/2018 10:59 AM

## 2018-11-30 NOTE — Anesthesia Procedure Notes (Signed)
Arterial Line Insertion Start/End6/05/2019 6:35 AM, 11/30/2018 6:45 AM Performed by: Imagene Riches, CRNA, CRNA  Patient location: Pre-op. Preanesthetic checklist: patient identified, IV checked, site marked, risks and benefits discussed, surgical consent, monitors and equipment checked, pre-op evaluation, timeout performed and anesthesia consent Left, radial was placed Catheter size: 20 G Hand hygiene performed  and maximum sterile barriers used  Allen's test indicative of satisfactory collateral circulation Attempts: 2 Procedure performed without using ultrasound guided technique. Following insertion, dressing applied and Biopatch. Post procedure assessment: normal  Patient tolerated the procedure well with no immediate complications.

## 2018-11-30 NOTE — Plan of Care (Signed)
  Problem: Education: Goal: Knowledge of General Education information will improve Description Including pain rating scale, medication(s)/side effects and non-pharmacologic comfort measures Outcome: Progressing   

## 2018-11-30 NOTE — Consult Note (Signed)
Consultation: Gross hematuria, BPH, difficult Foley Requested by: Dr. Sherren Mocha Early  History of Present Illness: Charles Terry is an 85 year old male with history of BPH and open simple prostatectomy and follow-up TURP with Dr. Era Bumpers.  Dr. Donnetta Hutching brought the patient today For endograft repair of thoracic aneurysm and they could not get a Foley placed.  They tried a couple of times and with a coud catheter.  Urology was consulted.  I was able to find the bladder with cystoscopy by hugging the anterior channel and through the prostate.  There might of been a posterior false passage.  There was a small amount of clot in the bladder which was irrigated out.  A 20 [place] council tip catheter was placed.  Nurse called this afternoon and said he was bleeding from around the Foley.  He is doing well tonight.  Urine was red but now is clear in the tubing.  He finally got some dinner and is eating a [place] burger.  I reviewed his CT angiogram from August 28, 2018 which showed cysts in both kidneys, benign urinary tract, BPH.   Past Medical History:  Diagnosis Date  . Arthritis   . BPH (benign prostatic hypertrophy) with urinary obstruction 11/12   surg  . Coronary artery disease involving native coronary artery 2005   100% LAD, 90% proximal circumflex followed by 80% OM1, 100% RCA. Right PDA and left DL in a codominant system --> CABG '05. low risk Nuc 5/12  . Dementia ([place])   . Dyslipidemia   . Essential hypertension   . H/O transfusion of packed red blood cells MAY 2012  . History of AAA (abdominal aortic aneurysm) repair 2006   AAA R&G Hemasheild 14 mm x 10 mm (Dr. Donnetta Hutching);; CT scan in 2013 shows 4.8 cm thoracic aortic aneurysm - the followed by Dr. Donnetta Hutching  . History of blood clots    right lower extremity dvt  after inguinal hernia surgery may 2012--doppler follow up report 04/07/11 at [facility] heart and vaxcular shows resolution of the dvt--but pt is to have IVC filter placed 05/05/11--prior to planned  prostate surgery scheduled for 05/09/11.  [name] History of DVT of lower extremity    right lower extremity dvt  after inguinal hernia surgery may 2012--doppler follow up report 04/07/11 at [facility] heart and vaxcular shows resolution of the dvt--but pt is to have IVC filter placed 05/05/11--prior to planned prostate surgery scheduled for 05/09/11.  [name] History of hematuria   . Renal cyst may 2012   bleeding from right renal cyst /acute renal failure secondary to urinary retention from bph --pt states  the cyst is no longer a problem--pt still has indwelling foley catheter--plans  open  suprapubic prostatectomy on 11/19 with dr. Gaynelle Arabian  . S/P CABG x 5 2005    LIMA-LAD, SeqSVG-OM1-OM2, Seq SVG- RPDA-LPL  . S/P insertion of IVC (inferior vena caval) filter 05/05/11   prior to prostate surgery   Past Surgical History:  Procedure Laterality Date  . 2D ECHOCARDIOGRAM  11/02/2010; June 2017   a. EF 45-50%, w/ anterior hypokinesis, otherwise normal; mildly sclerotic aortic valve;; b.EF 60-65%. No regional wall motion abnormality. Grade 1 diastolic dysfunction. Mildly reduced RV function. Mild elevation of pulmonary pressures   . AAA RPAIR  03/29/2005   14x35mm Hemashield graft  . CARDIAC CATHETERIZATION  12/23/2003   Recommended CABG; 100% LAD, 90% proximal circumflex followed by 80% OM1, 100% RCA. Right PDA and left DL in a codominant system.  . CORONARY ARTERY BYPASS GRAFT  12/25/2003   x5, LIMA-LAD, SeqSVG-OM1-OM2, Seq SVG- RPDA-LPL  . HERNIA REPAIR Left    MAY 2012  . IVC  11/12   placed prior to prostate surg  . PERSANTINE MYOVIEW  10/22/2010   Normal perfusion  . PROSTATECTOMY  05/09/2011   Procedure: PROSTATECTOMY SUPRAPUBIC;  Surgeon: Ailene Rud, MD;  Location: [facility];  Service: Urology;  Laterality: N/A;  Open  . TRANSURETHRAL RESECTION OF PROSTATE N/A 12/24/2015   Procedure: TRANSURETHRAL RESECTION OF THE PROSTATE (TURP);  Surgeon: Carolan Clines, MD;  Location: [facility];   Service: Urology;  Laterality: N/A;  . VENOUS DOPPLER  04/04/2011   No evidence of thrombus or thrombophlebitis    Home Medications:  Medications Prior to Admission  Medication Sig Dispense Refill Last Dose  . Calcium Carbonate (CALTRATE 600 PO) Take 600 mg by mouth 2 (two) times daily.    Past Week at Unknown time  . donepezil (ARICEPT) 10 MG tablet Take 1 tablet (10 mg total) by mouth at bedtime. 90 tablet 3 11/30/2018 at 0400  . fish oil-omega-3 fatty acids 1000 MG capsule Take 1 g by mouth daily.   Past Week at Unknown time  . losartan (COZAAR) 100 MG tablet Take 1/2 (one-half) tablet by mouth once daily (Patient taking differently: Take 50 mg by mouth daily. ) 45 tablet 4 11/29/2018 at Unknown time  . metoprolol tartrate (LOPRESSOR) 25 MG tablet Take 12.5 mg by mouth 2 (two) times daily.    11/30/2018 at 0400  . Multiple Vitamins-Minerals (MULTIVITAMINS THER. W/MINERALS) TABS Take 1 tablet by mouth daily.    Past Week at Unknown time  . simvastatin (ZOCOR) 40 MG tablet Take 20 mg by mouth at bedtime.    11/29/2018 at Unknown time  . tamsulosin (FLOMAX) 0.4 MG CAPS capsule Take 0.4 mg by mouth daily after supper.    11/30/2018 at 0400  . clopidogrel (PLAVIX) 75 MG tablet Take 1 tablet (75 mg total) by mouth every other day. 90 tablet 3 11/22/2018   Allergies:  Allergies  Allergen Reactions  . Food Anaphylaxis    NO KIWI FRUIT   . Aspirin Rash and Other (See Comments)    ANGIOEDEMA    Family History  Problem Relation Age of Onset  . Aneurysm Mother        Died at age 67  . Aneurysm Father   . Lung cancer Father        Died at age 56  . Dementia Sister   . Prostate cancer Brother   . Heart attack Brother 25  . Dementia Brother   . Alzheimer's disease Brother    Social History:  reports that he quit smoking about 52 years ago. He has a 20.00 pack-year smoking history. He has never used smokeless tobacco. He reports current alcohol use of about 2.0 - 3.0 standard drinks of alcohol  per week. He reports that he does not use drugs.  ROS: A complete review of systems was performed.  All systems are negative except for pertinent findings as noted. Review of Systems  All other systems reviewed and are negative.    Physical Exam:  Vital signs in last 24 hours: Temp:  [97.3 F (36.3 C)-98.2 F (36.8 C)] 98.2 F (36.8 C) (06/12 1900) Pulse Rate:  [52-59] 57 (06/12 1327) Resp:  [12-17] 15 (06/12 1900) BP: (109-156)/(55-73) 134/60 (06/12 1900) SpO2:  [98 %-100 %] 100 % (06/12 1900) Arterial Line BP: (133-136)/(52-54) 133/52 (06/12 1150) Weight:  [72.1 kg] 72.1 kg (  06/12 6301) General:  Alert and oriented, No acute distress HEENT: Normocephalic, atraumatic Cardiovascular: Regular rate and rhythm Lungs: Regular rate and effort Abdomen: Soft, nontender, nondistended, no abdominal masses Back: No CVA tenderness Extremities: No edema Neurologic: Grossly intact GU: Penis uncircumcised, 20 [place] council tip catheter in place, some old blood around the catheter.  Urine clear in tubing.  Laboratory Data:  No results found for this or any previous visit (from the past 24 hour(s)). Recent Results (from the past 240 hour(s))  Surgical pcr screen     Status: None   Collection Time: 11/27/18  8:39 AM   Specimen: Nasal Mucosa; Nasal Swab  Result Value Ref Range Status   MRSA, PCR NEGATIVE NEGATIVE Final   Staphylococcus aureus NEGATIVE NEGATIVE Final    Comment: (NOTE) The Xpert SA Assay (FDA approved for NASAL specimens in patients 46 years of age and older), is one component of a comprehensive surveillance program. It is not intended to diagnose infection nor to guide or monitor treatment. Performed at [facility], [place] [address] [place], [place] [address]   Novel Coronavirus, NAA (hospital order; send-out to ref lab)     Status: None   Collection Time: 11/27/18  9:14 AM   Specimen: Nasopharyngeal Swab; Respiratory  Result Value Ref Range Status    SARS-CoV-2, NAA NOT DETECTED NOT DETECTED Final    Comment: (NOTE) This test was developed and its performance characteristics determined by [employer]. This test has not been FDA cleared or approved. This test has been authorized by FDA under an Emergency Use Authorization (EUA). This test is only authorized for the duration of time the declaration that circumstances exist justifying the authorization of the emergency use of in vitro diagnostic tests for detection of SARS-CoV-2 virus and/or diagnosis of COVID-19 infection under section 564(b)(1) of the Act, 21 U.S.C. 323FTD-3(U)(2), unless the authorization is terminated or revoked sooner. When diagnostic testing is negative, the possibility of a false negative result should be considered in the context of a patient's recent exposures and the presence of clinical signs and symptoms consistent with COVID-19. An individual without symptoms of COVID-19 and who is not shedding SARS-CoV-2 virus would expect to have a negative (not detected) result in this assay. Performed  At: [facility] [address] [place], [place] [id number] Rush Farmer MD BJ:[phone]    [place]  Final    Comment: Performed at [facility], [place] [address], [place], [place] [address]   Creatinine: Recent Labs    11/27/18 1062  CREATININE 0.99    Impression/Assessment:  BPH, gross hematuria-  Plan:  Hematuria has resolved.  Some bleeding from the prostate around the Foley as expected and this looks to be minimal.  I discussed with the patient void trial in the morning or follow-up next week with the Foley catheter and he agreed following up next week was a good idea.  He reminded me his wife is a Marine scientist.  Again, keep the patient on a nightly cephalexin.  I will get him back next Wednesday or 30 for voiding trial.  Festus Aloe 11/30/2018, 8:39 PM

## 2018-11-30 NOTE — Anesthesia Procedure Notes (Signed)
Procedure Name: Intubation Date/Time: 11/30/2018 7:42 AM Performed by: Imagene Riches, CRNA Pre-anesthesia Checklist: Patient identified, Emergency Drugs available, Suction available and Patient being monitored Patient Re-evaluated:Patient Re-evaluated prior to induction Oxygen Delivery Method: Circle System Utilized Preoxygenation: Pre-oxygenation with 100% oxygen Induction Type: IV induction Ventilation: Mask ventilation without difficulty Laryngoscope Size: Miller and 2 Grade View: Grade I Tube type: Oral Tube size: 7.5 mm Number of attempts: 1 Airway Equipment and Method: Stylet and Oral airway Placement Confirmation: ETT inserted through vocal cords under direct vision,  positive ETCO2 and breath sounds checked- equal and bilateral Secured at: 23 cm Tube secured with: Tape Dental Injury: Teeth and Oropharynx as per pre-operative assessment

## 2018-11-30 NOTE — Transfer of Care (Signed)
Immediate Anesthesia Transfer of Care Note  Patient: Charles Terry  Procedure(s) Performed: THORACIC AORTIC ENDOVASCULAR STENT GRAFT (Right Groin)  Patient Location: PACU  Anesthesia Type:General  Level of Consciousness: awake and alert   Airway & Oxygen Therapy: Patient Spontanous Breathing and Patient connected to nasal cannula oxygen  Post-op Assessment: Report given to RN and Post -op Vital signs reviewed and stable  Post vital signs: Reviewed and stable  Last Vitals:  Vitals Value Taken Time  BP 125/60 11/30/18 1105  Temp    Pulse 58 11/30/18 1107  Resp 14 11/30/18 1107  SpO2 96 % 11/30/18 1107  Vitals shown include unvalidated device data.  Last Pain:  Vitals:   11/30/18 0613  TempSrc:   PainSc: 0-No pain      Patients Stated Pain Goal: 3 (41/28/20 8138)  Complications: No apparent anesthesia complications

## 2018-12-01 LAB — BASIC METABOLIC PANEL
Anion gap: 6 (ref 5–15)
BUN: 15 mg/dL (ref 8–23)
CO2: 23 mmol/L (ref 22–32)
Calcium: 8.3 mg/dL — ABNORMAL LOW (ref 8.9–10.3)
Chloride: 109 mmol/L (ref 98–111)
Creatinine, Ser: 0.87 mg/dL (ref 0.61–1.24)
GFR calc Af Amer: >60 mL/min (ref >60)
GFR calc non Af Amer: >60 mL/min (ref >60)
Glucose, Bld: 107 mg/dL — ABNORMAL HIGH (ref 70–99)
Potassium: 4 mmol/L (ref 3.5–5.1)
Sodium: 138 mmol/L (ref 135–145)

## 2018-12-01 LAB — CBC
HCT: 35 % — ABNORMAL LOW (ref 39.0–52.0)
Hemoglobin: 11.8 g/dL — ABNORMAL LOW (ref 13.0–17.0)
MCH: 31.7 pg (ref 26.0–34.0)
MCHC: 33.7 g/dL (ref 30.0–36.0)
MCV: 94.1 fL (ref 80.0–100.0)
Platelets: 136 10*3/uL — ABNORMAL LOW (ref 150–400)
RBC: 3.72 MIL/uL — ABNORMAL LOW (ref 4.22–5.81)
RDW: 13.7 % (ref 11.5–15.5)
WBC: 10 10*3/uL (ref 4.0–10.5)
nRBC: 0 % (ref 0.0–0.2)

## 2018-12-01 MED ORDER — OXYCODONE-ACETAMINOPHEN 5-325 MG PO TABS: 1.0000 | ORAL_TABLET | Freq: Four times a day (QID) | ORAL | 0 refills | Status: DC | PRN

## 2018-12-01 MED ORDER — CEPHALEXIN 500 MG PO CAPS: 500.0000 mg | ORAL_CAPSULE | Freq: Every day | ORAL | 0 refills | Status: AC

## 2018-12-01 NOTE — Progress Notes (Addendum)
Patient AVS given and education provided to patient and patient's wife (per telephone).  Patient discharge home with Foley per Dr. Junious Silk, MD (urology), to be seen in clinic post discharge for follow-up this upcoming Tuesday.  Peripheral IVs removed, vital signs obtained and were stable.  All questions regarding medications and appointments addressed.  Pt transported by Lattie Haw, RN to [facility]. Education provided to wife, Izora Gala on catheter care until Tuesday when patient will be seen by Dr. Alger Simons, MD.

## 2018-12-01 NOTE — Discharge Instructions (Signed)
Indwelling Urinary Catheter Care, Adult An indwelling urinary catheter is a thin tube that is put into your bladder. The tube helps to drain pee (urine) out of your body. The tube goes in through your urethra. Your urethra is where pee comes out of your body. Your pee will come out through the catheter, then it will go into a bag (drainage bag). Take good care of your catheter so it will work well. How to wear your catheter and bag Supplies needed  Sticky tape (adhesive tape) or a leg strap.  Alcohol wipe or soap and water (if you use tape).  A clean towel (if you use tape).  Large overnight bag.  Smaller bag (leg bag). Wearing your catheter Attach your catheter to your leg with tape or a leg strap.  Make sure the catheter is not pulled tight.  If a leg strap gets wet, take it off and put on a dry strap.  If you use tape to hold the bag on your leg: 1. Use an alcohol wipe or soap and water to wash your skin where the tape made it sticky before. 2. Use a clean towel to pat-dry that skin. 3. Use new tape to make the bag stay on your leg. Wearing your bags You should have been given a large overnight bag.  You may wear the overnight bag in the day or night.  Always have the overnight bag lower than your bladder.  Do not let the bag touch the floor.  Before you go to sleep, put a clean plastic bag in a wastebasket. Then hang the overnight bag inside the wastebasket. You should also have a smaller leg bag that fits under your clothes.  Always wear the leg bag below your knee.  Do not wear your leg bag at night. How to care for your skin and catheter Supplies needed  A clean washcloth.  Water and mild soap.  A clean towel. Caring for your skin and catheter      Clean the skin around your catheter every day: ? Wash your hands with soap and water. ? Wet a clean washcloth in warm water and mild soap. ? Clean the skin around your urethra. ? If you are  male: ? Gently spread the folds of skin around your vagina (labia). ? With the washcloth in your other hand, wipe the inner side of your labia on each side. Wipe from front to back. ? If you are male: ? Pull back any skin that covers the end of your penis (foreskin). ? With the washcloth in your other hand, wipe your penis in small circles. Start wiping at the tip of your penis, then move away from the catheter. ? With your free hand, hold the catheter close to where it goes into your body. ? Keep holding the catheter during cleaning so it does not get pulled out. ? With the washcloth in your other hand, clean the catheter. ? Only wipe downward on the catheter. ? Do not wipe upward toward your body. Doing this may push germs into your urethra and cause infection. ? Use a clean towel to pat-dry the catheter and the skin around it. Make sure to wipe off all soap. ? Wash your hands with soap and water.  Shower every day. Do not take baths.  Do not use cream, ointment, or lotion on the area where the catheter goes into your body, unless your doctor tells you to.  Do not use powders, sprays,  or lotions on your genital area.  Check your skin around the catheter every day for signs of infection. Check for: ? Redness, swelling, or pain. ? Fluid or blood. ? Warmth. ? Pus or a bad smell. How to empty the bag Supplies needed  Rubbing alcohol.  Gauze pad or cotton ball.  Tape or a leg strap. Emptying the bag Pour the pee out of your bag when it is ?- full, or at least 2-3 times a day. Do this for your overnight bag and your leg bag. 1. Wash your hands with soap and water. 2. Separate (detach) the bag from your leg. 3. Hold the bag over the toilet or a clean pail. Keep the bag lower than your hips and bladder. This is so the pee (urine) does not go back into the tube. 4. Open the pour spout. It is at the bottom of the bag. 5. Empty the pee into the toilet or pail. Do not let the pour  spout touch any surface. 6. Put rubbing alcohol on a gauze pad or cotton ball. 7. Use the gauze pad or cotton ball to clean the pour spout. 8. Close the pour spout. 9. Attach the bag to your leg with tape or a leg strap. 10. Wash your hands with soap and water. Follow instructions for cleaning the drainage bag:  From the product maker.  As told by your doctor. How to change the bag Supplies needed  Alcohol wipes.  A clean bag.  Tape or a leg strap. Changing the bag Replace your bag with a clean bag once a month. If it starts to leak, smell bad, or look dirty, change it sooner. 1. Wash your hands with soap and water. 2. Separate the dirty bag from your leg. 3. Pinch the catheter with your fingers so that pee does not spill out. 4. Separate the catheter tube from the bag tube where these tubes connect (at the connection valve). Do not let the tubes touch any surface. 5. Clean the end of the catheter tube with an alcohol wipe. Use a different alcohol wipe to clean the end of the bag tube. 6. Connect the catheter tube to the tube of the clean bag. 7. Attach the clean bag to your leg with tape or a leg strap. Do not make the bag tight on your leg. 8. Wash your hands with soap and water. General rules   Never pull on your catheter. Never try to take it out. Doing that can hurt you.  Always wash your hands before and after you touch your catheter or bag. Use a mild, fragrance-free soap. If you do not have soap and water, use hand sanitizer.  Always make sure there are no twists or bends (kinks) in the catheter tube.  Always make sure there are no leaks in the catheter or bag.  Drink enough fluid to keep your pee pale yellow.  Do not take baths, swim, or use a hot tub.  If you are male, wipe from front to back after you poop (have a bowel movement). Contact a doctor if:  Your pee is cloudy.  Your pee smells worse than usual.  Your catheter gets clogged.  Your catheter  leaks.  Your bladder feels full. Get help right away if:  You have redness, swelling, or pain where the catheter goes into your body.  You have fluid, blood, pus, or a bad smell coming from the area where the catheter goes into your body.  Your skin  feels warm where the catheter goes into your body.  You have a fever.  You have pain in your: ? Belly (abdomen). ? Legs. ? Lower back. ? Bladder.  You see blood in the catheter.  Your pee is pink or red.  You feel sick to your stomach (nauseous).  You throw up (vomit).  You have chills.  Your pee is not draining into the bag.  Your catheter gets pulled out. Summary  An indwelling urinary catheter is a thin tube that is placed into the bladder to help drain pee (urine) out of the body.  The catheter is placed into the part of the body that drains pee from the bladder (urethra).  Taking good care of your catheter will keep it working properly and help prevent problems.  Always wash your hands before and after touching your catheter or bag.  Never pull on your catheter or try to take it out. This information is not intended to replace advice given to you by your health care provider. Make sure you discuss any questions you have with your health care provider. Document Released: 10/01/2012 Document Revised: 11/27/2017 Document Reviewed: 01/20/2017 Elsevier Interactive Patient Education  2019 [place].   [facility]   Discharge Instructions  Endovascular Aortic Aneurysm Repair  Please refer to the following instructions for your post-procedure care. Your surgeon or Physician Assistant will discuss any changes with you.  Activity  You are encouraged to walk as much as you can. You can slowly return to normal activities but must avoid strenuous activity and heavy lifting until your doctor tells you it's OK. Avoid activities such as vacuuming or swinging a gold club. It is normal to  feel tired for several weeks after your surgery. Do not drive until your doctor gives the OK and you are no longer taking prescription pain medications. It is also normal to have difficulty with sleep habits, eating, and bowel movements after surgery. These will go away with time.  Bathing/Showering  You may shower after you go home. If you have an incision, do not soak in a bathtub, hot tub, or swim until the incision heals completely.  Incision Care  Shower every day. Clean your incision with mild soap and water. Pat the area dry with a clean towel. You do not need a bandage unless otherwise instructed. Do not apply any ointments or creams to your incision. If you clothing is irritating, you may cover your incision with a dry gauze pad.  Diet  Resume your normal diet. There are no special food restrictions following this procedure. A low fat/low cholesterol diet is recommended for all patients with vascular disease. In order to heal from your surgery, it is CRITICAL to get adequate nutrition. Your body requires vitamins, minerals, and protein. Vegetables are the best source of vitamins and minerals. Vegetables also provide the perfect balance of protein. Processed food has little nutritional value, so try to avoid this.  Medications  Resume taking all of your medications unless your doctor or nurse practitioner tells you not to. If your incision is causing pain, you may take over-the-counter pain relievers such as acetaminophen (Tylenol). If you were prescribed a stronger pain medication, please be aware these medications can cause nausea and constipation. Prevent nausea by taking the medication with a snack or meal. Avoid constipation by drinking plenty of fluids and eating foods with a high amount of fiber, such as fruits, vegetables, and grains. Do not take Tylenol if you  are taking prescription pain medications.   Follow up  [place] office will schedule a follow-up appointment with a C.T. scan  3-4 weeks after your surgery.  Please call us immediately for any of the following conditions  Severe or worsening pain in your legs or feet or in your abdomen back or chest. Increased pain, redness, drainage (pus) from your incision sit. Increased abdominal pain, bloating, nausea, vomiting or persistent diarrhea. Fever of 101 degrees or higher. Swelling in your leg (s),  Reduce your risk of vascular disease  Stop smoking. If you would like help call [facility] at 1-800-QUIT-NOW [phone]) or [place] at [phone]. Manage your cholesterol Maintain a desired weight Control your diabetes Keep your blood pressure down  If you have questions, please call the office at [phone].

## 2018-12-01 NOTE — Progress Notes (Addendum)
[facility] [place]  Subjective  - doing well.   Objective (!) 141/67 73 98.2 F (36.8 C) (Oral) 17 96%  Intake/Output Summary (Last 24 hours) at 12/01/2018 1036 Last data filed at 12/01/2018 0941 Gross per 24 hour  Intake 1887.72 ml  Output 750 ml  Net 1137.72 ml   Left groin soft without hematoma Palpable DP pulses B Lungs non labored breathing Gen NAD   Assessment/Planning: POD # 1 TEVAR  Patent blood flow to LE, moving all 4 ext.  No neurologic deficits.    There was a difficulty with Foley catheter placement and Dr. Junious Silk was consulted who placed a Foley with flexible scope.   Foley will be maintained until f/u with Urology.  F/U will be set up.  Keflex 500 mg PO QHS x 7 days.    Stable for discharge today f/u with Dr. Donnetta Hutching in 4 weeks with CTA  Roxy Horseman 12/01/2018 10:36 AM --  Laboratory Lab Results: Recent Labs    12/01/18 0536  WBC 10.0  HGB 11.8*  HCT 35.0*  PLT 136*   BMET Recent Labs    12/01/18 0536  NA 138  K 4.0  CL 109  CO2 23  GLUCOSE 107*  BUN 15  CREATININE 0.87  CALCIUM 8.3*    COAG Lab Results  Component Value Date   INR 1.1 11/27/2018   INR 1.11 11/19/2015   INR 0.97 05/09/2011   No results found for: PTT  I agree with the above.  I have seen and evaluated the patient.  He is postoperative day #1 status post TEVAR.  His right groin cannulation site is soft without hematoma.  His legs are warm and well perfused.  He has no signs of neurologic compromise.  He is scheduled for discharge home today with Foley catheter in place as well as prophylactic antibiotics and appointment with urology next week.  He will follow-up with [place] in 1 month with CT scan of his chest.  Annamarie Major

## 2018-12-03 ENCOUNTER — Encounter (HOSPITAL_COMMUNITY): Payer: Self-pay | Admitting: Vascular Surgery

## 2018-12-04 ENCOUNTER — Other Ambulatory Visit: Payer: Self-pay

## 2018-12-04 DIAGNOSIS — R339 Retention of urine, unspecified: Secondary | ICD-10-CM | POA: Diagnosis not present

## 2018-12-04 DIAGNOSIS — I712 Thoracic aortic aneurysm, without rupture, unspecified: Secondary | ICD-10-CM

## 2018-12-04 DIAGNOSIS — N139 Obstructive and reflux uropathy, unspecified: Secondary | ICD-10-CM | POA: Diagnosis not present

## 2018-12-04 DIAGNOSIS — N401 Enlarged prostate with lower urinary tract symptoms: Secondary | ICD-10-CM | POA: Diagnosis not present

## 2018-12-04 DIAGNOSIS — R972 Elevated prostate specific antigen [PSA]: Secondary | ICD-10-CM | POA: Diagnosis not present

## 2018-12-04 NOTE — Discharge Summary (Signed)
[facility] Discharge Summary   Patient ID:  Charles Terry MRN: [id number] DOB/AGE: 10-24-1927 85 y.o.  Admit date: 11/30/2018 Discharge date: 12/01/2018 Date of Surgery: 11/30/2018 Surgeon: Surgeon(s): Early, Arvilla Meres, MD Festus Aloe, MD  Admission Diagnosis: THORACIC AORTIC ANEURYSM  Discharge Diagnoses:  THORACIC AORTIC ANEURYSM  Secondary Diagnoses: Past Medical History:  Diagnosis Date  . Arthritis   . BPH (benign prostatic hypertrophy) with urinary obstruction 11/12   surg  . Coronary artery disease involving native coronary artery 2005   100% LAD, 90% proximal circumflex followed by 80% OM1, 100% RCA. Right PDA and left DL in a codominant system --> CABG '05. low risk Nuc 5/12  . Dementia ([place])   . Dyslipidemia   . Essential hypertension   . H/O transfusion of packed red blood cells MAY 2012  . History of AAA (abdominal aortic aneurysm) repair 2006   AAA R&G Hemasheild 14 mm x 10 mm (Dr. Donnetta Hutching);; CT scan in 2013 shows 4.8 cm thoracic aortic aneurysm - the followed by Dr. Donnetta Hutching  . History of blood clots    right lower extremity dvt  after inguinal hernia surgery may 2012--doppler follow up report 04/07/11 at [facility] heart and vaxcular shows resolution of the dvt--but pt is to have IVC filter placed 05/05/11--prior to planned prostate surgery scheduled for 05/09/11.  [name] History of DVT of lower extremity    right lower extremity dvt  after inguinal hernia surgery may 2012--doppler follow up report 04/07/11 at [facility] heart and vaxcular shows resolution of the dvt--but pt is to have IVC filter placed 05/05/11--prior to planned prostate surgery scheduled for 05/09/11.  [name] History of hematuria   . Renal cyst may 2012   bleeding from right renal cyst /acute renal failure secondary to urinary retention from bph --pt states  the cyst is no longer a problem--pt still has indwelling foley catheter--plans  open  suprapubic prostatectomy on 11/19 with  dr. Gaynelle Arabian  . S/P CABG x 5 2005    LIMA-LAD, SeqSVG-OM1-OM2, Seq SVG- RPDA-LPL  . S/P insertion of IVC (inferior vena caval) filter 05/05/11   prior to prostate surgery    Procedure(s): THORACIC AORTIC ENDOVASCULAR STENT GRAFT Cystoscopy Flexible  Discharged Condition: stable  HPI: Charles Terry a 85 y.o.malehere today for follow-up of his asymptomatic thoracic aneurysm.   CT scan of his chest abdomen and pelvis was reviewed. This does show continued progression in size of his thoracic aneurysm. It is now 6 cm in diameter.  He was scheduled for TEVAR.   Hospital Course:  Charles Terry is a 85 y.o. male is S/P  Procedure(s): THORACIC AORTIC ENDOVASCULAR STENT GRAFT Cystoscopy Flexible Left groin soft without hematoma Palpable DP pulses B Patent blood flow to LE, moving all 4 ext.  No neurologic deficits.              Stable for discharge today f/u with Dr. Donnetta Hutching in 4 weeks with CTA Consults:  Treatment Team:  Festus Aloe, MD  Significant Diagnostic Studies: CBC Lab Results  Component Value Date   WBC 10.0 12/01/2018   HGB 11.8 (L) 12/01/2018   HCT 35.0 (L) 12/01/2018   MCV 94.1 12/01/2018   PLT 136 (L) 12/01/2018    BMET    Component Value Date/Time   NA 138 12/01/2018 0536   K 4.0 12/01/2018 0536   CL 109 12/01/2018 0536   CO2 23 12/01/2018 0536   GLUCOSE 107 (H) 12/01/2018 0536   BUN 15 12/01/2018 0536  CREATININE 0.87 12/01/2018 0536   CALCIUM 8.3 (L) 12/01/2018 0536   GFRNONAA >60 12/01/2018 0536   GFRAA >60 12/01/2018 0536   COAG Lab Results  Component Value Date   INR 1.1 11/27/2018   INR 1.11 11/19/2015   INR 0.97 05/09/2011     Disposition:  Discharge to :Home Discharge Instructions    Call MD for:  redness, tenderness, or signs of infection (pain, swelling, bleeding, redness, odor or green/yellow discharge around incision site)   Complete by: As directed    Call MD for:  redness, tenderness, or signs of infection  (pain, swelling, bleeding, redness, odor or green/yellow discharge around incision site)   Complete by: As directed    Call MD for:  severe or increased pain, loss or decreased feeling  in affected limb(s)   Complete by: As directed    Call MD for:  severe or increased pain, loss or decreased feeling  in affected limb(s)   Complete by: As directed    Call MD for:  temperature >100.5   Complete by: As directed    Call MD for:  temperature >100.5   Complete by: As directed    Resume previous diet   Complete by: As directed    Resume previous diet   Complete by: As directed      Allergies as of 12/01/2018      Reactions   Food Anaphylaxis   NO KIWI FRUIT   Aspirin Rash, Other (See Comments)   ANGIOEDEMA      Medication List    TAKE these medications   CALTRATE 600 PO Take 600 mg by mouth 2 (two) times daily.   cephALEXin 500 MG capsule Commonly known as: KEFLEX Take 1 capsule (500 mg total) by mouth at bedtime for 10 days.   clopidogrel 75 MG tablet Commonly known as: PLAVIX Take 1 tablet (75 mg total) by mouth every other day.   donepezil 10 MG tablet Commonly known as: ARICEPT Take 1 tablet (10 mg total) by mouth at bedtime.   fish oil-omega-3 fatty acids 1000 MG capsule Take 1 g by mouth daily.   losartan 100 MG tablet Commonly known as: COZAAR Take 1/2 (one-half) tablet by mouth once daily What changed: See the new instructions.   metoprolol tartrate 25 MG tablet Commonly known as: LOPRESSOR Take 12.5 mg by mouth 2 (two) times daily.   multivitamins ther. w/minerals Tabs tablet Take 1 tablet by mouth daily.   oxyCODONE-acetaminophen 5-325 MG tablet Commonly known as: PERCOCET/ROXICET Take 1 tablet by mouth every 6 (six) hours as needed for moderate pain.   simvastatin 40 MG tablet Commonly known as: ZOCOR Take 20 mg by mouth at bedtime.   tamsulosin 0.4 MG Caps capsule Commonly known as: FLOMAX Take 0.4 mg by mouth daily after supper.      Verbal  and written Discharge instructions given to the patient. Wound care per Discharge AVS Follow-up Information    Early, Arvilla Meres, MD In 4 weeks.   Specialties: Vascular Surgery, Cardiology Why: Office will call you to arrange your appt (sent) Contact information: [place] [address] [phone]        Festus Aloe, MD. Call.   Specialty: Urology Why: f/u in 4-5 days  Contact information: [place] [address] [phone]           Signed: Roxy Horseman 12/04/2018, 4:28 PM - For VQI Registry use --- Instructions: Press F2 to tab through selections.  Delete  question if not applicable.   Post-op:  Time to Extubation: [x ] In OR, [ ]  < 12 hrs, [ ]  12-24 hrs, [ ]  >=24 hrs Vasopressors Req. Post-op: No MI: [x ] No, [ ]  Troponin only, [ ]  EKG or Clinical New Arrhythmia: No CHF: No ICU Stay: 1 days Transfusion: No  If yes, 0 units given  Complications: Resp failure: [x ] none, [ ]  Pneumonia, [ ]  Ventilator Chg in renal function: [x ] none, [ ]  Inc. Cr > 0.5, [ ]  Temp. Dialysis, [ ]  Permanent dialysis Leg ischemia: [x ] No, [ ]  Yes, no Surgery needed, [ ]  Yes, Surgery needed, [ ]  Amputation Bowel ischemia: [x ] No, [ ]  Medical Rx, [ ]  Surgical Rx Wound complication: [x ] No, [ ]  Superficial separation/infection, [ ]  Return to OR Return to OR: No  Return to OR for bleeding: No Stroke: [x ] None, [ ]  Minor, [ ]  Major  Discharge medications: Statin use:  Yes ASA use:  No  for medical reason   Plavix use:  Yes Beta blocker use:  Yes

## 2018-12-07 ENCOUNTER — Ambulatory Visit (HOSPITAL_COMMUNITY): Payer: Self-pay

## 2018-12-07 DIAGNOSIS — I712 Thoracic aortic aneurysm, without rupture, unspecified: Secondary | ICD-10-CM

## 2018-12-13 DIAGNOSIS — E785 Hyperlipidemia, unspecified: Secondary | ICD-10-CM | POA: Diagnosis not present

## 2018-12-13 DIAGNOSIS — I10 Essential (primary) hypertension: Secondary | ICD-10-CM | POA: Diagnosis not present

## 2018-12-17 NOTE — Telephone Encounter (Signed)
Opened in error

## 2018-12-18 DIAGNOSIS — Z0000 Encounter for general adult medical examination without abnormal findings: Secondary | ICD-10-CM | POA: Diagnosis not present

## 2018-12-18 DIAGNOSIS — I70209 Unspecified atherosclerosis of native arteries of extremities, unspecified extremity: Secondary | ICD-10-CM | POA: Diagnosis not present

## 2018-12-18 DIAGNOSIS — E1121 Type 2 diabetes mellitus with diabetic nephropathy: Secondary | ICD-10-CM | POA: Diagnosis not present

## 2018-12-18 DIAGNOSIS — I2510 Atherosclerotic heart disease of native coronary artery without angina pectoris: Secondary | ICD-10-CM | POA: Diagnosis not present

## 2019-01-01 ENCOUNTER — Other Ambulatory Visit: Payer: Medicare Other

## 2019-01-01 ENCOUNTER — Ambulatory Visit
Admission: RE | Admit: 2019-01-01 | Discharge: 2019-01-01 | Disposition: A | Discharge status: Home / Self Care | Payer: Medicare Other | Source: Ambulatory Visit | Attending: Vascular Surgery | Admitting: Vascular Surgery

## 2019-01-01 ENCOUNTER — Other Ambulatory Visit: Payer: Self-pay | Admitting: Vascular Surgery

## 2019-01-01 DIAGNOSIS — I712 Thoracic aortic aneurysm, without rupture, unspecified: Secondary | ICD-10-CM

## 2019-01-01 MED ORDER — IOPAMIDOL (ISOVUE-370) INJECTION 76%
75.0000 mL | Freq: Once | INTRAVENOUS | Status: AC | PRN
  Administered 2019-01-01: 75 mL via INTRAVENOUS

## 2019-01-08 ENCOUNTER — Ambulatory Visit (INDEPENDENT_AMBULATORY_CARE_PROVIDER_SITE_OTHER): Payer: Medicare Other | Admitting: Vascular Surgery

## 2019-01-08 ENCOUNTER — Encounter: Payer: Self-pay | Admitting: Vascular Surgery

## 2019-01-08 ENCOUNTER — Other Ambulatory Visit: Payer: Self-pay

## 2019-01-08 DIAGNOSIS — I712 Thoracic aortic aneurysm, without rupture, unspecified: Secondary | ICD-10-CM

## 2019-01-08 NOTE — Progress Notes (Signed)
Virtual Visit via Telephone Note  Referring MD: Shelia Media  I connected with Charles Terry on 01/08/2019  by telephone and verified that I was speaking with the correct person using two identifiers. Patient was located at home and accompanied by his wife. I am located at [facility].   The limitations of evaluation and management by telemedicine and the availability of in person appointments have been previously discussed with the patient and are documented in the patients chart. The patient expressed understanding and consented to proceed.  PCP: Deland Pretty, MD   Chief Complaint: Follow-up stent graft repair thoracic aneurysm  History of Present Illness: Charles Terry is a 85 y.o. male with with a history of increasingly enlarging saccular aneurysm of his descending thoracic aorta.  He underwent uneventful repair with a percutaneous approach with stent graft on 11/30/2018.  He was discharged home on postoperative day #1.  He and his wife both report that he had no postoperative discomfort.  He has returned to his normal usual preoperative activities.  He remains quite active at his age of 49.  Past Medical History:  Diagnosis Date  . Arthritis   . BPH (benign prostatic hypertrophy) with urinary obstruction 11/12   surg  . Coronary artery disease involving native coronary artery 2005   100% LAD, 90% proximal circumflex followed by 80% OM1, 100% RCA. Right PDA and left DL in a codominant system --> CABG '05. low risk Nuc 5/12  . Dementia ([place])   . Dyslipidemia   . Essential hypertension   . H/O transfusion of packed red blood cells MAY 2012  . History of AAA (abdominal aortic aneurysm) repair 2006   AAA R&G Hemasheild 14 mm x 10 mm (Dr. Donnetta Hutching);; CT scan in 2013 shows 4.8 cm thoracic aortic aneurysm - the followed by Dr. Donnetta Hutching  . History of blood clots    right lower extremity dvt  after inguinal hernia surgery may 2012--doppler follow up report 04/07/11 at [facility]  heart and vaxcular shows resolution of the dvt--but pt is to have IVC filter placed 05/05/11--prior to planned prostate surgery scheduled for 05/09/11.  [name] History of DVT of lower extremity    right lower extremity dvt  after inguinal hernia surgery may 2012--doppler follow up report 04/07/11 at [facility] heart and vaxcular shows resolution of the dvt--but pt is to have IVC filter placed 05/05/11--prior to planned prostate surgery scheduled for 05/09/11.  [name] History of hematuria   . Renal cyst may 2012   bleeding from right renal cyst /acute renal failure secondary to urinary retention from bph --pt states  the cyst is no longer a problem--pt still has indwelling foley catheter--plans  open  suprapubic prostatectomy on 11/19 with dr. Gaynelle Arabian  . S/P CABG x 5 2005    LIMA-LAD, SeqSVG-OM1-OM2, Seq SVG- RPDA-LPL  . S/P insertion of IVC (inferior vena caval) filter 05/05/11   prior to prostate surgery    Past Surgical History:  Procedure Laterality Date  . 2D ECHOCARDIOGRAM  11/02/2010; June 2017   a. EF 45-50%, w/ anterior hypokinesis, otherwise normal; mildly sclerotic aortic valve;; b.EF 60-65%. No regional wall motion abnormality. Grade 1 diastolic dysfunction. Mildly reduced RV function. Mild elevation of pulmonary pressures   . AAA RPAIR  03/29/2005   14x1mm Hemashield graft  . CARDIAC CATHETERIZATION  12/23/2003   Recommended CABG; 100% LAD, 90% proximal circumflex followed by 80% OM1, 100% RCA. Right PDA and left DL in a codominant system.  . CORONARY ARTERY  BYPASS GRAFT  12/25/2003   x5, LIMA-LAD, SeqSVG-OM1-OM2, Seq SVG- RPDA-LPL  . CYSTOSCOPY N/A 11/30/2018   Procedure: Cystoscopy Flexible;  Surgeon: Festus Aloe, MD;  Location: [place];  Service: Urology;  Laterality: N/A;  . HERNIA REPAIR Left    MAY 2012  . IVC  11/12   placed prior to prostate surg  . PERSANTINE MYOVIEW  10/22/2010   Normal perfusion  . PROSTATECTOMY  05/09/2011   Procedure: PROSTATECTOMY SUPRAPUBIC;   Surgeon: Ailene Rud, MD;  Location: [facility];  Service: Urology;  Laterality: N/A;  Open  . THORACIC AORTIC ENDOVASCULAR STENT GRAFT Right 11/30/2018   Procedure: THORACIC AORTIC ENDOVASCULAR STENT GRAFT;  Surgeon: Rosetta Posner, MD;  Location: [place];  Service: Vascular;  Laterality: Right;  . TRANSURETHRAL RESECTION OF PROSTATE N/A 12/24/2015   Procedure: TRANSURETHRAL RESECTION OF THE PROSTATE (TURP);  Surgeon: Carolan Clines, MD;  Location: [facility];  Service: Urology;  Laterality: N/A;  . VENOUS DOPPLER  04/04/2011   No evidence of thrombus or thrombophlebitis    No outpatient medications have been marked as taking for the 01/08/19 encounter (Office Visit) with Rosetta Posner, MD.    12 system ROS was negative unless otherwise noted in HPI   Observations/Objective: CT scan of his chest from 01/01/2019 was reviewed and discussed.  This shows excellent positioning of his stent graft with no evidence of endoleak.  No evidence of aneurysm sac expansion  Assessment and Plan: Stable with expected follow-up of stent graft repair.  To continue full activities without restriction  Follow Up Instructions:   Follow up 1 year with CT angiogram of chest   I discussed the assessment and treatment plan with the patient. The patient was provided an opportunity to ask questions and all were answered. The patient agreed with the plan and demonstrated an understanding of the instructions.   The patient was advised to call back or seek an in-person evaluation if the symptoms worsen or if the condition fails to improve as anticipated.  I spent 8 minutes with the patient via telephone encounter.   Annamary Rummage [facility] [place] Office: [phone]  01/08/2019, 8:36 AM

## 2019-02-05 ENCOUNTER — Telehealth: Payer: Self-pay | Admitting: Neurology

## 2019-02-05 NOTE — Telephone Encounter (Signed)
Pt wife has called to report pt is doing well, she'd like to know if Dr Jannifer Franklin will allow a tele visit  For a f/u.  Please call

## 2019-02-05 NOTE — Telephone Encounter (Signed)
I returned patient's call and was able to obtain consent for the televisit. I spoke with patient's wife. Patient accepted a 12 pm appointment on 8/27. Patient understands that Dr. Jannifer Franklin will call him for this appointment.

## 2019-02-14 ENCOUNTER — Ambulatory Visit (INDEPENDENT_AMBULATORY_CARE_PROVIDER_SITE_OTHER): Payer: Medicare Other | Admitting: Neurology

## 2019-02-14 ENCOUNTER — Encounter: Payer: Self-pay | Admitting: Neurology

## 2019-02-14 ENCOUNTER — Other Ambulatory Visit: Payer: Self-pay

## 2019-02-14 DIAGNOSIS — R413 Other amnesia: Secondary | ICD-10-CM | POA: Insufficient documentation

## 2019-02-14 HISTORY — DX: Other amnesia: R41.3

## 2019-02-14 MED ORDER — DONEPEZIL HCL 10 MG PO TABS: 10.0000 mg | ORAL_TABLET | Freq: Every day | ORAL | 3 refills | Status: DC

## 2019-02-14 NOTE — Progress Notes (Signed)
     Virtual Visit via Telephone Note  I connected with [name] on 02/14/19 at 12:00 PM EDT by telephone and verified that I am speaking with the correct person using two identifiers.  Location: Patient: The patient is at home. Provider: The physician is in office.   I discussed the limitations, risks, security and privacy concerns of performing an evaluation and management service by telephone and the availability of in person appointments. I also discussed with the patient that there may be a patient responsible charge related to this service. The patient expressed understanding and agreed to proceed.   History of Present Illness: Charles Terry is an 85 year old right-handed white male with a history of a memory disturbance.  He has been on Aricept 10 mg at night.  He has tolerated the medication quite well.  He believes that his memory has improved on the drug.  He recently had an abdominal aortic aneurysm repair in June 2020.  He has done well following this.  He is still operating motor vehicle, he denies any problems with directions.  His wife helps him keep up with medications and appointments and she does the finances.  He sleeps well at night, he has a good energy level during the day.   Observations/Objective: The telephone interview reveals that the patient is alert and cooperative, he is responding to questions appropriately, a [place]-blind evaluation revealed a score of 17/22.  Assessment and Plan: 1.  Memory disturbance  The patient is functioning fairly well on the Aricept, we will continue the medication.  A prescription was sent in.  The patient will follow-up in 6 months.  Follow Up Instructions: 63-month follow-up, may see nurse practitioner.   I discussed the assessment and treatment plan with the patient. The patient was provided an opportunity to ask questions and all were answered. The patient agreed with the plan and demonstrated an understanding of the  instructions.   The patient was advised to call back or seek an in-person evaluation if the symptoms worsen or if the condition fails to improve as anticipated.  I provided 30 minutes of non-face-to-face time during this encounter.   Kathrynn Ducking, MD

## 2019-03-07 ENCOUNTER — Encounter: Payer: Self-pay | Admitting: Cardiology

## 2019-03-07 ENCOUNTER — Telehealth (INDEPENDENT_AMBULATORY_CARE_PROVIDER_SITE_OTHER): Payer: Medicare Other | Admitting: Cardiology

## 2019-03-07 VITALS — Ht 67.0 in | Wt 158.0 lb

## 2019-03-07 DIAGNOSIS — I10 Essential (primary) hypertension: Secondary | ICD-10-CM

## 2019-03-07 DIAGNOSIS — E785 Hyperlipidemia, unspecified: Secondary | ICD-10-CM

## 2019-03-07 DIAGNOSIS — I2510 Atherosclerotic heart disease of native coronary artery without angina pectoris: Secondary | ICD-10-CM

## 2019-03-07 NOTE — Progress Notes (Signed)
Virtual Visit via Telephone Note   This visit type was conducted due to national recommendations for restrictions regarding the COVID-19 Pandemic (e.g. social distancing) in an effort to limit this patient's exposure and mitigate transmission in our community.  Due to his co-morbid illnesses, this patient is at least at moderate risk for complications without adequate follow up.  This format is felt to be most appropriate for this patient at this time.  The patient did not have access to video technology/had technical difficulties with video requiring transitioning to audio format only (telephone).  All issues noted in this document were discussed and addressed.  No physical exam could be performed with this format.  Please refer to the patient's chart for his  consent to telehealth for [facility].   Patient has given verbal permission to conduct this visit via virtual appointment and to bill insurance 03/10/2019 12:08 PM     Evaluation Performed:  Follow-up visit  Date:  03/10/2019   ID:  Charles, Terry 24-Jul-1927, MRN [id number]  Patient Location: Home Provider Location: Home  PCP:  Deland Pretty, MD  Cardiologist:  Glenetta Hew, MD  Electrophysiologist:  None   Chief Complaint:  Post-OP Follow-up.  History of Present Illness:    Charles Terry is a 85 y.o. male with PMH notable for CAD-CABG, AAA (open repair) & TAA (recent TEVAR) who presents via audio/video conferencing for a telehealth visit today.   CAD h/o CABG x5 in 2005 (LIMA-LAD, sequential SVG-OM1-OM 2, rectal SVG-RPDA-LPL) in response to a catheterization showing 100% LAD occlusion, 90% proximal circumflex - 80-90% OM1 as well as totally occluded RCA.   His most recent stress test was in May 2012 which was normal.    Most recent echo was from June 2017.-EF 60 to 65% with no R WMA.  GR 1 DD.  Mild AI, AS.  Mildly increased PA pressure restfully 37 mmHg.  He also has a history of AAA repair with a Hemashield  graft 14 mm x 10 mm in 2006 by Dr. Donnetta Hutching.   TEVAR - (Dr. Donnetta Hutching) June 2020  He also has a history of DVT with IVC filter placement.   Charles Terry was last seen for Pre-Op evaluation (for TEVAR) via Telehealth visit in May, 2020. - Was doing well  No cardiac complaints. _> OK for TEVAR.   He was admitted on November 30, 2018 4 treatment of thoracic aortic aneurysm with TEVAR. (Dr. Donnetta Hutching) (has prior h/o open AAA repair) - was being followed for TAA that was enlarged on routine f/u.   Interval History:  [name] seems to have recovered from his procedure quite well.  He is still doing his usual exercise.  Is doing at least a mile to mile and a half a day of walking, albeit not fast, he does this just about every day and denies any symptoms.  He said the vascular procedure went off about having any issues.  The only thing limited now is some hip pain when walking.  Cardiovascular ROS: no chest pain or dyspnea on exertion negative for - edema, irregular heartbeat, orthopnea, palpitations, paroxysmal nocturnal dyspnea, rapid heart rate, shortness of breath or syncope / near syncope, TIA/amaurosis fugax, claudication  The patient does not have symptoms concerning for COVID-19 infection (fever, chills, cough, or new shortness of breath).  The patient is practicing social distancing.  ROS:  Please see the history of present illness.     No nausea vomiting diarrhea.  No melena, hematochezia,  hematuria or epistaxis.  Does have hip pain  Past Medical History:  Diagnosis Date   Arthritis    BPH (benign prostatic hypertrophy) with urinary obstruction 11/12   surg   Coronary artery disease involving native coronary artery 2005   100% LAD, 90% proximal circumflex followed by 80% OM1, 100% RCA. Right PDA and left DL in a codominant system --> CABG '05. low risk Nuc 5/12   Dementia ([place])    Dyslipidemia    Essential hypertension    H/O transfusion of packed red blood cells MAY 2012   History  of AAA (abdominal aortic aneurysm) repair 2006   AAA R&G Hemasheild 14 mm x 10 mm (Dr. Donnetta Hutching);; CT scan in 2013 shows 4.8 cm thoracic aortic aneurysm - the followed by Dr. Donnetta Hutching   History of blood clots    right lower extremity dvt  after inguinal hernia surgery may 2012--doppler follow up report 04/07/11 at [facility] heart and vaxcular shows resolution of the dvt--but pt is to have IVC filter placed 05/05/11--prior to planned prostate surgery scheduled for 05/09/11.   History of DVT of lower extremity    right lower extremity dvt  after inguinal hernia surgery may 2012--doppler follow up report 04/07/11 at [facility] heart and vaxcular shows resolution of the dvt--but pt is to have IVC filter placed 05/05/11--prior to planned prostate surgery scheduled for 05/09/11.   History of hematuria    Memory disorder 02/14/2019   Renal cyst may 2012   bleeding from right renal cyst /acute renal failure secondary to urinary retention from bph --pt states  the cyst is no longer a problem--pt still has indwelling foley catheter--plans  open  suprapubic prostatectomy on 11/19 with dr. Gaynelle Arabian   S/P CABG x 5 2005    LIMA-LAD, SeqSVG-OM1-OM2, Seq SVG- RPDA-LPL   S/P insertion of IVC (inferior vena caval) filter 05/05/11   prior to prostate surgery   Past Surgical History:  Procedure Laterality Date   2D ECHOCARDIOGRAM  11/02/2010; June 2017   a. EF 45-50%, w/ anterior hypokinesis, otherwise normal; mildly sclerotic aortic valve;; b.EF 60-65%. No regional wall motion abnormality. Grade 1 diastolic dysfunction. Mildly reduced RV function. Mild elevation of pulmonary pressures    AAA RPAIR  03/29/2005   14x80mm Hemashield graft   CARDIAC CATHETERIZATION  12/23/2003   Recommended CABG; 100% LAD, 90% proximal circumflex followed by 80% OM1, 100% RCA. Right PDA and left DL in a codominant system.   CORONARY ARTERY BYPASS GRAFT  12/25/2003   x5, LIMA-LAD, SeqSVG-OM1-OM2, Seq SVG- RPDA-LPL    CYSTOSCOPY N/A 11/30/2018   Procedure: Cystoscopy Flexible;  Surgeon: Festus Aloe, MD;  Location: [place];  Service: Urology;  Laterality: N/A;   HERNIA REPAIR Left    MAY 2012   IVC  11/12   placed prior to prostate surg   PERSANTINE MYOVIEW  10/22/2010   Normal perfusion   PROSTATECTOMY  05/09/2011   Procedure: PROSTATECTOMY SUPRAPUBIC;  Surgeon: Ailene Rud, MD;  Location: [facility];  Service: Urology;  Laterality: N/A;  Open   THORACIC AORTIC ENDOVASCULAR STENT GRAFT Right 11/30/2018   Procedure: THORACIC AORTIC ENDOVASCULAR STENT GRAFT;  Surgeon: Rosetta Posner, MD;  Location: [facility];  Service: Vascular;  Laterality: Right;   TRANSURETHRAL RESECTION OF PROSTATE N/A 12/24/2015   Procedure: TRANSURETHRAL RESECTION OF THE PROSTATE (TURP);  Surgeon: Carolan Clines, MD;  Location: [facility];  Service: Urology;  Laterality: N/A;   VENOUS DOPPLER  04/04/2011   No evidence of thrombus or thrombophlebitis  Current Meds  Medication Sig   Calcium Carbonate (CALTRATE 600 PO) Take 600 mg by mouth 2 (two) times daily.    clopidogrel (PLAVIX) 75 MG tablet Take 1 tablet (75 mg total) by mouth every other day.   donepezil (ARICEPT) 10 MG tablet Take 1 tablet (10 mg total) by mouth at bedtime.   fish oil-omega-3 fatty acids 1000 MG capsule Take 1 g by mouth daily.   losartan (COZAAR) 100 MG tablet Take 1/2 (one-half) tablet by mouth once daily (Patient taking differently: Take 50 mg by mouth daily. )   metoprolol tartrate (LOPRESSOR) 25 MG tablet Take 12.5 mg by mouth 2 (two) times daily.    Multiple Vitamins-Minerals (MULTIVITAMINS THER. W/MINERALS) TABS Take 1 tablet by mouth daily.    simvastatin (ZOCOR) 40 MG tablet Take 20 mg by mouth at bedtime.    tamsulosin (FLOMAX) 0.4 MG CAPS capsule Take 0.4 mg by mouth daily after supper.      Allergies:   Food and Aspirin   Social History   Tobacco Use   Smoking status: Former Smoker    Packs/day: 1.00    Years: 20.00     Pack years: 20.00    Quit date: 12/17/1965    Years since quitting: 53.2   Smokeless tobacco: Never Used  Substance Use Topics   Alcohol use: Yes    Alcohol/week: 2.0 - 3.0 standard drinks    Types: 2 - 3 Standard drinks or equivalent per week    Comment: OCCAS   Drug use: No     Family Hx: The patient's family history includes Alzheimer's disease in his brother; Aneurysm in his father and mother; Dementia in his brother and sister; Heart attack (age of onset: 81) in his brother; Lung cancer in his father; Prostate cancer in his brother.   Prior CV studies:   The following studies were reviewed today:  none  Labs/Other Tests and Data Reviewed:    EKG:  No ECG reviewed.  Recent Labs: followed by PCP (just recently had labs done) 11/27/2018: ALT 19 12/01/2018: BUN 15; Creatinine, Ser 0.87; Hemoglobin 11.8; Platelets 136; Potassium 4.0; Sodium 138   Recent Lipid Panel Lab Results  Component Value Date/Time   CHOL 83 11/20/2015 04:40 AM   TRIG 72 11/20/2015 04:40 AM   HDL 31 (L) 11/20/2015 04:40 AM   CHOLHDL 2.7 11/20/2015 04:40 AM   LDLCALC 38 11/20/2015 04:40 AM    Wt Readings from Last 3 Encounters:  03/07/19 158 lb (71.7 kg)  11/30/18 159 lb (72.1 kg)  11/27/18 159 lb (72.1 kg)     Objective:    Vital Signs:  Ht 5\' 7"  (1.702 m)    Wt 158 lb (71.7 kg)    BMI 24.75 kg/m   VITAL SIGNS:  reviewed RESPIRATORY:  normal respiratory effort, symmetric expansion PSYCH:  normal affect   ASSESSMENT & PLAN:    Problem List Items Addressed This Visit    Atherosclerosis of native coronary artery without angina pectoris - Primary (Chronic)   Dyslipidemia, goal LDL below 70 (Chronic)   Essential hypertension (Chronic)      [place] continues to do well from a cardiac standpoint.  No anginal or CHF symptoms to speak of.  Remains on ARB, statin and Plavix as well as low-dose beta-blocker  His blood pressures have been pretty well controlled on moderate dose losartan and  low-dose metoprolol  His lipids are being monitored by his PCP.  He has been on simvastatin now for years.  Tolerating well.  He has now had open AAA repair and TEVAR for thoracic extension of the AAA that went well.  No claudication symptoms.  Follow-up studies will be per Dr. Curt Jews.  We will continue current medication plan.  F/u 6 months (in person)  COVID-19 Education: The signs and symptoms of COVID-19 were discussed with the patient and how to seek care for testing (follow up with PCP or arrange E-visit).   The importance of social distancing was discussed today.  Time:   Today, I have spent 15 minutes with the patient with telehealth technology discussing the above problems.     Medication Adjustments/Labs and Tests Ordered: Current medicines are reviewed at length with the patient today.  Concerns regarding medicines are outlined above.   Patient Instructions  Medication Instructions:   No changes  If you need a refill on your cardiac medications before your next appointment, please call your pharmacy.   Lab work:  none    Testing/Procedures:  none  Follow-Up: At [facility], you and your health needs are our priority.  As part of our continuing mission to provide you with exceptional heart care, we have created designated Provider Care Teams.  These Care Teams include your primary Cardiologist (physician) and Advanced Practice Providers (APPs -  Physician Assistants and Nurse Practitioners) who all work together to provide you with the care you need, when you need it.  You will need a follow up appointment in 6  Months- March 2021.  Please call our office 2 months in advance to schedule this appointment.  You may see Glenetta Hew, MD   Any Other Special Instructions Will Be Listed Below (If Applicable).      Signed, Glenetta Hew, MD  03/10/2019 12:08 PM    [place]

## 2019-03-07 NOTE — Patient Instructions (Addendum)
Medication Instructions:   No changes  If you need a refill on your cardiac medications before your next appointment, please call your pharmacy.   Lab work:  none    Testing/Procedures:  none  Follow-Up: At [facility], you and your health needs are our priority.  As part of our continuing mission to provide you with exceptional heart care, we have created designated Provider Care Teams.  These Care Teams include your primary Cardiologist (physician) and Advanced Practice Providers (APPs -  Physician Assistants and Nurse Practitioners) who all work together to provide you with the care you need, when you need it. . You will need a follow up appointment in 6  Months- March 2021.  Please call our office 2 months in advance to schedule this appointment.  You may see Glenetta Hew, MD   Any Other Special Instructions Will Be Listed Below (If Applicable).

## 2019-03-10 ENCOUNTER — Encounter: Payer: Self-pay | Admitting: Cardiology

## 2019-03-20 DIAGNOSIS — Z23 Encounter for immunization: Secondary | ICD-10-CM | POA: Diagnosis not present

## 2019-04-04 DIAGNOSIS — R351 Nocturia: Secondary | ICD-10-CM | POA: Diagnosis not present

## 2019-04-04 DIAGNOSIS — R972 Elevated prostate specific antigen [PSA]: Secondary | ICD-10-CM | POA: Diagnosis not present

## 2019-04-04 DIAGNOSIS — N401 Enlarged prostate with lower urinary tract symptoms: Secondary | ICD-10-CM | POA: Diagnosis not present

## 2019-05-09 DIAGNOSIS — Z0120 Encounter for dental examination and cleaning without abnormal findings: Secondary | ICD-10-CM | POA: Diagnosis not present

## 2019-06-19 DIAGNOSIS — I10 Essential (primary) hypertension: Secondary | ICD-10-CM | POA: Diagnosis not present

## 2019-06-19 DIAGNOSIS — E785 Hyperlipidemia, unspecified: Secondary | ICD-10-CM | POA: Diagnosis not present

## 2019-06-19 DIAGNOSIS — E1121 Type 2 diabetes mellitus with diabetic nephropathy: Secondary | ICD-10-CM | POA: Diagnosis not present

## 2019-07-09 DIAGNOSIS — H5703 Miosis: Secondary | ICD-10-CM | POA: Diagnosis not present

## 2019-07-09 DIAGNOSIS — H2513 Age-related nuclear cataract, bilateral: Secondary | ICD-10-CM | POA: Diagnosis not present

## 2019-07-15 ENCOUNTER — Ambulatory Visit: Payer: Medicare Other | Attending: Internal Medicine

## 2019-07-15 DIAGNOSIS — Z23 Encounter for immunization: Secondary | ICD-10-CM | POA: Insufficient documentation

## 2019-07-15 NOTE — Progress Notes (Signed)
   Covid-19 Vaccination Clinic  Name:  Charles Terry    MRN: [id number] DOB: Mar 07, 1928  07/15/2019  Charles Terry was observed post Covid-19 immunization for 15 minutes without incidence. He was provided with Vaccine Information Sheet and instruction to access the V-Safe system.   Charles Terry was instructed to call 911 with any severe reactions post vaccine: [name] Difficulty breathing  . Swelling of your face and throat  . A fast heartbeat  . A bad rash all over your body  . Dizziness and weakness    Immunizations Administered    Name Date Dose VIS Date Route   Pfizer COVID-19 Vaccine 07/15/2019  3:24 PM 0.3 mL 05/31/2019 Intramuscular   Manufacturer: [place]   Lot: [id number]   [place]: [id number]

## 2019-08-05 ENCOUNTER — Ambulatory Visit: Payer: Medicare Other | Attending: Internal Medicine

## 2019-08-05 DIAGNOSIS — Z23 Encounter for immunization: Secondary | ICD-10-CM | POA: Insufficient documentation

## 2019-08-05 NOTE — Progress Notes (Signed)
   Covid-19 Vaccination Clinic  Name:  Charles Terry    MRN: [id number] DOB: Oct 24, 1927  08/05/2019  Mr. Charles Terry was observed post Covid-19 immunization for 15 minutes without incidence. He was provided with Vaccine Information Sheet and instruction to access the V-Safe system.   Mr. Charles Terry was instructed to call 911 with any severe reactions post vaccine: [name] Difficulty breathing  . Swelling of your face and throat  . A fast heartbeat  . A bad rash all over your body  . Dizziness and weakness    Immunizations Administered    Name Date Dose VIS Date Route   Pfizer COVID-19 Vaccine 08/05/2019 12:36 PM 0.3 mL 05/31/2019 Intramuscular   Manufacturer: [place]   Lot: [id number]   [place]: [id number]

## 2019-08-26 ENCOUNTER — Other Ambulatory Visit: Payer: Self-pay

## 2019-08-26 ENCOUNTER — Encounter: Payer: Self-pay | Admitting: Neurology

## 2019-08-26 ENCOUNTER — Ambulatory Visit: Payer: Medicare Other | Admitting: Neurology

## 2019-08-26 VITALS — BP 125/58 | HR 47 | Temp 97.4°F | Ht 67.5 in | Wt 162.0 lb

## 2019-08-26 DIAGNOSIS — R413 Other amnesia: Secondary | ICD-10-CM | POA: Diagnosis not present

## 2019-08-26 MED ORDER — DONEPEZIL HCL 10 MG PO TABS: 10.0000 mg | ORAL_TABLET | Freq: Every day | ORAL | 3 refills | Status: AC

## 2019-08-26 NOTE — Patient Instructions (Signed)
It was great to meet you both today! Memory score was 24/30 Continue taking Aricept See you in 1 year

## 2019-08-26 NOTE — Progress Notes (Signed)
PATIENT: Charles Terry DOB: December 23, 1927  REASON FOR VISIT: follow up HISTORY FROM: patient  HISTORY OF PRESENT ILLNESS: Today 08/26/19  Mr. [name] is a 86 year old male with history of memory disturbance.  He remains on Aricept.  He feels his memory is overall stable.  He is tolerating Aricept without side effect.  He has difficulty remembering the date, day of the week.  His wife manages medications.  He continues to drive, has not gotten lost.  He helps his wife with the day to day activities.  He sleeps well at night, has a good appetite.  He walks every day.  He has not had any falls.  He enjoys playing golf, but has not done this due to the cold weather.  He presents today for evaluation accompanied by his wife.  HISTORY 02/14/2019 Dr. Jannifer Franklin: Charles Terry is an 86 year old right-handed white male with a history of a memory disturbance.  He has been on Aricept 10 mg at night.  He has tolerated the medication quite well.  He believes that his memory has improved on the drug.  He recently had an abdominal aortic aneurysm repair in June 2020.  He has done well following this.  He is still operating motor vehicle, he denies any problems with directions.  His wife helps him keep up with medications and appointments and she does the finances.  He sleeps well at night, he has a good energy level during the day.   REVIEW OF SYSTEMS: Out of a complete 14 system review of symptoms, the patient complains only of the following symptoms, and all other reviewed systems are negative.  Memory loss  ALLERGIES: Allergies  Allergen Reactions  . Food Anaphylaxis    NO KIWI FRUIT   . Aspirin Rash and Other (See Comments)    ANGIOEDEMA    HOME MEDICATIONS: Outpatient Medications Prior to Visit  Medication Sig Dispense Refill  . Calcium Carbonate (CALTRATE 600 PO) Take 600 mg by mouth 2 (two) times daily.     . clopidogrel (PLAVIX) 75 MG tablet Take 1 tablet (75 mg total) by mouth every other  day. 90 tablet 3  . donepezil (ARICEPT) 10 MG tablet Take 1 tablet (10 mg total) by mouth at bedtime. 90 tablet 3  . fish oil-omega-3 fatty acids 1000 MG capsule Take 1 g by mouth daily.    [name] losartan (COZAAR) 100 MG tablet Take 1/2 (one-half) tablet by mouth once daily (Patient taking differently: Take 50 mg by mouth daily. ) 45 tablet 4  . metoprolol tartrate (LOPRESSOR) 25 MG tablet Take 12.5 mg by mouth 2 (two) times daily.     . Multiple Vitamins-Minerals (MULTIVITAMINS THER. W/MINERALS) TABS Take 1 tablet by mouth daily.     . simvastatin (ZOCOR) 40 MG tablet Take 20 mg by mouth at bedtime.     . tamsulosin (FLOMAX) 0.4 MG CAPS capsule Take 0.4 mg by mouth daily after supper.      No facility-administered medications prior to visit.    PAST MEDICAL HISTORY: Past Medical History:  Diagnosis Date  . Arthritis   . BPH (benign prostatic hypertrophy) with urinary obstruction 11/12   surg  . Coronary artery disease involving native coronary artery 2005   100% LAD, 90% proximal circumflex followed by 80% OM1, 100% RCA. Right PDA and left DL in a codominant system --> CABG '05. low risk Nuc 5/12  . Dementia ([place])   . Dyslipidemia   . Essential hypertension   . H/O  transfusion of packed red blood cells MAY 2012  . History of AAA (abdominal aortic aneurysm) repair 2006   AAA R&G Hemasheild 14 mm x 10 mm (Dr. Donnetta Hutching);; CT scan in 2013 shows 4.8 cm thoracic aortic aneurysm - the followed by Dr. Donnetta Hutching  . History of blood clots    right lower extremity dvt  after inguinal hernia surgery may 2012--doppler follow up report 04/07/11 at [facility] heart and vaxcular shows resolution of the dvt--but pt is to have IVC filter placed 05/05/11--prior to planned prostate surgery scheduled for 05/09/11.  [name] History of DVT of lower extremity    right lower extremity dvt  after inguinal hernia surgery may 2012--doppler follow up report 04/07/11 at [facility] heart and vaxcular shows resolution of the  dvt--but pt is to have IVC filter placed 05/05/11--prior to planned prostate surgery scheduled for 05/09/11.  [name] History of hematuria   . Memory disorder 02/14/2019  . Renal cyst may 2012   bleeding from right renal cyst /acute renal failure secondary to urinary retention from bph --pt states  the cyst is no longer a problem--pt still has indwelling foley catheter--plans  open  suprapubic prostatectomy on 11/19 with dr. Gaynelle Arabian  . S/P CABG x 5 2005    LIMA-LAD, SeqSVG-OM1-OM2, Seq SVG- RPDA-LPL  . S/P insertion of IVC (inferior vena caval) filter 05/05/11   prior to prostate surgery    PAST SURGICAL HISTORY: Past Surgical History:  Procedure Laterality Date  . 2D ECHOCARDIOGRAM  11/02/2010; June 2017   a. EF 45-50%, w/ anterior hypokinesis, otherwise normal; mildly sclerotic aortic valve;; b.EF 60-65%. No regional wall motion abnormality. Grade 1 diastolic dysfunction. Mildly reduced RV function. Mild elevation of pulmonary pressures   . AAA RPAIR  03/29/2005   14x40mm Hemashield graft  . CARDIAC CATHETERIZATION  12/23/2003   Recommended CABG; 100% LAD, 90% proximal circumflex followed by 80% OM1, 100% RCA. Right PDA and left DL in a codominant system.  . CORONARY ARTERY BYPASS GRAFT  12/25/2003   x5, LIMA-LAD, SeqSVG-OM1-OM2, Seq SVG- RPDA-LPL  . CYSTOSCOPY N/A 11/30/2018   Procedure: Cystoscopy Flexible;  Surgeon: Festus Aloe, MD;  Location: [place];  Service: Urology;  Laterality: N/A;  . HERNIA REPAIR Left    MAY 2012  . IVC  11/12   placed prior to prostate surg  . PERSANTINE MYOVIEW  10/22/2010   Normal perfusion  . PROSTATECTOMY  05/09/2011   Procedure: PROSTATECTOMY SUPRAPUBIC;  Surgeon: Ailene Rud, MD;  Location: [facility];  Service: Urology;  Laterality: N/A;  Open  . THORACIC AORTIC ENDOVASCULAR STENT GRAFT Right 11/30/2018   Procedure: THORACIC AORTIC ENDOVASCULAR STENT GRAFT;  Surgeon: Rosetta Posner, MD;  Location: [place];  Service: Vascular;  Laterality: Right;  .  TRANSURETHRAL RESECTION OF PROSTATE N/A 12/24/2015   Procedure: TRANSURETHRAL RESECTION OF THE PROSTATE (TURP);  Surgeon: Carolan Clines, MD;  Location: [facility];  Service: Urology;  Laterality: N/A;  . VENOUS DOPPLER  04/04/2011   No evidence of thrombus or thrombophlebitis    FAMILY HISTORY: Family History  Problem Relation Age of Onset  . Aneurysm Mother        Died at age 54  . Aneurysm Father   . Lung cancer Father        Died at age 46  . Dementia Sister   . Prostate cancer Brother   . Heart attack Brother 79  . Dementia Brother   . Alzheimer's disease Brother     SOCIAL HISTORY: Social History  Socioeconomic History  . Marital status: Married    Spouse name: Not on file  . Number of children: 2  . Years of education: college  . Highest education level: Not on file  Occupational History  . Not on file  Tobacco Use  . Smoking status: Former Smoker    Packs/day: 1.00    Years: 20.00    Pack years: 20.00    Quit date: 12/17/1965    Years since quitting: 53.7  . Smokeless tobacco: Never Used  Substance and Sexual Activity  . Alcohol use: Yes    Alcohol/week: 2.0 - 3.0 standard drinks    Types: 2 - 3 Standard drinks or equivalent per week    Comment: OCCAS  . Drug use: No  . Sexual activity: Not on file  Other Topics Concern  . Not on file  Social History Narrative   Pleasant, married father of 2 (one daughter and one son), grandfather 14.    Exercises on a daily basis walking at least 1 mile. .   Does not smoke - quit over 20 years ago. Drinks social alcoholic beverage.   Caffeine use: coffee every morning (1 cup)   Right handed    Social Determinants of Health   Financial Resource Strain:   . Difficulty of Paying Living Expenses: Not on file  Food Insecurity:   . Worried About Charity fundraiser in the Last Year: Not on file  . Ran Out of Food in the Last Year: Not on file  Transportation Needs:   . Lack of Transportation (Medical): Not on file  .  Lack of Transportation (Non-Medical): Not on file  Physical Activity:   . Days of Exercise per Week: Not on file  . Minutes of Exercise per Session: Not on file  Stress:   . Feeling of Stress : Not on file  Social Connections:   . Frequency of Communication with Friends and Family: Not on file  . Frequency of Social Gatherings with Friends and Family: Not on file  . Attends Religious Services: Not on file  . Active Member of Clubs or Organizations: Not on file  . Attends Archivist Meetings: Not on file  . Marital Status: Not on file  Intimate Partner Violence:   . Fear of Current or Ex-Partner: Not on file  . Emotionally Abused: Not on file  . Physically Abused: Not on file  . Sexually Abused: Not on file   PHYSICAL EXAM  Vitals:   08/26/19 1106  BP: (!) 125/58  Pulse: (!) 47  Temp: (!) 97.4 F (36.3 C)  SpO2: 99%  Weight: 162 lb (73.5 kg)  Height: 5' 7.5" (1.715 m)   Body mass index is 25 kg/m.  Generalized: Well developed, in no acute distress  MMSE - Mini Mental State Exam 08/26/2019 02/15/2018  Orientation to time 3 0  Orientation to Place 4 5  Registration 3 3  Attention/ Calculation 4 2  Recall 1 0  Language- name 2 objects 2 2  Language- repeat 1 1  Language- follow 3 step command 3 3  Language- read & follow direction 1 1  Write a sentence 1 1  Copy design 1 1  Total score 24 19    Neurological examination  Mentation: Alert oriented to time, place, history taking. Follows all commands speech and language fluent Cranial nerve II-XII: Pupils were equal round reactive to light. Extraocular movements were full, visual field were full on confrontational test. Facial sensation and strength  were normal. Head turning and shoulder shrug  were normal and symmetric. Motor: Good strength of all extremities Sensory: Sensory testing is intact to soft touch on all 4 extremities. No evidence of extinction is noted.  Coordination: Cerebellar testing reveals good  finger-nose-finger and heel-to-shin bilaterally.  Gait and station: Able to rise from seated position without pushoff, gait is slightly wide-based, no assistive device Reflexes: Deep tendon reflexes are symmetric and normal bilaterally.   DIAGNOSTIC DATA (LABS, IMAGING, TESTING) - I reviewed patient records, labs, notes, testing and imaging myself where available.  Lab Results  Component Value Date   WBC 10.0 12/01/2018   HGB 11.8 (L) 12/01/2018   HCT 35.0 (L) 12/01/2018   MCV 94.1 12/01/2018   PLT 136 (L) 12/01/2018      Component Value Date/Time   NA 138 12/01/2018 0536   K 4.0 12/01/2018 0536   CL 109 12/01/2018 0536   CO2 23 12/01/2018 0536   GLUCOSE 107 (H) 12/01/2018 0536   BUN 15 12/01/2018 0536   CREATININE 0.87 12/01/2018 0536   CALCIUM 8.3 (L) 12/01/2018 0536   PROT 8.4 (H) 11/27/2018 0838   ALBUMIN 3.6 11/27/2018 0838   AST 21 11/27/2018 0838   ALT 19 11/27/2018 0838   ALKPHOS 47 11/27/2018 0838   BILITOT 0.7 11/27/2018 0838   GFRNONAA >60 12/01/2018 0536   GFRAA >60 12/01/2018 0536   Lab Results  Component Value Date   CHOL 83 11/20/2015   HDL 31 (L) 11/20/2015   LDLCALC 38 11/20/2015   TRIG 72 11/20/2015   CHOLHDL 2.7 11/20/2015   Lab Results  Component Value Date   HGBA1C (H) 11/25/2010    6.4 (NOTE)                                                                       According to the ADA Clinical Practice Recommendations for 2011, when HbA1c is used as a screening test:   >=6.5%   Diagnostic of Diabetes Mellitus           (if abnormal result  is confirmed)  5.7-6.4%   Increased risk of developing Diabetes Mellitus  References:Diagnosis and Classification of Diabetes Mellitus,Diabetes [id number] 1):S62-S69 and Standards of Medical Care in         Diabetes - 2011,Diabetes Care,2011,34  (Suppl 1):S11-S61.   Lab Results  Component Value Date   [id number] 02/15/2018   Lab Results  Component Value Date   TSH 2.087 11/18/2010   ASSESSMENT  AND PLAN 86 y.o. year old male  has a past medical history of Arthritis, BPH (benign prostatic hypertrophy) with urinary obstruction (11/12), Coronary artery disease involving native coronary artery (2005), Dementia ([place]), Dyslipidemia, Essential hypertension, H/O transfusion of packed red blood cells (MAY 2012), History of AAA (abdominal aortic aneurysm) repair (2006), History of blood clots, History of DVT of lower extremity, History of hematuria, Memory disorder (02/14/2019), Renal cyst (may 2012), S/P CABG x 5 (2005), and S/P insertion of IVC (inferior vena caval) filter (05/05/11). here with:  1.  Memory disturbance  His memory appears stable.  He will remain on Aricept.  We discussed Namenda, does not want to start at this time.  Review of labs, from initial visit August 2019, showed a  elevated sed rate 56, for unknown reason.  We will recheck this.  He will follow-up here in 1 year or sooner if needed, if doing well, may follow-up with our office on an as-needed basis.   I spent 15 minutes with the patient. 50% of this time was spent discussing his plan of care.   Butler Denmark, AGNP-C, DNP 08/26/2019, 11:16 AM [facility] [address], [place], [place] [address] [phone]

## 2019-08-27 ENCOUNTER — Other Ambulatory Visit (INDEPENDENT_AMBULATORY_CARE_PROVIDER_SITE_OTHER): Payer: Self-pay

## 2019-08-27 DIAGNOSIS — Z0289 Encounter for other administrative examinations: Secondary | ICD-10-CM

## 2019-08-27 DIAGNOSIS — R413 Other amnesia: Secondary | ICD-10-CM | POA: Diagnosis not present

## 2019-08-28 LAB — SEDIMENTATION RATE: Sed Rate: 89 mm/hr — ABNORMAL HIGH (ref 0–30)

## 2019-08-29 NOTE — Progress Notes (Signed)
I have read the note, and I agree with the clinical assessment and plan.  Charles Terry Charles Terry   

## 2019-09-02 ENCOUNTER — Telehealth: Payer: Self-pay | Admitting: Neurology

## 2019-09-02 NOTE — Telephone Encounter (Signed)
I called the patient and talked with his wife. Sed rate was elevated at 89, for unknown reason, no signs of illness or infection. Please send copy to Dr. Shelia Media as Juluis Rainier, to correlate overtime, also send previous sed rate.

## 2019-09-02 NOTE — Telephone Encounter (Signed)
Fax to Dr. Shelia Media, sed rate. [phone].

## 2019-09-05 DIAGNOSIS — R700 Elevated erythrocyte sedimentation rate: Secondary | ICD-10-CM | POA: Diagnosis not present

## 2019-09-05 DIAGNOSIS — R350 Frequency of micturition: Secondary | ICD-10-CM | POA: Diagnosis not present

## 2019-09-05 DIAGNOSIS — M79602 Pain in left arm: Secondary | ICD-10-CM | POA: Diagnosis not present

## 2019-09-10 DIAGNOSIS — Z86718 Personal history of other venous thrombosis and embolism: Secondary | ICD-10-CM | POA: Diagnosis not present

## 2019-09-10 DIAGNOSIS — Z9079 Acquired absence of other genital organ(s): Secondary | ICD-10-CM | POA: Diagnosis not present

## 2019-09-10 DIAGNOSIS — R700 Elevated erythrocyte sedimentation rate: Secondary | ICD-10-CM | POA: Diagnosis not present

## 2019-09-10 DIAGNOSIS — R413 Other amnesia: Secondary | ICD-10-CM | POA: Diagnosis not present

## 2019-09-18 ENCOUNTER — Telehealth: Payer: Self-pay | Admitting: Adult Health

## 2019-09-18 NOTE — Telephone Encounter (Signed)
Received a new pt referral from Dr. Kathlene November for abnormal spep. Charles Terry has been scheduled to see Charles Terry on 4/14 at 1:30pm w/labs at 1pm. Appt date and time has been given to the pt's wife.

## 2019-10-02 ENCOUNTER — Encounter: Payer: Self-pay | Admitting: Adult Health

## 2019-10-02 ENCOUNTER — Other Ambulatory Visit: Payer: Self-pay

## 2019-10-02 ENCOUNTER — Inpatient Hospital Stay: Payer: Medicare Other

## 2019-10-02 ENCOUNTER — Inpatient Hospital Stay: Payer: Medicare Other | Attending: Adult Health | Admitting: Adult Health

## 2019-10-02 VITALS — BP 113/95 | HR 53 | Temp 98.7°F | Resp 18 | Ht 67.5 in | Wt 157.9 lb

## 2019-10-02 DIAGNOSIS — Z8042 Family history of malignant neoplasm of prostate: Secondary | ICD-10-CM | POA: Diagnosis not present

## 2019-10-02 DIAGNOSIS — D472 Monoclonal gammopathy: Secondary | ICD-10-CM | POA: Diagnosis not present

## 2019-10-02 DIAGNOSIS — R700 Elevated erythrocyte sedimentation rate: Secondary | ICD-10-CM | POA: Diagnosis not present

## 2019-10-02 DIAGNOSIS — R778 Other specified abnormalities of plasma proteins: Secondary | ICD-10-CM

## 2019-10-02 DIAGNOSIS — Z87891 Personal history of nicotine dependence: Secondary | ICD-10-CM

## 2019-10-02 DIAGNOSIS — Z801 Family history of malignant neoplasm of trachea, bronchus and lung: Secondary | ICD-10-CM | POA: Insufficient documentation

## 2019-10-02 LAB — CBC WITH DIFFERENTIAL (CANCER CENTER ONLY)
Abs Immature Granulocytes: 0.01 10*3/uL (ref 0.00–0.07)
Basophils Absolute: 0.1 10*3/uL (ref 0.0–0.1)
Basophils Relative: 1 %
Eosinophils Absolute: 0.2 10*3/uL (ref 0.0–0.5)
Eosinophils Relative: 3 %
HCT: 41.9 % (ref 39.0–52.0)
Hemoglobin: 13.6 g/dL (ref 13.0–17.0)
Immature Granulocytes: 0 %
Lymphocytes Relative: 36 %
Lymphs Abs: 2.3 10*3/uL (ref 0.7–4.0)
MCH: 31.6 pg (ref 26.0–34.0)
MCHC: 32.5 g/dL (ref 30.0–36.0)
MCV: 97.4 fL (ref 80.0–100.0)
Monocytes Absolute: 0.4 10*3/uL (ref 0.1–1.0)
Monocytes Relative: 7 %
Neutro Abs: 3.3 10*3/uL (ref 1.7–7.7)
Neutrophils Relative %: 53 %
Platelet Count: 151 10*3/uL (ref 150–400)
RBC: 4.3 MIL/uL (ref 4.22–5.81)
RDW: 14.2 % (ref 11.5–15.5)
WBC Count: 6.3 10*3/uL (ref 4.0–10.5)
nRBC: 0 % (ref 0.0–0.2)

## 2019-10-02 LAB — CMP (CANCER CENTER ONLY)
ALT: 18 U/L (ref 0–44)
AST: 18 U/L (ref 15–41)
Albumin: 3.4 g/dL — ABNORMAL LOW (ref 3.5–5.0)
Alkaline Phosphatase: 44 U/L (ref 38–126)
Anion gap: 7 (ref 5–15)
BUN: 23 mg/dL (ref 8–23)
CO2: 28 mmol/L (ref 22–32)
Calcium: 9.3 mg/dL (ref 8.9–10.3)
Chloride: 107 mmol/L (ref 98–111)
Creatinine: 1.19 mg/dL (ref 0.61–1.24)
GFR, Est AFR Am: >60 mL/min (ref >60)
GFR, Estimated: 54 mL/min — ABNORMAL LOW (ref >60)
Glucose, Bld: 155 mg/dL — ABNORMAL HIGH (ref 70–99)
Potassium: 4.4 mmol/L (ref 3.5–5.1)
Sodium: 142 mmol/L (ref 135–145)
Total Bilirubin: 0.4 mg/dL (ref 0.3–1.2)
Total Protein: 8 g/dL (ref 6.5–8.1)

## 2019-10-02 LAB — RETICULOCYTES
Immature Retic Fract: 21.4 % — ABNORMAL HIGH (ref 2.3–15.9)
RBC.: 4.32 MIL/uL (ref 4.22–5.81)
Retic Count, Absolute: 76.5 10*3/uL (ref 19.0–186.0)
Retic Ct Pct: 1.8 % (ref 0.4–3.1)

## 2019-10-02 LAB — SAVE SMEAR(SSMR), FOR PROVIDER SLIDE REVIEW

## 2019-10-02 NOTE — Progress Notes (Addendum)
[facility]  [phone] [phone]     ID: Charles Terry DOB: Aug 24, 1927  MR#: [id number]  NIO#:[id number]  Patient Care Team: Deland Pretty, MD as PCP - General (Internal Medicine) Leonie Man, MD as PCP - Cardiology (Cardiology) Carolan Clines, MD (Inactive) as Consulting Physician (Urology) Lahoma Rocker, MD as Consulting Physician (Rheumatology) Kathrynn Ducking, MD as Consulting Physician (Neurology) Chauncey Cruel, MD OTHER MD:  CHIEF COMPLAINT: abnormal SPEP  CURRENT TREATMENT: observation   HISTORY OF CURRENT ILLNESS:  Charles Terry was sent to see rheumatologist, Dr. Lahoma Rocker due to an elevated sedimentation rate, for a full work up.  He has had no pain, aches, or any other condition that he is aware of.  Dr. Kathlene November subsequently drew a serum protein electrophoreses which showed an M spike of 1.1 with two peaks in the beta gamma region that represents a monoclonal protein.    Charles Terry does not have anemia, hypercalcemia, increased creatinine, or bony aches/pains.    The patient's subsequent history is as detailed below.  INTERVAL HISTORY: Charles Terry is here today accompanied by his son.  He isn't quite sure what landed him here, and he is concerned.  His wife is a rd.  He has no pain, no night sweats, unintentional weight loss, lymphadneopathy, or anetired nurse and she is out in the lobby waiting.  He notes that he feels well and that he is not fatigued, and does not have any other concerns.     REVIEW OF SYSTEMS: Charles Terry denies any new issues like fever, chills, chest pain, palpitations, cough, shortness of breath, nausea, vomiting, bowel/bladder changes, headaches, or other issues.  He walks about one mile per day with his wife, and does some yard work from time to time.    PAST MEDICAL HISTORY: Past Medical History:  Diagnosis Date   Arthritis    BPH (benign prostatic hypertrophy) with urinary obstruction 11/12   surg   Coronary artery disease involving native coronary artery 2005   100% LAD, 90% proximal circumflex followed by 80% OM1, 100% RCA. Right PDA and left DL in a codominant system --> CABG '05. low risk Nuc 5/12   Dementia ([place])    Dyslipidemia    Essential hypertension    H/O transfusion of packed red blood cells MAY 2012   History of AAA (abdominal aortic aneurysm) repair 2006   AAA R&G Hemasheild 14 mm x 10 mm (Dr. Donnetta Hutching);; CT scan in 2013 shows 4.8 cm thoracic aortic aneurysm - the followed by Dr. Donnetta Hutching   History of blood clots    right lower extremity dvt  after inguinal hernia surgery may 2012--doppler follow up report 04/07/11 at [facility] heart and vaxcular shows resolution of the dvt--but pt is to have IVC filter placed 05/05/11--prior to planned prostate surgery scheduled for 05/09/11.   History of DVT of lower extremity    right lower extremity dvt  after inguinal hernia surgery may 2012--doppler follow up report 04/07/11 at [facility] heart and vaxcular shows resolution of the dvt--but pt is to have IVC filter placed 05/05/11--prior to planned prostate surgery scheduled for 05/09/11.   History of hematuria    Memory disorder 02/14/2019   Renal cyst may 2012   bleeding from right renal cyst /acute renal failure secondary to urinary retention from bph --pt states  the cyst is no longer a problem--pt still has indwelling foley catheter--plans  open  suprapubic prostatectomy on 11/19 with dr. Gaynelle Arabian   S/P CABG x 5 2005  LIMA-LAD, SeqSVG-OM1-OM2, Seq SVG- RPDA-LPL   S/P insertion of IVC (inferior vena caval) filter 05/05/11   prior to prostate surgery    PAST SURGICAL HISTORY: Past Surgical History:  Procedure Laterality Date   2D ECHOCARDIOGRAM  11/02/2010; June 2017   a. EF 45-50%, w/ anterior hypokinesis, otherwise normal; mildly sclerotic aortic valve;; b.EF 60-65%. No regional wall motion abnormality. Grade 1 diastolic dysfunction. Mildly reduced RV  function. Mild elevation of pulmonary pressures    AAA RPAIR  03/29/2005   14x32m Hemashield graft   CARDIAC CATHETERIZATION  12/23/2003   Recommended CABG; 100% LAD, 90% proximal circumflex followed by 80% OM1, 100% RCA. Right PDA and left DL in a codominant system.   CORONARY ARTERY BYPASS GRAFT  12/25/2003   x5, LIMA-LAD, SeqSVG-OM1-OM2, Seq SVG- RPDA-LPL   CYSTOSCOPY N/A 11/30/2018   Procedure: Cystoscopy Flexible;  Surgeon: EFestus Aloe MD;  [facility]  Service: Urology;  Laterality: N/A;   HERNIA REPAIR Left    MAY 2012   IVC  11/12   placed prior to prostate surg   PERSANTINE MYOVIEW  10/22/2010   Normal perfusion   PROSTATECTOMY  05/09/2011   Procedure: PROSTATECTOMY SUPRAPUBIC;  Surgeon: SAilene Rud MD;  Location: [facility];  Service: Urology;  Laterality: N/A;  Open   THORACIC AORTIC ENDOVASCULAR STENT GRAFT Right 11/30/2018   Procedure: THORACIC AORTIC ENDOVASCULAR STENT GRAFT;  Surgeon: ERosetta Posner MD;  [facility];  Service: Vascular;  Laterality: Right;   TRANSURETHRAL RESECTION OF PROSTATE N/A 12/24/2015   Procedure: TRANSURETHRAL RESECTION OF THE PROSTATE (TURP);  Surgeon: SCarolan Clines MD;  Location: [facility];  Service: Urology;  Laterality: N/A;   VENOUS DOPPLER  04/04/2011   No evidence of thrombus or thrombophlebitis    FAMILY HISTORY Family History  Problem Relation Age of Onset   Aneurysm Mother        Died at age 84  Aneurysm Father    Lung cancer Father        Died at age 84  Dementia Sister    Prostate cancer Brother    Heart attack Brother 52  Dementia Brother    Alzheimer's disease Brother       SOCIAL HISTORY: Mar[place] and [place]  He is retired, 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  His son is GGraeme [place]eneesand [place] his daughter is BForensic scientistand she [place]       ADVANCED  DIRECTIVES: His wife is his HCPOA.  He has a living will.   HEALTH MAINTENANCE: Social History   Tobacco Use   Smoking status: Former Smoker    Packs/day: 1.00    Years: 20.00    Pack years: 20.00    Quit date: 12/17/1965    Years since quitting: 53.8   Smokeless tobacco: Never Used  Substance Use Topics   Alcohol use: Yes    Alcohol/week: 2.0 - 3.0 standard drinks    Types: 2 - 3 Standard drinks or equivalent per week    Comment: OCCAS   Drug use: No     Colonoscopy:  PAP:  Bone density:   Allergies  Allergen Reactions   Food Anaphylaxis    NO KIWI FRUIT    Aspirin Rash and Other (See Comments)    ANGIOEDEMA    Current Outpatient Medications  Medication Sig Dispense Refill   Calcium Carbonate (CALTRATE  600 PO) Take 600 mg by mouth 2 (two) times daily.      clopidogrel (PLAVIX) 75 MG tablet Take 1 tablet (75 mg total) by mouth every other day. 90 tablet 3   donepezil (ARICEPT) 10 MG tablet Take 1 tablet (10 mg total) by mouth at bedtime. 90 tablet 3   fish oil-omega-3 fatty acids 1000 MG capsule Take 1 g by mouth daily.     losartan (COZAAR) 100 MG tablet Take 1/2 (one-half) tablet by mouth once daily (Patient taking differently: Take 50 mg by mouth daily. ) 45 tablet 4   metoprolol tartrate (LOPRESSOR) 25 MG tablet Take 12.5 mg by mouth 2 (two) times daily.      Multiple Vitamins-Minerals (MULTIVITAMINS THER. W/MINERALS) TABS Take 1 tablet by mouth daily.      simvastatin (ZOCOR) 40 MG tablet Take 20 mg by mouth at bedtime.      tamsulosin (FLOMAX) 0.4 MG CAPS capsule Take 0.4 mg by mouth daily after supper.      No current facility-administered medications for this visit.    OBJECTIVE:  Vitals:   10/02/19 1330  BP: (!) 113/95  Pulse: (!) 53  Resp: 18  Temp: 98.7 F (37.1 C)  SpO2: 94%     Body mass index is 24.37 kg/m.   Wt Readings from Last 3 Encounters:  10/02/19 157 lb 14.4 oz (71.6 kg)  08/26/19 162 lb (73.5 kg)  03/07/19 158 lb  (71.7 kg)      ECOG FS:1 - Symptomatic but completely ambulatory GENERAL: Patient is a well appearing male in no acute distress HEENT:  Sclerae anicteric.  Mask in place.  Neck is supple.  NODES:  No cervical, supraclavicular, or axillary lymphadenopathy palpated.  BREAST EXAM:  Deferred. LUNGS:  Clear to auscultation bilaterally.  No wheezes or rhonchi. HEART:  Regular rate and rhythm. No murmur appreciated. ABDOMEN:  Soft, nontender.  Positive, normoactive bowel sounds. No organomegaly palpated. MSK:  No focal spinal tenderness to palpation.  EXTREMITIES:  No peripheral edema.   SKIN:  Clear with no obvious rashes or skin changes. No nail dyscrasia. NEURO:  Nonfocal. Well oriented.  Appropriate affect.     LAB RESULTS:  CMP     Component Value Date/Time   NA 142 10/02/2019 1319   K 4.4 10/02/2019 1319   CL 107 10/02/2019 1319   CO2 28 10/02/2019 1319   GLUCOSE 155 (H) 10/02/2019 1319   BUN 23 10/02/2019 1319   CREATININE 1.19 10/02/2019 1319   CALCIUM 9.3 10/02/2019 1319   PROT 8.0 10/02/2019 1319   ALBUMIN 3.4 (L) 10/02/2019 1319   AST 18 10/02/2019 1319   ALT 18 10/02/2019 1319   ALKPHOS 44 10/02/2019 1319   BILITOT 0.4 10/02/2019 1319   GFRNONAA 54 (L) 10/02/2019 1319   GFRAA >60 10/02/2019 1319    Lab Results  Component Value Date   TOTALPROTELP 7.3 10/02/2019     Lab Results  Component Value Date   KPAFRELGTCHN 15.8 10/02/2019   LAMBDASER 41.2 (H) 10/02/2019   KAPLAMBRATIO 0.38 10/02/2019    Lab Results  Component Value Date   WBC 6.3 10/02/2019   NEUTROABS 3.3 10/02/2019   HGB 13.6 10/02/2019   HCT 41.9 10/02/2019   MCV 97.4 10/02/2019   PLT 151 10/02/2019      Chemistry      Component Value Date/Time   NA 142 10/02/2019 1319   K 4.4 10/02/2019 1319   CL 107 10/02/2019 1319   CO2 28 10/02/2019  1319   BUN 23 10/02/2019 1319   CREATININE 1.19 10/02/2019 1319      Component Value Date/Time   CALCIUM 9.3 10/02/2019 1319   ALKPHOS 44  10/02/2019 1319   AST 18 10/02/2019 1319   ALT 18 10/02/2019 1319   BILITOT 0.4 10/02/2019 1319       No results found for: LABCA2  No components found for: NOBSJG283  No results for input(s): INR in the last 168 hours.  No results found for: LABCA2  No results found for: MOQ947  No results found for: MLY650  No results found for: PTW656  No results found for: CA2729  No components found for: HGQUANT  No results found for: CEA1 / No results found for: CEA1   No results found for: AFPTUMOR  No results found for: [place]  No results found for: PSA1  Appointment on 10/02/2019  Component Date Value Ref Range Status   Kappa free light chain 10/02/2019 15.8  3.3 - 19.4 mg/L Final   Lamda free light chains 10/02/2019 41.2* 5.7 - 26.3 mg/L Final   Kappa, lamda light chain ratio 10/02/2019 0.38  0.26 - 1.65 Final   Comment: (NOTE) Performed At: [facility] [place], [place] [id number] Rush Farmer MD FV:[id number]    IgG (Immunoglobin G), Serum 10/02/2019 1,358  603 - 1,613 mg/dL Final   IgA 10/02/2019 73  61 - 437 mg/dL Final   IgM (Immunoglobulin M), Srm 10/02/2019 1,555* 15 - 143 mg/dL Final   Comment: (NOTE) Results confirmed on dilution.    Total Protein ELP 10/02/2019 7.3  6.0 - 8.5 g/dL Corrected   Albumin SerPl Elph-Mcnc 10/02/2019 3.3  2.9 - 4.4 g/dL Corrected   Alpha 1 10/02/2019 0.2  0.0 - 0.4 g/dL Corrected   Alpha2 Glob SerPl Elph-Mcnc 10/02/2019 0.7  0.4 - 1.0 g/dL Corrected   B-Globulin SerPl Elph-Mcnc 10/02/2019 0.8  0.7 - 1.3 g/dL Corrected   Gamma Glob SerPl Elph-Mcnc 10/02/2019 2.2* 0.4 - 1.8 g/dL Corrected   M Protein SerPl Elph-Mcnc 10/02/2019 1.1* Not Observed g/dL Corrected   Comment: An additional m spike was observed at a concentration of 0.8 g/dl    Globulin, Total 10/02/2019 4.0* 2.2 - 3.9 g/dL Corrected   Albumin/Glob SerPl 10/02/2019 0.9  0.7 - 1.7 Corrected   IFE 1 10/02/2019 Comment*   Corrected   Comment: (NOTE) Immunofixation shows IgG monoclonal protein with lambda light chain specificity. Immunofixation shows IgM monoclonal protein with lambda light chain specificity.    Please Note 10/02/2019 Comment   Corrected   Comment: (NOTE) Protein electrophoresis scan will follow via computer, mail, or courier delivery. Performed At: [facility] [place], [place] [id number] Rush Farmer MD JT:[id number]    Beta-2 Microglobulin 10/02/2019 2.3  0.6 - 2.4 mg/L Final   Comment: (NOTE) Siemens Immulite 2000 Immunochemiluminometric assay (ICMA) Values obtained with different assay methods or kits cannot be used interchangeably. Results cannot be interpreted as absolute evidence of the presence or absence of malignant disease. Performed At: [facility] [place], [place] [id number] Rush Farmer MD MA:[id number]    Retic Ct Pct 10/02/2019 1.8  0.4 - 3.1 % Final   RBC. 10/02/2019 4.32  4.22 - 5.81 MIL/uL Final   Retic Count, Absolute 10/02/2019 76.5  19.0 - 186.0 K/uL Final   Immature Retic Fract 10/02/2019 21.4* 2.3 - 15.9 % Final   Performed at [facility], [place] [address], [place], [place] [address]  Sodium 10/02/2019 142  135 - 145 mmol/L Final   Potassium 10/02/2019 4.4  3.5 - 5.1 mmol/L Final   Chloride 10/02/2019 107  98 - 111 mmol/L Final   CO2 10/02/2019 28  22 - 32 mmol/L Final   Glucose, Bld 10/02/2019 155* 70 - 99 mg/dL Final   Glucose reference range applies only to samples taken after fasting for at least 8 hours.   BUN 10/02/2019 23  8 - 23 mg/dL Final   Creatinine 10/02/2019 1.19  0.61 - 1.24 mg/dL Final   Calcium 10/02/2019 9.3  8.9 - 10.3 mg/dL Final   Total Protein 10/02/2019 8.0  6.5 - 8.1 g/dL Final   Albumin 10/02/2019 3.4* 3.5 - 5.0 g/dL Final   AST 10/02/2019 18  15 - 41 U/L Final   ALT 10/02/2019 18  0 - 44 U/L Final   Alkaline Phosphatase 10/02/2019  44  38 - 126 U/L Final   Total Bilirubin 10/02/2019 0.4  0.3 - 1.2 mg/dL Final   GFR, Est Non Af Am 10/02/2019 54* >60 mL/min Final   GFR, Est AFR Am 10/02/2019 >60  >60 mL/min Final   Anion gap 10/02/2019 7  5 - 15 Final   Performed at [facility], [place] [address] [place], [place] [address]   Smear Review 10/02/2019 SMEAR STAINED AND AVAILABLE FOR REVIEW   Final   Performed at [facility], [place] [address] [place], [place] [address]   WBC Count 10/02/2019 6.3  4.0 - 10.5 K/uL Final   RBC 10/02/2019 4.30  4.22 - 5.81 MIL/uL Final   Hemoglobin 10/02/2019 13.6  13.0 - 17.0 g/dL Final   HCT 10/02/2019 41.9  39.0 - 52.0 % Final   MCV 10/02/2019 97.4  80.0 - 100.0 fL Final   MCH 10/02/2019 31.6  26.0 - 34.0 pg Final   MCHC 10/02/2019 32.5  30.0 - 36.0 g/dL Final   RDW 10/02/2019 14.2  11.5 - 15.5 % Final   Platelet Count 10/02/2019 151  150 - 400 K/uL Final   nRBC 10/02/2019 0.0  0.0 - 0.2 % Final   Neutrophils Relative % 10/02/2019 53  % Final   Neutro Abs 10/02/2019 3.3  1.7 - 7.7 K/uL Final   Lymphocytes Relative 10/02/2019 36  % Final   Lymphs Abs 10/02/2019 2.3  0.7 - 4.0 K/uL Final   Monocytes Relative 10/02/2019 7  % Final   Monocytes Absolute 10/02/2019 0.4  0.1 - 1.0 K/uL Final   Eosinophils Relative 10/02/2019 3  % Final   Eosinophils Absolute 10/02/2019 0.2  0.0 - 0.5 K/uL Final   Basophils Relative 10/02/2019 1  % Final   Basophils Absolute 10/02/2019 0.1  0.0 - 0.1 K/uL Final   Immature Granulocytes 10/02/2019 0  % Final   Abs Immature Granulocytes 10/02/2019 0.01  0.00 - 0.07 K/uL Final   Performed at [facility], [place] [address], [place],  [address]    (this displays the last labs from the last 3 days)  Lab Results  Component Value Date   TOTALPROTELP 7.3 10/02/2019   (this displays SPEP labs)  Lab Results  Component Value Date   KPAFRELGTCHN 15.8  10/02/2019   LAMBDASER 41.2 (H) 10/02/2019   KAPLAMBRATIO 0.38 10/02/2019   (kappa/lambda light chains)  No results found for: HGBA, HGBA2QUANT, HGBFQUANT, HGBSQUAN (Hemoglobinopathy evaluation)   No results found for: LDH  No results found for: IRON, TIBC, IRONPCTSAT (Iron and TIBC)  No  results found for: FERRITIN  Urinalysis    Component Value Date/Time   COLORURINE YELLOW 11/27/2018 [place] 11/27/2018 0838   LABSPEC 1.019 11/27/2018 0838   PHURINE 5.0 11/27/2018 0838   GLUCOSEU NEGATIVE 11/27/2018 0838   HGBUR NEGATIVE 11/27/2018 0838   BILIRUBINUR NEGATIVE 11/27/2018 0838   KETONESUR NEGATIVE 11/27/2018 0838   PROTEINUR NEGATIVE 11/27/2018 0838   UROBILINOGEN 0.2 05/10/2011 0725   NITRITE NEGATIVE 11/27/2018 0838   LEUKOCYTESUR NEGATIVE 11/27/2018 0838     STUDIES: No results found.    ASSESSMENT: 86 y.o. man with M spike noted on 09/12/2019 and referred to hematology for evaluation  1. MGUS  (a) M spike noted on 09/12/2019 in beta-gamma region  (b) myeloma panel pending  (c) no hypercalcemia, increased creatinine, anemia, or bone issues noted  PLAN:  Charles Terry met with myself and Dr. Jana Hakim who reviewed with Charles Terry the reason for his visit which is due to the abnormal protein that is in his blood. Dr. Jana Hakim reviewed that antibodies are immune proteins made by cells called plasma cells.  These antibodies protect you from infection. Each person has an army of plasma cells that each make a different antibody.  In Jireh's case, there is an independent clone of cells in immune proteins that is too high.  This causes a spike of protein and can transform into a type of cancer called multiple myeloma.    At this point Charles Terry spike is of uncertain significance.  It is small and has not caused affected his calcium level, creatinine, blood counts, or bones.  This is called Monoclonal Gammopathy of Uncertain Significance.  About 1% of people with  MGUS per year will transform to multiple myeloma.  Due to this, Charles Terry will need to be monitored with lab work every 3 months for a year, every 4 months for a year, then every 6 months for a year.    Dr. Jana Hakim explained everything thoroughly and reassured the patient that he does not have cancer but something to monitor closely.  He and his son appreciated the reassurance.  We will see Charles Terry in f/u in one year with labs every three months.  He was given our contact information, and can call our office for any questions or concerns should they arise.    Total encounter time: 60 minutes*   Charles Bihari, NP   10/08/2019 6:01 PM Medical Oncology and Hematology [facility] [address] [place], [place] [address] Tel. [phone]    Fax. [phone]   ADDENDUM: This 86 y/o Charles Terry man was found to have a small M-spike in course of rheumatology workup.  We obtained additional work-up on 10/02/2019 and this showed no anemia, no hypercalcemia, and no significant renal insufficiency.  Beta-2 microglobulin was in the normal range.  The kappa lambda light chain ratio was normal and a repeat M spike was 1.1 g/dL.  The quantitative immunoglobulins showed a normal IgG and IgA but a high IgM at 1.555 g/dL.  This is monoclonal with lambda light chain specificity.  There is also a small IgG monoclonal protein also with lambda light chain specificity.  While the working diagnosis of MGUS is still appropriate, the fact that we are dealing primarily with an IgM clonal process does raise the risk of transformation to myeloma (2 about 2 %/year).  There is also some increased risk of AL amyloidosis and marginal zone lymphoma.  The plan however is to follow closely for the first 2 years and  then at increasing intervals thereafter.  No treatment is indicated at this point and in many cases observation alone is all that is necessary, especially considering the patient's advanced age.  We  have set Charles Terry up for labs in July, October, January, and then labs and a visit with me in a year.  I personally saw this patient and performed a substantive portion of this encounter with the listed APP documented above.   Chauncey Cruel, MD Medical Oncology and Hematology [facility] [address] [place], [place] [address] Tel. [phone]    Fax. [phone]    *Total Encounter Time as defined by the Centers for Medicare and Medicaid Services includes, in addition to the face-to-face time of a patient visit (documented in the note above) non-face-to-face time: obtaining and reviewing outside history, ordering and reviewing medications, tests or procedures, care coordination (communications with other health care professionals or caregivers) and documentation in the medical record.

## 2019-10-03 ENCOUNTER — Telehealth: Payer: Self-pay | Admitting: Adult Health

## 2019-10-03 ENCOUNTER — Encounter: Payer: Self-pay | Admitting: Adult Health

## 2019-10-03 DIAGNOSIS — D472 Monoclonal gammopathy: Secondary | ICD-10-CM | POA: Insufficient documentation

## 2019-10-03 LAB — KAPPA/LAMBDA LIGHT CHAINS
Kappa free light chain: 15.8 mg/L (ref 3.3–19.4)
Kappa, lambda light chain ratio: 0.38 (ref 0.26–1.65)
Lambda free light chains: 41.2 mg/L — ABNORMAL HIGH (ref 5.7–26.3)

## 2019-10-03 NOTE — Telephone Encounter (Signed)
Scheduled appt per 4/15 schmessage - unable to reach pt and unable to leave vmail . Mailed letter with appts

## 2019-10-03 NOTE — Telephone Encounter (Signed)
No 4/14 los. No changes made to pt's schedule.  

## 2019-10-04 LAB — BETA 2 MICROGLOBULIN, SERUM: Beta-2 Microglobulin: 2.3 mg/L (ref 0.6–2.4)

## 2019-10-08 LAB — MULTIPLE MYELOMA PANEL, SERUM
Albumin SerPl Elph-Mcnc: 3.3 g/dL (ref 2.9–4.4)
Albumin/Glob SerPl: 0.9 (ref 0.7–1.7)
Alpha 1: 0.2 g/dL (ref 0.0–0.4)
Alpha2 Glob SerPl Elph-Mcnc: 0.7 g/dL (ref 0.4–1.0)
B-Globulin SerPl Elph-Mcnc: 0.8 g/dL (ref 0.7–1.3)
Gamma Glob SerPl Elph-Mcnc: 2.2 g/dL — ABNORMAL HIGH (ref 0.4–1.8)
Globulin, Total: 4 g/dL — ABNORMAL HIGH (ref 2.2–3.9)
IgA: 73 mg/dL (ref 61–437)
IgG (Immunoglobin G), Serum: 1358 mg/dL (ref 603–1613)
IgM (Immunoglobulin M), Srm: 1555 mg/dL — ABNORMAL HIGH (ref 15–143)
M Protein SerPl Elph-Mcnc: 1.1 g/dL — ABNORMAL HIGH
Total Protein ELP: 7.3 g/dL (ref 6.0–8.5)

## 2019-10-23 ENCOUNTER — Telehealth: Payer: Self-pay | Admitting: Cardiology

## 2019-10-23 NOTE — Telephone Encounter (Signed)
The patient's wife would like to switch the appointment to virtual on 5/24 with Dr. Ellyn Hack. She has been advised that she may need to change the appointment to a "virtual" day instead.

## 2019-10-23 NOTE — Telephone Encounter (Signed)
Spoke with wife .  Appointment was moved to virtual day 11/14/19 .  Patient and wife are aware. Would like to telephone only visit . Instruction given about vitals and medication  . Verbalized understanding.

## 2019-10-23 NOTE — Telephone Encounter (Signed)
Patient's wife Izora Gala would like to know if his upcoming appt on 5/24 can be changed to a virtual visit. Spouse states he is doing fine, so they would like for it to be over the phone.  States he is getting a physical next month.

## 2019-11-11 ENCOUNTER — Ambulatory Visit: Payer: Medicare Other | Admitting: Cardiology

## 2019-11-14 ENCOUNTER — Telehealth (INDEPENDENT_AMBULATORY_CARE_PROVIDER_SITE_OTHER): Payer: Medicare Other | Admitting: Cardiology

## 2019-11-14 ENCOUNTER — Telehealth: Payer: Self-pay | Admitting: *Deleted

## 2019-11-14 ENCOUNTER — Encounter: Payer: Self-pay | Admitting: Cardiology

## 2019-11-14 VITALS — BP 118/63 | HR 51 | Ht 67.0 in | Wt 158.0 lb

## 2019-11-14 DIAGNOSIS — I2510 Atherosclerotic heart disease of native coronary artery without angina pectoris: Secondary | ICD-10-CM

## 2019-11-14 DIAGNOSIS — I10 Essential (primary) hypertension: Secondary | ICD-10-CM | POA: Diagnosis not present

## 2019-11-14 DIAGNOSIS — E785 Hyperlipidemia, unspecified: Secondary | ICD-10-CM

## 2019-11-14 DIAGNOSIS — Z951 Presence of aortocoronary bypass graft: Secondary | ICD-10-CM

## 2019-11-14 NOTE — Patient Instructions (Addendum)
Medication Instructions:  None *If you need a refill on your cardiac medications before your next appointment, please call your pharmacy*   Lab Work:  Continue to follow-up with Dr. Shelia Media for cholesterol level checks    Testing/Procedures: None   Follow-Up: At [facility], you and your health needs are our priority.  As part of our continuing mission to provide you with exceptional heart care, we have created designated Provider Care Teams.  These Care Teams include your primary Cardiologist (physician) and Advanced Practice Providers (APPs -  Physician Assistants and Nurse Practitioners) who all work together to provide you with the care you need, when you need it.  We recommend signing up for the patient portal called "MyChart".  Sign up information is provided on this After Visit Summary.  MyChart is used to connect with patients for Virtual Visits (Telemedicine).  Patients are able to view lab/test results, encounter notes, upcoming appointments, etc.  Non-urgent messages can be sent to your provider as well.   To learn more about what you can do with MyChart, go to [email or web address].    Your next appointment:   6 month(s)  The format for your next appointment:   In Person  Provider:   Dr. Ellyn Hack   Other Instructions No new instructions

## 2019-11-14 NOTE — Telephone Encounter (Signed)
RN spoke to patient and wife. Instruction were given  from today's virtual visit 11/14/19 .  AVS SUMMARY has been mailed.  Appointment schedule for 6 month.   Patient and wife verbalized understanding

## 2019-11-14 NOTE — Progress Notes (Signed)
Virtual Visit via Telephone Note   This visit type was conducted due to national recommendations for restrictions regarding the COVID-19 Pandemic (e.g. social distancing) in an effort to limit this patient's exposure and mitigate transmission in our community.  Due to his co-morbid illnesses, this patient is at least at moderate risk for complications without adequate follow up.  This format is felt to be most appropriate for this patient at this time.  The patient did not have access to video technology/had technical difficulties with video requiring transitioning to audio format only (telephone).  All issues noted in this document were discussed and addressed.  No physical exam could be performed with this format.  Please refer to the patient's chart for his  consent to telehealth for [facility].   Patient has given verbal permission to conduct this visit via virtual appointment and to bill insurance 11/17/2019 10:18 PM     Evaluation Performed:  Follow-up visit  Date:  11/17/2019   ID:  Charles Terry, DOB Jul 28, 1927, MRN [id number]  Patient Location: Home Provider Location: Other:  Hospital Office  PCP:  Deland Pretty, MD  Cardiologist:  Glenetta Hew, MD  Electrophysiologist:  None   Chief Complaint:   Chief Complaint  Patient presents with  . Follow-up    6 months  . Coronary Artery Disease    Status post CABG.-No angina.     History of Present Illness:    Charles Terry is a 86 y.o. male with PMH notable for CAD-CABG, AAA (open repair), TAA (recent TEVAR) who presents via audio/video conferencing for a telehealth visit today for delayed 59-month follow-up.Charles Terry   CAD h/o CABG x5 in 2005 (LIMA-LAD, sequential SVG-OM1-OM 2, rectal SVG-RPDA-LPL) in response to a catheterization showing 100% LAD occlusion, 90% proximal circumflex - 80-90% OM1 as well as totally occluded RCA.   His most recent stress test was in May 2012 which was normal.  Most recent echo was from June  2017.-EF 60 to 65% with noR WMA. GR 1 DD. Mild AI, AS.Mildly increasedPA pressure restfully 37 mmHg.  He also has a history of AAA repair with a Hemashield graft14 mm x 10 mmin 2006 by Dr. Donnetta Hutching.   TEVAR - (Dr. Donnetta Hutching) June 2020  He also has a history of DVT with IVC filter placement.  Charles Terry was last seen via telemedicine visit in September 2020 for hospital follow-up after recent TEVAR repair by Dr. Donnetta Hutching.  Had recovered well from the procedure.  Back to his usual exercise of walking a mile to a mile and half a day.  Not very fast, but doing fine.  Hospitalizations:  . None   Recent - Interim CV studies:   The following studies were reviewed today: . None  Inerval History   Charles Terry says that he is dong well.  Walks at least a mile a day every day.  Just returned from the beach where he did lots of walking, and denied any chest tightness or pressure.  No real cardiac symptoms..  Cardiovascular ROS: no chest pain or dyspnea on exertion negative for - edema, irregular heartbeat, orthopnea, palpitations, paroxysmal nocturnal dyspnea, rapid heart rate, shortness of breath or syncope / near syncope, TIA/amaurosis fugax, claudication  Recent SPEP - MGUS Dx.  Recenty seen by Neurology - doing OK.  ROS:  Please see the history of present illness.    The patient does not have symptoms concerning for COVID-19 infection (fever, chills, cough, or new shortness of breath).  Review of Systems  Constitutional: Negative for malaise/fatigue.  HENT: Positive for hearing loss. Negative for nosebleeds.   Respiratory: Negative for shortness of breath.   Gastrointestinal: Negative for blood in stool and melena.  Genitourinary: Negative for hematuria.  Musculoskeletal: Positive for joint pain (hips do not really bother him).  Neurological: Negative for dizziness, focal weakness and headaches.  Psychiatric/Behavioral: Negative.  Negative for depression and memory loss. The  patient is not nervous/anxious and does not have insomnia.     Hips are doing quite well.  Only issue is Hard of Hearing  The patient is practicing social distancing. ++ both vaccine injections -both he & his wife.  Past Medical History:  Diagnosis Date  . Arthritis   . BPH (benign prostatic hypertrophy) with urinary obstruction 11/12   surg  . Coronary artery disease involving native coronary artery 2005   100% LAD, 90% proximal circumflex followed by 80% OM1, 100% RCA. Right PDA and left DL in a codominant system --> CABG '05. low risk Nuc 5/12  . Dementia ([place])   . Dyslipidemia   . Essential hypertension   . H/O transfusion of packed red blood cells MAY 2012  . History of AAA (abdominal aortic aneurysm) repair 2006   AAA R&G Hemasheild 14 mm x 10 mm (Dr. Donnetta Hutching);; CT scan in 2013 shows 4.8 cm thoracic aortic aneurysm - the followed by Dr. Donnetta Hutching  . History of blood clots    right lower extremity dvt  after inguinal hernia surgery may 2012--doppler follow up report 04/07/11 at [facility] heart and vaxcular shows resolution of the dvt--but pt is to have IVC filter placed 05/05/11--prior to planned prostate surgery scheduled for 05/09/11.  Charles Terry History of DVT of lower extremity    right lower extremity dvt  after inguinal hernia surgery may 2012--doppler follow up report 04/07/11 at [facility] heart and vaxcular shows resolution of the dvt--but pt is to have IVC filter placed 05/05/11--prior to planned prostate surgery scheduled for 05/09/11.  Charles Terry History of hematuria   . Memory disorder 02/14/2019  . Renal cyst may 2012   bleeding from right renal cyst /acute renal failure secondary to urinary retention from bph --pt states  the cyst is no longer a problem--pt still has indwelling foley catheter--plans  open  suprapubic prostatectomy on 11/19 with dr. Gaynelle Arabian  . S/P CABG x 5 2005    LIMA-LAD, SeqSVG-OM1-OM2, Seq SVG- RPDA-LPL  . S/P insertion of IVC (inferior vena caval) filter  05/05/11   prior to prostate surgery   Past Surgical History:  Procedure Laterality Date  . 2D ECHOCARDIOGRAM  11/02/2010; June 2017   a. EF 45-50%, w/ anterior hypokinesis, otherwise normal; mildly sclerotic aortic valve;; b.EF 60-65%. No regional wall motion abnormality. Grade 1 diastolic dysfunction. Mildly reduced RV function. Mild elevation of pulmonary pressures   . AAA RPAIR  03/29/2005   14x22mm Hemashield graft  . CARDIAC CATHETERIZATION  12/23/2003   Recommended CABG; 100% LAD, 90% proximal circumflex followed by 80% OM1, 100% RCA. Right PDA and left DL in a codominant system.  . CORONARY ARTERY BYPASS GRAFT  12/25/2003   x5, LIMA-LAD, SeqSVG-OM1-OM2, Seq SVG- RPDA-LPL  . CYSTOSCOPY N/A 11/30/2018   Procedure: Cystoscopy Flexible;  Surgeon: Festus Aloe, MD;  Location: [facility];  Service: Urology;  Laterality: N/A;  . HERNIA REPAIR Left    MAY 2012  . IVC  11/12   placed prior to prostate surg  . PERSANTINE MYOVIEW  10/22/2010   Normal perfusion  . PROSTATECTOMY  05/09/2011   Procedure: PROSTATECTOMY SUPRAPUBIC;  Surgeon: Ailene Rud, MD;  Location: [facility];  Service: Urology;  Laterality: N/A;  Open  . THORACIC AORTIC ENDOVASCULAR STENT GRAFT Right 11/30/2018   Procedure: THORACIC AORTIC ENDOVASCULAR STENT GRAFT;  Surgeon: Rosetta Posner, MD;  Location: [facility];  Service: Vascular;  Laterality: Right;  . TRANSURETHRAL RESECTION OF PROSTATE N/A 12/24/2015   Procedure: TRANSURETHRAL RESECTION OF THE PROSTATE (TURP);  Surgeon: Carolan Clines, MD;  Location: [facility];  Service: Urology;  Laterality: N/A;  . VENOUS DOPPLER  04/04/2011   No evidence of thrombus or thrombophlebitis     Current Meds  Medication Sig  . Calcium Carbonate (CALTRATE 600 PO) Take 600 mg by mouth 2 (two) times daily.   . Cholecalciferol (D3-1000 PO) Take 1,000 mg by mouth daily.  . clopidogrel (PLAVIX) 75 MG tablet Take 1 tablet (75 mg total) by mouth every other day.  . donepezil (ARICEPT) 10 MG  tablet Take 1 tablet (10 mg total) by mouth at bedtime.  . fish oil-omega-3 fatty acids 1000 MG capsule Take 1 g by mouth daily.  Charles Terry losartan (COZAAR) 100 MG tablet Take 1/2 (one-half) tablet by mouth once daily (Patient taking differently: Take 50 mg by mouth daily. )  . metoprolol tartrate (LOPRESSOR) 25 MG tablet Take 12.5 mg by mouth 2 (two) times daily.   . Multiple Vitamins-Minerals (MULTIVITAMINS THER. W/MINERALS) TABS Take 1 tablet by mouth daily.   . simvastatin (ZOCOR) 40 MG tablet Take 20 mg by mouth at bedtime.   . tamsulosin (FLOMAX) 0.4 MG CAPS capsule Take 0.4 mg by mouth daily after supper.      Allergies:   Food and Aspirin   Social History   Tobacco Use  . Smoking status: Former Smoker    Packs/day: 1.00    Years: 20.00    Pack years: 20.00    Quit date: 12/17/1965    Years since quitting: 53.9  . Smokeless tobacco: Never Used  Substance Use Topics  . Alcohol use: Yes    Alcohol/week: 2.0 - 3.0 standard drinks    Types: 2 - 3 Standard drinks or equivalent per week    Comment: OCCAS  . Drug use: No     Family Hx: The patient's family history includes Alzheimer's disease in his brother; Aneurysm in his father and mother; Dementia in his brother and sister; Heart attack (age of onset: 21) in his brother; Lung cancer in his father; Prostate cancer in his brother.   Labs/Other Tests and Data Reviewed:    EKG:  No ECG reviewed.  Recent Labs: 10/02/2019: ALT 18; BUN 23; Creatinine 1.19; Hemoglobin 13.6; Platelet Count 151; Potassium 4.4; Sodium 142   Recent Lipid Panel - checked by Dr. Shelia Media - checked relatively recently Lab Results  Component Value Date/Time   CHOL 83 11/20/2015 04:40 AM   TRIG 72 11/20/2015 04:40 AM   HDL 31 (L) 11/20/2015 04:40 AM   CHOLHDL 2.7 11/20/2015 04:40 AM   LDLCALC 38 11/20/2015 04:40 AM    Wt Readings from Last 3 Encounters:  11/14/19 158 lb (71.7 kg)  10/02/19 157 lb 14.4 oz (71.6 kg)  08/26/19 162 lb (73.5 kg)      Objective:    Vital Signs:  BP 118/63   Pulse (!) 51   Ht 5\' 7"  (1.702 m)   Wt 158 lb (71.7 kg)   BMI 24.75 kg/m   VITAL SIGNS:  reviewed Pleasant elderlymale in no acute distress. A&O x 3.  normal Mood & Affect Non-labored respirations   ASSESSMENT & PLAN:    Problem List Items Addressed This Visit    Atherosclerosis of native coronary artery without angina pectoris - Primary (Chronic)   Dyslipidemia, goal LDL below 70 (Chronic)   S/P CABG x 5 (Chronic)   Essential hypertension (Chronic)     As was the case last visit, Kamen continues to do very well from a cardiac standpoint.  No angina or heart failure symptoms to speak of.     He is on a stable regimen of ARB, statin and Plavix along with low-dose beta-blocker with well-controlled blood pressures.  Lipids are being followed by PCP, tolerating similar dose of simvastatin for years.  Blood pressures well controlled.  No angina or heart failure.  He will continue to follow-up with Dr. Donnetta Hutching for his AAA repair with TEVAR.   Continue current medication with no changes in plan. Continue to recommend staying active and exercising.  F/u 6 months (in person)   COVID-19 Education: The signs and symptoms of COVID-19 were discussed with the patient and how to seek care for testing (follow up with PCP or arrange E-visit).   The importance of social distancing was discussed today.  Time:   Today, I have spent 18 minutes with the patient with telehealth technology discussing the above problems.   6 minutes spent charting.  24 total minutes   Medication Adjustments/Labs and Tests Ordered: Current medicines are reviewed at length with the patient today.  Concerns regarding medicines are outlined above.   Patient Instructions  Medication Instructions:  None *If you need a refill on your cardiac medications before your next appointment, please call your pharmacy*   Lab Work:  Continue to follow-up with Dr. Shelia Media for  cholesterol level checks    Testing/Procedures: None   Follow-Up: At [facility], you and your health needs are our priority.  As part of our continuing mission to provide you with exceptional heart care, we have created designated Provider Care Teams.  These Care Teams include your primary Cardiologist (physician) and Advanced Practice Providers (APPs -  Physician Assistants and Nurse Practitioners) who all work together to provide you with the care you need, when you need it.  We recommend signing up for the patient portal called "MyChart".  Sign up information is provided on this After Visit Summary.  MyChart is used to connect with patients for Virtual Visits (Telemedicine).  Patients are able to view lab/test results, encounter notes, upcoming appointments, etc.  Non-urgent messages can be sent to your provider as well.   To learn more about what you can do with MyChart, go to [email or web address].    Your next appointment:   6 month(s)  The format for your next appointment:   In Person  Provider:   Dr. Ellyn Hack   Other Instructions No new instructions     Signed, Glenetta Hew, MD  11/17/2019 10:18 PM    [place]

## 2019-11-14 NOTE — Telephone Encounter (Signed)
  Patient Consent for Virtual Visit         Charles Terry has provided verbal consent on 11/14/2019 for a virtual visit (video or telephone).   CONSENT FOR VIRTUAL VISIT FOR:  Charles Terry  By participating in this virtual visit I agree to the following:  I hereby voluntarily request, consent and authorize [place] and its employed or contracted physicians, physician assistants, nurse practitioners or other licensed health care professionals (the Practitioner), to provide me with telemedicine health care services (the "Services") as deemed necessary by the treating Practitioner. I acknowledge and consent to receive the Services by the Practitioner via telemedicine. I understand that the telemedicine visit will involve communicating with the Practitioner through live audiovisual communication technology and the disclosure of certain medical information by electronic transmission. I acknowledge that I have been given the opportunity to request an in-person assessment or other available alternative prior to the telemedicine visit and am voluntarily participating in the telemedicine visit.  I understand that I have the right to withhold or withdraw my consent to the use of telemedicine in the course of my care at any time, without affecting my right to future care or treatment, and that the Practitioner or I may terminate the telemedicine visit at any time. I understand that I have the right to inspect all information obtained and/or recorded in the course of the telemedicine visit and may receive copies of available information for a reasonable fee.  I understand that some of the potential risks of receiving the Services via telemedicine include:  [name] Delay or interruption in medical evaluation due to technological equipment failure or disruption; . Information transmitted may not be sufficient (e.g. poor resolution of images) to allow for appropriate medical decision making by the  Practitioner; and/or  . In rare instances, security protocols could fail, causing a breach of personal health information.  Furthermore, I acknowledge that it is my responsibility to provide information about my medical history, conditions and care that is complete and accurate to the best of my ability. I acknowledge that Practitioner's advice, recommendations, and/or decision may be based on factors not within their control, such as incomplete or inaccurate data provided by me or distortions of diagnostic images or specimens that may result from electronic transmissions. I understand that the practice of medicine is not an exact science and that Practitioner makes no warranties or guarantees regarding treatment outcomes. I acknowledge that a copy of this consent can be made available to me via my patient portal ([place]), or I can request a printed copy by calling the office of [facility].    I understand that my insurance will be billed for this visit.   I have read or had this consent read to me. . I understand the contents of this consent, which adequately explains the benefits and risks of the Services being provided via telemedicine.  . I have been provided ample opportunity to ask questions regarding this consent and the Services and have had my questions answered to my satisfaction. . I give my informed consent for the services to be provided through the use of telemedicine in my medical care

## 2019-11-17 ENCOUNTER — Encounter: Payer: Self-pay | Admitting: Cardiology

## 2019-12-10 ENCOUNTER — Other Ambulatory Visit: Payer: Self-pay | Admitting: Cardiology

## 2019-12-24 ENCOUNTER — Other Ambulatory Visit: Payer: Self-pay

## 2019-12-24 DIAGNOSIS — E785 Hyperlipidemia, unspecified: Secondary | ICD-10-CM | POA: Diagnosis not present

## 2019-12-24 DIAGNOSIS — I712 Thoracic aortic aneurysm, without rupture, unspecified: Secondary | ICD-10-CM

## 2019-12-24 DIAGNOSIS — I714 Abdominal aortic aneurysm, without rupture, unspecified: Secondary | ICD-10-CM

## 2019-12-24 DIAGNOSIS — I10 Essential (primary) hypertension: Secondary | ICD-10-CM | POA: Diagnosis not present

## 2019-12-26 DIAGNOSIS — I70209 Unspecified atherosclerosis of native arteries of extremities, unspecified extremity: Secondary | ICD-10-CM | POA: Diagnosis not present

## 2019-12-26 DIAGNOSIS — Z0001 Encounter for general adult medical examination with abnormal findings: Secondary | ICD-10-CM | POA: Diagnosis not present

## 2019-12-26 DIAGNOSIS — I2510 Atherosclerotic heart disease of native coronary artery without angina pectoris: Secondary | ICD-10-CM | POA: Diagnosis not present

## 2019-12-26 DIAGNOSIS — E1121 Type 2 diabetes mellitus with diabetic nephropathy: Secondary | ICD-10-CM | POA: Diagnosis not present

## 2020-01-02 ENCOUNTER — Other Ambulatory Visit: Payer: Self-pay

## 2020-01-02 ENCOUNTER — Other Ambulatory Visit: Payer: Self-pay | Admitting: Oncology

## 2020-01-02 ENCOUNTER — Inpatient Hospital Stay: Payer: Medicare Other | Attending: Adult Health

## 2020-01-02 DIAGNOSIS — D472 Monoclonal gammopathy: Secondary | ICD-10-CM | POA: Diagnosis not present

## 2020-01-02 DIAGNOSIS — R700 Elevated erythrocyte sedimentation rate: Secondary | ICD-10-CM | POA: Diagnosis not present

## 2020-01-02 DIAGNOSIS — R778 Other specified abnormalities of plasma proteins: Secondary | ICD-10-CM

## 2020-01-02 LAB — CMP (CANCER CENTER ONLY)
ALT: 11 U/L (ref 0–44)
AST: 17 U/L (ref 15–41)
Albumin: 3.4 g/dL — ABNORMAL LOW (ref 3.5–5.0)
Alkaline Phosphatase: 48 U/L (ref 38–126)
Anion gap: 8 (ref 5–15)
BUN: 22 mg/dL (ref 8–23)
CO2: 27 mmol/L (ref 22–32)
Calcium: 10 mg/dL (ref 8.9–10.3)
Chloride: 105 mmol/L (ref 98–111)
Creatinine: 1.17 mg/dL (ref 0.61–1.24)
GFR, Est AFR Am: >60 mL/min (ref >60)
GFR, Estimated: 55 mL/min — ABNORMAL LOW (ref >60)
Glucose, Bld: 130 mg/dL — ABNORMAL HIGH (ref 70–99)
Potassium: 4.4 mmol/L (ref 3.5–5.1)
Sodium: 140 mmol/L (ref 135–145)
Total Bilirubin: 0.5 mg/dL (ref 0.3–1.2)
Total Protein: 8.3 g/dL — ABNORMAL HIGH (ref 6.5–8.1)

## 2020-01-02 LAB — CBC WITH DIFFERENTIAL (CANCER CENTER ONLY)
Abs Immature Granulocytes: 0.02 10*3/uL (ref 0.00–0.07)
Basophils Absolute: 0.1 10*3/uL (ref 0.0–0.1)
Basophils Relative: 1 %
Eosinophils Absolute: 0.3 10*3/uL (ref 0.0–0.5)
Eosinophils Relative: 4 %
HCT: 41.6 % (ref 39.0–52.0)
Hemoglobin: 13.8 g/dL (ref 13.0–17.0)
Immature Granulocytes: 0 %
Lymphocytes Relative: 29 %
Lymphs Abs: 2.1 10*3/uL (ref 0.7–4.0)
MCH: 32 pg (ref 26.0–34.0)
MCHC: 33.2 g/dL (ref 30.0–36.0)
MCV: 96.5 fL (ref 80.0–100.0)
Monocytes Absolute: 0.6 10*3/uL (ref 0.1–1.0)
Monocytes Relative: 8 %
Neutro Abs: 4.1 10*3/uL (ref 1.7–7.7)
Neutrophils Relative %: 58 %
Platelet Count: 151 10*3/uL (ref 150–400)
RBC: 4.31 MIL/uL (ref 4.22–5.81)
RDW: 13.6 % (ref 11.5–15.5)
WBC Count: 7.1 10*3/uL (ref 4.0–10.5)
nRBC: 0 % (ref 0.0–0.2)

## 2020-01-03 LAB — KAPPA/LAMBDA LIGHT CHAINS
Kappa free light chain: 14.9 mg/L (ref 3.3–19.4)
Kappa, lambda light chain ratio: 0.33 (ref 0.26–1.65)
Lambda free light chains: 45.2 mg/L — ABNORMAL HIGH (ref 5.7–26.3)

## 2020-01-06 LAB — MULTIPLE MYELOMA PANEL, SERUM
Albumin SerPl Elph-Mcnc: 3.5 g/dL (ref 2.9–4.4)
Albumin/Glob SerPl: 0.9 (ref 0.7–1.7)
Alpha 1: 0.2 g/dL (ref 0.0–0.4)
Alpha2 Glob SerPl Elph-Mcnc: 0.8 g/dL (ref 0.4–1.0)
B-Globulin SerPl Elph-Mcnc: 0.8 g/dL (ref 0.7–1.3)
Gamma Glob SerPl Elph-Mcnc: 2.2 g/dL — ABNORMAL HIGH (ref 0.4–1.8)
Globulin, Total: 4 g/dL — ABNORMAL HIGH (ref 2.2–3.9)
IgA: 74 mg/dL (ref 61–437)
IgG (Immunoglobin G), Serum: 1265 mg/dL (ref 603–1613)
IgM (Immunoglobulin M), Srm: 1683 mg/dL — ABNORMAL HIGH (ref 15–143)
M Protein SerPl Elph-Mcnc: 0.9 g/dL — ABNORMAL HIGH
Total Protein ELP: 7.5 g/dL (ref 6.0–8.5)

## 2020-01-09 ENCOUNTER — Ambulatory Visit
Admission: RE | Admit: 2020-01-09 | Discharge: 2020-01-09 | Disposition: A | Discharge status: Home / Self Care | Payer: Medicare Other | Source: Ambulatory Visit | Attending: Vascular Surgery | Admitting: Vascular Surgery

## 2020-01-09 DIAGNOSIS — I712 Thoracic aortic aneurysm, without rupture, unspecified: Secondary | ICD-10-CM

## 2020-01-09 DIAGNOSIS — I700 Atherosclerosis of aorta: Secondary | ICD-10-CM | POA: Diagnosis not present

## 2020-01-09 DIAGNOSIS — I513 Intracardiac thrombosis, not elsewhere classified: Secondary | ICD-10-CM | POA: Diagnosis not present

## 2020-01-09 DIAGNOSIS — I2510 Atherosclerotic heart disease of native coronary artery without angina pectoris: Secondary | ICD-10-CM | POA: Diagnosis not present

## 2020-01-09 MED ORDER — IOPAMIDOL (ISOVUE-370) INJECTION 76%
75.0000 mL | Freq: Once | INTRAVENOUS | Status: AC | PRN
  Administered 2020-01-09: 75 mL via INTRAVENOUS

## 2020-01-14 ENCOUNTER — Encounter: Payer: Self-pay | Admitting: Vascular Surgery

## 2020-01-14 ENCOUNTER — Other Ambulatory Visit: Payer: Self-pay

## 2020-01-14 ENCOUNTER — Ambulatory Visit: Payer: Medicare Other | Admitting: Vascular Surgery

## 2020-01-14 VITALS — BP 119/61 | HR 48 | Temp 97.7°F | Resp 18 | Ht 67.0 in | Wt 157.5 lb

## 2020-01-14 DIAGNOSIS — I714 Abdominal aortic aneurysm, without rupture, unspecified: Secondary | ICD-10-CM

## 2020-01-14 DIAGNOSIS — I712 Thoracic aortic aneurysm, without rupture, unspecified: Secondary | ICD-10-CM

## 2020-01-14 NOTE — Progress Notes (Signed)
Vascular and Vein Specialist of [place]  Patient name: Charles Terry MRN: [id number] DOB: October 17, 1927 Sex: male  REASON FOR VISIT: Follow-up prior repair of thoracic aneurysm and abdominal aortic aneurysm  HPI: Charles Terry is a 86 y.o. male here today for follow-up.  He is here with his wife.  He is his usual animated self.  He reports no new difficulties.  He has remained quite active and has had no new cardiac difficulty.  He has no symptoms referable to his aneurysm.  He is status post open aneurysm repair of his infrarenal aortic aneurysm and 2006.  He underwent thoracic stent graft repair in June 2020.  He does have a history of infra vena cava filter placement prior to her urologic surgery.  Past Medical History:  Diagnosis Date  . Arthritis   . BPH (benign prostatic hypertrophy) with urinary obstruction 11/12   surg  . Coronary artery disease involving native coronary artery 2005   100% LAD, 90% proximal circumflex followed by 80% OM1, 100% RCA. Right PDA and left DL in a codominant system --> CABG '05. low risk Nuc 5/12  . Dementia ([place])   . Dyslipidemia   . Essential hypertension   . H/O transfusion of packed red blood cells MAY 2012  . History of AAA (abdominal aortic aneurysm) repair 2006   AAA R&G Hemasheild 14 mm x 10 mm (Dr. Donnetta Hutching);; CT scan in 2013 shows 4.8 cm thoracic aortic aneurysm - the followed by Dr. Donnetta Hutching  . History of blood clots    right lower extremity dvt  after inguinal hernia surgery may 2012--doppler follow up report 04/07/11 at [facility] heart and vaxcular shows resolution of the dvt--but pt is to have IVC filter placed 05/05/11--prior to planned prostate surgery scheduled for 05/09/11.  [name] History of DVT of lower extremity    right lower extremity dvt  after inguinal hernia surgery may 2012--doppler follow up report 04/07/11 at [facility] heart and vaxcular shows resolution of the dvt--but pt is to have IVC  filter placed 05/05/11--prior to planned prostate surgery scheduled for 05/09/11.  [name] History of hematuria   . Memory disorder 02/14/2019  . Renal cyst may 2012   bleeding from right renal cyst /acute renal failure secondary to urinary retention from bph --pt states  the cyst is no longer a problem--pt still has indwelling foley catheter--plans  open  suprapubic prostatectomy on 11/19 with dr. Gaynelle Arabian  . S/P CABG x 5 2005    LIMA-LAD, SeqSVG-OM1-OM2, Seq SVG- RPDA-LPL  . S/P insertion of IVC (inferior vena caval) filter 05/05/11   prior to prostate surgery    Family History  Problem Relation Age of Onset  . Aneurysm Mother        Died at age 35  . Aneurysm Father   . Lung cancer Father        Died at age 71  . Dementia Sister   . Prostate cancer Brother   . Heart attack Brother 26  . Dementia Brother   . Alzheimer's disease Brother     SOCIAL HISTORY: Social History   Tobacco Use  . Smoking status: Former Smoker    Packs/day: 1.00    Years: 20.00    Pack years: 20.00    Quit date: 12/17/1965    Years since quitting: 54.1  . Smokeless tobacco: Never Used  Substance Use Topics  . Alcohol use: Yes    Alcohol/week: 2.0 - 3.0 standard drinks    Types: 2 -  3 Standard drinks or equivalent per week    Comment: OCCAS    Allergies  Allergen Reactions  . Food Anaphylaxis    NO KIWI FRUIT   . Aspirin Rash and Other (See Comments)    ANGIOEDEMA    Current Outpatient Medications  Medication Sig Dispense Refill  . Calcium Carbonate (CALTRATE 600 PO) Take 600 mg by mouth 2 (two) times daily.     . Cholecalciferol (D3-1000 PO) Take 1,000 mg by mouth daily.    . clopidogrel (PLAVIX) 75 MG tablet Take 1 tablet (75 mg total) by mouth every other day. 90 tablet 3  . donepezil (ARICEPT) 10 MG tablet Take 1 tablet (10 mg total) by mouth at bedtime. 90 tablet 3  . fish oil-omega-3 fatty acids 1000 MG capsule Take 1 g by mouth daily.    [name] losartan (COZAAR) 100 MG tablet Take 1/2  (one-half) tablet by mouth once daily 45 tablet 2  . metoprolol tartrate (LOPRESSOR) 25 MG tablet Take 12.5 mg by mouth 2 (two) times daily.     . Multiple Vitamins-Minerals (MULTIVITAMINS THER. W/MINERALS) TABS Take 1 tablet by mouth daily.     . simvastatin (ZOCOR) 40 MG tablet Take 20 mg by mouth at bedtime.     . tamsulosin (FLOMAX) 0.4 MG CAPS capsule Take 0.4 mg by mouth daily after supper.      No current facility-administered medications for this visit.    REVIEW OF SYSTEMS:  [X]  denotes positive finding, [ ]  denotes negative finding Cardiac  Comments:  Chest pain or chest pressure:    Shortness of breath upon exertion:    Short of breath when lying flat:    Irregular heart rhythm:        Vascular    Pain in calf, thigh, or hip brought on by ambulation:    Pain in feet at night that wakes you up from your sleep:     Blood clot in your veins:    Leg swelling:           PHYSICAL EXAM: Vitals:   01/14/20 1303  BP: (!) 119/61  Pulse: 48  Resp: 18  Temp: 97.7 F (36.5 C)  TempSrc: Temporal  SpO2: 95%  Weight: 157 lb 8 oz (71.4 kg)  Height: 5\' 7"  (1.702 m)    GENERAL: The patient is a well-nourished male, in no acute distress. The vital signs are documented above. CARDIOVASCULAR: Carotid arteries without bruits bilaterally.  2+ radial 2+ femoral 2+ popliteal pulses. PULMONARY: There is good air exchange  MUSCULOSKELETAL: There are no major deformities or cyanosis. NEUROLOGIC: No focal weakness or paresthesias are detected. SKIN: There are no ulcers or rashes noted. PSYCHIATRIC: The patient has a normal affect.  DATA:  CT of his chest revealed excellent positioning of his descending thoracic stent with no evidence of endoleak.  There is some mural thrombus inside the stent graft.  There is no flow-limiting narrowing.  MEDICAL ISSUES: Stable status post open abdominal aortic aneurysm repair 2016 thoracic stent graft repair in 2020.  He will continue full activities  with no limitation.  We will see him again in 2 years with CT of chest abdomen and pelvis    Rosetta Posner, MD [facility] Tel [phone] Pager [phone]

## 2020-03-25 DIAGNOSIS — Z0120 Encounter for dental examination and cleaning without abnormal findings: Secondary | ICD-10-CM | POA: Diagnosis not present

## 2020-03-27 DIAGNOSIS — Z23 Encounter for immunization: Secondary | ICD-10-CM | POA: Diagnosis not present

## 2020-04-03 ENCOUNTER — Inpatient Hospital Stay: Payer: Medicare Other | Attending: Adult Health

## 2020-04-03 ENCOUNTER — Other Ambulatory Visit: Payer: Self-pay

## 2020-04-03 DIAGNOSIS — D472 Monoclonal gammopathy: Secondary | ICD-10-CM | POA: Insufficient documentation

## 2020-04-03 DIAGNOSIS — R778 Other specified abnormalities of plasma proteins: Secondary | ICD-10-CM

## 2020-04-03 LAB — CBC WITH DIFFERENTIAL (CANCER CENTER ONLY)
Abs Immature Granulocytes: 0.01 10*3/uL (ref 0.00–0.07)
Basophils Absolute: 0.1 10*3/uL (ref 0.0–0.1)
Basophils Relative: 1 %
Eosinophils Absolute: 0.3 10*3/uL (ref 0.0–0.5)
Eosinophils Relative: 4 %
HCT: 41.3 % (ref 39.0–52.0)
Hemoglobin: 13.8 g/dL (ref 13.0–17.0)
Immature Granulocytes: 0 %
Lymphocytes Relative: 38 %
Lymphs Abs: 2.8 10*3/uL (ref 0.7–4.0)
MCH: 31.7 pg (ref 26.0–34.0)
MCHC: 33.4 g/dL (ref 30.0–36.0)
MCV: 94.7 fL (ref 80.0–100.0)
Monocytes Absolute: 0.6 10*3/uL (ref 0.1–1.0)
Monocytes Relative: 8 %
Neutro Abs: 3.7 10*3/uL (ref 1.7–7.7)
Neutrophils Relative %: 49 %
Platelet Count: 152 10*3/uL (ref 150–400)
RBC: 4.36 MIL/uL (ref 4.22–5.81)
RDW: 14.4 % (ref 11.5–15.5)
WBC Count: 7.4 10*3/uL (ref 4.0–10.5)
nRBC: 0 % (ref 0.0–0.2)

## 2020-04-03 LAB — CMP (CANCER CENTER ONLY)
ALT: 15 U/L (ref 0–44)
AST: 19 U/L (ref 15–41)
Albumin: 3.4 g/dL — ABNORMAL LOW (ref 3.5–5.0)
Alkaline Phosphatase: 42 U/L (ref 38–126)
Anion gap: 5 (ref 5–15)
BUN: 20 mg/dL (ref 8–23)
CO2: 29 mmol/L (ref 22–32)
Calcium: 9.8 mg/dL (ref 8.9–10.3)
Chloride: 105 mmol/L (ref 98–111)
Creatinine: 1.09 mg/dL (ref 0.61–1.24)
GFR, Estimated: 60 mL/min — ABNORMAL LOW (ref >60)
Glucose, Bld: 120 mg/dL — ABNORMAL HIGH (ref 70–99)
Potassium: 4.2 mmol/L (ref 3.5–5.1)
Sodium: 139 mmol/L (ref 135–145)
Total Bilirubin: 0.4 mg/dL (ref 0.3–1.2)
Total Protein: 8.3 g/dL — ABNORMAL HIGH (ref 6.5–8.1)

## 2020-04-06 DIAGNOSIS — R351 Nocturia: Secondary | ICD-10-CM | POA: Diagnosis not present

## 2020-04-06 DIAGNOSIS — N401 Enlarged prostate with lower urinary tract symptoms: Secondary | ICD-10-CM | POA: Diagnosis not present

## 2020-04-06 LAB — MULTIPLE MYELOMA PANEL, SERUM
Albumin SerPl Elph-Mcnc: 3.2 g/dL (ref 2.9–4.4)
Albumin/Glob SerPl: 0.8 (ref 0.7–1.7)
Alpha 1: 0.2 g/dL (ref 0.0–0.4)
Alpha2 Glob SerPl Elph-Mcnc: 0.8 g/dL (ref 0.4–1.0)
B-Globulin SerPl Elph-Mcnc: 1.1 g/dL (ref 0.7–1.3)
Gamma Glob SerPl Elph-Mcnc: 2.4 g/dL — ABNORMAL HIGH (ref 0.4–1.8)
Globulin, Total: 4.5 g/dL — ABNORMAL HIGH (ref 2.2–3.9)
IgA: 82 mg/dL (ref 61–437)
IgG (Immunoglobin G), Serum: 1317 mg/dL (ref 603–1613)
IgM (Immunoglobulin M), Srm: 1762 mg/dL — ABNORMAL HIGH (ref 15–143)
M Protein SerPl Elph-Mcnc: 1 g/dL — ABNORMAL HIGH
Total Protein ELP: 7.7 g/dL (ref 6.0–8.5)

## 2020-04-06 LAB — KAPPA/LAMBDA LIGHT CHAINS
Kappa free light chain: 17.9 mg/L (ref 3.3–19.4)
Kappa, lambda light chain ratio: 0.48 (ref 0.26–1.65)
Lambda free light chains: 37 mg/L — ABNORMAL HIGH (ref 5.7–26.3)

## 2020-04-08 ENCOUNTER — Other Ambulatory Visit: Payer: Self-pay | Admitting: Oncology

## 2020-04-08 DIAGNOSIS — D472 Monoclonal gammopathy: Secondary | ICD-10-CM

## 2020-05-05 ENCOUNTER — Ambulatory Visit: Payer: Medicare Other | Admitting: Cardiology

## 2020-06-24 ENCOUNTER — Other Ambulatory Visit: Payer: Self-pay | Admitting: Cardiology

## 2020-06-30 ENCOUNTER — Ambulatory Visit: Payer: Medicare Other | Admitting: Cardiology

## 2020-06-30 DIAGNOSIS — E1121 Type 2 diabetes mellitus with diabetic nephropathy: Secondary | ICD-10-CM | POA: Diagnosis not present

## 2020-06-30 DIAGNOSIS — I10 Essential (primary) hypertension: Secondary | ICD-10-CM | POA: Diagnosis not present

## 2020-06-30 DIAGNOSIS — I2510 Atherosclerotic heart disease of native coronary artery without angina pectoris: Secondary | ICD-10-CM | POA: Diagnosis not present

## 2020-06-30 DIAGNOSIS — E785 Hyperlipidemia, unspecified: Secondary | ICD-10-CM | POA: Diagnosis not present

## 2020-07-03 ENCOUNTER — Inpatient Hospital Stay: Payer: Medicare Other | Attending: Adult Health

## 2020-07-08 DIAGNOSIS — H2513 Age-related nuclear cataract, bilateral: Secondary | ICD-10-CM | POA: Diagnosis not present

## 2020-07-08 DIAGNOSIS — H5703 Miosis: Secondary | ICD-10-CM | POA: Diagnosis not present

## 2020-07-23 ENCOUNTER — Ambulatory Visit (INDEPENDENT_AMBULATORY_CARE_PROVIDER_SITE_OTHER): Payer: Medicare Other | Admitting: Cardiology

## 2020-07-23 ENCOUNTER — Encounter: Payer: Self-pay | Admitting: Cardiology

## 2020-07-23 ENCOUNTER — Other Ambulatory Visit: Payer: Self-pay

## 2020-07-23 VITALS — BP 120/50 | HR 46 | Ht 67.0 in | Wt 160.0 lb

## 2020-07-23 DIAGNOSIS — I10 Essential (primary) hypertension: Secondary | ICD-10-CM

## 2020-07-23 DIAGNOSIS — I712 Thoracic aortic aneurysm, without rupture, unspecified: Secondary | ICD-10-CM

## 2020-07-23 DIAGNOSIS — Z951 Presence of aortocoronary bypass graft: Secondary | ICD-10-CM

## 2020-07-23 DIAGNOSIS — I2510 Atherosclerotic heart disease of native coronary artery without angina pectoris: Secondary | ICD-10-CM | POA: Diagnosis not present

## 2020-07-23 DIAGNOSIS — E785 Hyperlipidemia, unspecified: Secondary | ICD-10-CM

## 2020-07-23 DIAGNOSIS — Z9889 Other specified postprocedural states: Secondary | ICD-10-CM

## 2020-07-23 DIAGNOSIS — I82519 Chronic embolism and thrombosis of unspecified femoral vein: Secondary | ICD-10-CM

## 2020-07-23 NOTE — Patient Instructions (Addendum)
Medication Instructions:    METOPROLOL  TAKE 1/2 TABLET  ( 12.5 MG)  ONCE A DAY FOR ONE WEEK THEN STOP ALL TOGETHER.   CONTINUE ALL OTHER MEDICATIONS   *If you need a refill on your cardiac medications before your next appointment, please call your pharmacy*   Lab Work: NOT NEEDED   Testing/Procedures: NOT NEEDED  Follow-Up: At [facility], you and your health needs are our priority.  As part of our continuing mission to provide you with exceptional heart care, we have created designated Provider Care Teams.  These Care Teams include your primary Cardiologist (physician) and Advanced Practice Providers (APPs -  Physician Assistants and Nurse Practitioners) who all work together to provide you with the care you need, when you need it.  We recommend signing up for the patient portal called "MyChart".  Sign up information is provided on this After Visit Summary.  MyChart is used to connect with patients for Virtual Visits (Telemedicine).  Patients are able to view lab/test results, encounter notes, upcoming appointments, etc.  Non-urgent messages can be sent to your provider as well.   To learn more about what you can do with MyChart, go to [email or web address].    Your next appointment:   12 month(s)  The format for your next appointment:   In Person  Provider:   Glenetta Hew, MD

## 2020-07-23 NOTE — Progress Notes (Signed)
Primary Care Provider: Deland Pretty, MD Cardiologist: Charles Hew, MD Electrophysiologist: None  Clinic Note: Chief Complaint  Patient presents with  . Follow-up    Follow-up 6 months  . Coronary Artery Disease    No angina   ===================================  ASSESSMENT/PLAN   Problem List Items Addressed This Visit    Atherosclerosis of native coronary artery without angina pectoris - Primary (Chronic)    He has not had any anginal symptoms in quite a long time. Per his request, we are not doing any further testing. Last stress test was in 2012, and last echo was in 2017.  Unless he has recurrent symptoms, we will test further.  Plan: Recommend that he continue to stay active.  Maintenance dose clopidogrel for CAD aortic disease.  Continue low-dose ARB, but with significant bradycardia, we will wean off of Toprol altogether. He is only taking 12.5mg  twice daily => wean off beta-blocker the next 2 weeks.      Thoracic aortic aneurysm without rupture (HCC) (Chronic)    This should be being followed up by vascular surgery. No issues but I can tell. With aspirin allergy, maintained on maintenance dose Plavix. Continue ARB and statin, but are weaning off beta-blocker because bradycardia.      Dyslipidemia, goal LDL below 70 (Chronic)    His last lipids in July showed well-controlled LDL on current dose of the simvastatin. He is tolerating it well without any myalgias. He is only taking 20 mg/half dose.  Well-controlled. No change.      History of AAA (abdominal aortic aneurysm) repair-'06 (Chronic)    Also followed by vascular surgery.      S/P CABG x 5 (Chronic)    Distant history of CABG. Last Myoview was in 2012. Nonischemic.  Currently no symptoms. Per his request, we are not doing further ischemic evaluation.      Essential hypertension (Chronic)    Blood pressure looks great. With bradycardia we are weaning off Lopressor/metoprolol. Continue with current  dose of 50 mg losartan.  No further positional dizziness. Again hydration. I think if we stopped beta-blocker, we can avoid dizziness      DVT (deep venous thrombosis) RLE after hernia repair 5/12 (Chronic)    IVC filter placed after hernia repair back in 2016. No longer on DOAC. Is on maintenance dose Plavix. No sign of recurrent venous thrombosis.        ===================================  HPI:    Charles Terry is a 87 y.o. male with a PMH below who presents today for in person evaluation-48-month follow-up.  Cardiovascular History  CAD h/o CABG x5 in 2005 (LIMA-LAD, sequential SVG-OM1-OM 2, rectal SVG-RPDA-LPL) in response to a catheterization showing 100% LAD occlusion, 90% proximal circumflex - 80-90% OM1 as well as totally occluded RCA.   Stress Test May 2012: Normal.  ECHO June 2017.-EF 60 to 65% with noR WMA. GR 1 DD. Mild AI, AS.Mildly increasedPA pressure restfully 37 mmHg.  Aortic Disease:  S/p AAA repair with a Hemashield graft14 mm x 10 mmin 2006 by Charles Terry.  S/p TEVAR - (Charles Terry) June 2020  S/p IVC filter placement for recurrent DVT.  Charles Terry was last seen on Nov 14, 2019 via telemedicine. I last saw him in person in July 2019.  Recent Hospitalizations: None  Reviewed  CV studies:    The following studies were reviewed today: (if available, images/films reviewed: From Epic Chart or Care Everywhere) . None:  Interval History:   Charles Terry returns  for his first in person visit and about 2+ years. He is doing quite well Dr. Quay Terry standpoint. He is Terry active, but not "winning and elanzepine records ". He is a little bit fatigued, but denies any resting exertional chest pain or dyspnea. No dizzy or woozy/lightheaded symptoms. No syncope or near syncope.  CV Review of Symptoms (Summary: no chest pain or dyspnea on exertion positive for - Maybe little bit exercise intolerance, but not as active simply because of  deconditioning and age. negative for - edema, irregular heartbeat, orthopnea, palpitations, paroxysmal nocturnal dyspnea, rapid heart rate, shortness of breath or Lightheadedness or dizziness, syncope/near syncope or TIA/amaurosis fugax, claudication  The patient does not have symptoms concerning for COVID-19 infection (fever, chills, cough, or new shortness of breath).   REVIEWED OF SYSTEMS   Review of Systems  HENT: Positive for hearing loss (Very hard of hearing). Negative for nosebleeds and tinnitus.   Respiratory: Negative for cough and shortness of breath.   Cardiovascular: Negative for leg swelling.  Gastrointestinal: Negative for blood in stool.  Genitourinary: Negative for hematuria.  Musculoskeletal: Positive for joint pain (Stable mild arthritis pains.). Negative for falls and myalgias.  Neurological: Negative for dizziness (Only if he tries to do something too fast.) and focal weakness.  Endo/Heme/Allergies:       History of MGUS with mild anemia.  Psychiatric/Behavioral: Positive for memory loss (Minor). Negative for depression. The patient is not nervous/anxious and does not have insomnia.    I have reviewed and (if needed) personally updated the patient's problem list, medications, allergies, past medical and surgical history, social and family history.   PAST MEDICAL HISTORY   Past Medical History:  Diagnosis Date  . Arthritis   . BPH (benign prostatic hypertrophy) with urinary obstruction 11/12   surg  . Coronary artery disease involving native coronary artery 2005   100% LAD, 90% proximal circumflex followed by 80% OM1, 100% RCA. Right PDA and left DL in a codominant system --> CABG '05. low risk Nuc 5/12  . Dementia ([place])   . Dyslipidemia   . Essential hypertension   . H/O transfusion of packed red blood cells MAY 2012  . History of AAA (abdominal aortic aneurysm) repair 2006   AAA R&G Hemasheild 14 mm x 10 mm (Charles Terry);; CT scan in 2013 shows 4.8 cm thoracic  aortic aneurysm - the followed by Charles Terry  . History of blood clots    right lower extremity dvt  after inguinal hernia surgery may 2012--doppler follow up report 04/07/11 at [facility] heart and vaxcular shows resolution of the dvt--but pt is to have IVC filter placed 05/05/11--prior to planned prostate surgery scheduled for 05/09/11.  [name] History of DVT of lower extremity    right lower extremity dvt  after inguinal hernia surgery may 2012--doppler follow up report 04/07/11 at [facility] heart and vaxcular shows resolution of the dvt--but pt is to have IVC filter placed 05/05/11--prior to planned prostate surgery scheduled for 05/09/11.  [name] History of hematuria   . Memory disorder 02/14/2019  . Renal cyst may 2012   bleeding from right renal cyst /acute renal failure secondary to urinary retention from bph --pt states  the cyst is no longer a problem--pt still has indwelling foley catheter--plans  open  suprapubic prostatectomy on 11/19 with dr. Gaynelle Arabian  . S/P CABG x 5 2005    LIMA-LAD, SeqSVG-OM1-OM2, Seq SVG- RPDA-LPL  . S/P insertion of IVC (inferior vena caval) filter 05/05/11   prior to  prostate surgery    PAST SURGICAL HISTORY   Past Surgical History:  Procedure Laterality Date  . 2D ECHOCARDIOGRAM  11/02/2010; June 2017   a. EF 45-50%, w/ anterior hypokinesis, otherwise normal; mildly sclerotic aortic valve;; b.EF 60-65%. No regional wall motion abnormality. Grade 1 diastolic dysfunction. Mildly reduced RV function. Mild elevation of pulmonary pressures   . AAA RPAIR  03/29/2005   14x56mm Hemashield graft  . CARDIAC CATHETERIZATION  12/23/2003   Recommended CABG; 100% LAD, 90% proximal circumflex followed by 80% OM1, 100% RCA. Right PDA and left DL in a codominant system.  . CORONARY ARTERY BYPASS GRAFT  12/25/2003   x5, LIMA-LAD, SeqSVG-OM1-OM2, Seq SVG- RPDA-LPL  . CYSTOSCOPY N/A 11/30/2018   Procedure: Cystoscopy Flexible;  Surgeon: Festus Aloe, MD;  Location: [facility];   Service: Urology;  Laterality: N/A;  . HERNIA REPAIR Left    MAY 2012  . IVC  11/12   placed prior to prostate surg  . PERSANTINE MYOVIEW  10/22/2010   Normal perfusion  . PROSTATECTOMY  05/09/2011   Procedure: PROSTATECTOMY SUPRAPUBIC;  Surgeon: Ailene Rud, MD;  Location: [facility];  Service: Urology;  Laterality: N/A;  Open  . THORACIC AORTIC ENDOVASCULAR STENT GRAFT Right 11/30/2018   Procedure: THORACIC AORTIC ENDOVASCULAR STENT GRAFT;  Surgeon: Rosetta Posner, MD;  Location: [facility];  Service: Vascular;  Laterality: Right;  . TRANSURETHRAL RESECTION OF PROSTATE N/A 12/24/2015   Procedure: TRANSURETHRAL RESECTION OF THE PROSTATE (TURP);  Surgeon: Carolan Clines, MD;  Location: [facility];  Service: Urology;  Laterality: N/A;  . VENOUS DOPPLER  04/04/2011   No evidence of thrombus or thrombophlebitis    Immunization History  Administered Date(s) Administered  . PFIZER(Purple Top)SARS-COV-2 Vaccination 07/15/2019, 08/05/2019    MEDICATIONS/ALLERGIES   Current Meds  Medication Sig  . Calcium Carbonate (CALTRATE 600 PO) Take 600 mg by mouth 2 (two) times daily.   . Cholecalciferol (D3-1000 PO) Take 1,000 mg by mouth daily.  . clopidogrel (PLAVIX) 75 MG tablet Take 1 tablet (75 mg total) by mouth every other day.  . donepezil (ARICEPT) 10 MG tablet Take 1 tablet (10 mg total) by mouth at bedtime.  . fish oil-omega-3 fatty acids 1000 MG capsule Take 1 g by mouth daily.  [name] losartan (COZAAR) 100 MG tablet TAKE 1/2 TABLET BY MOUTH ONCE DAILY  . Multiple Vitamins-Minerals (MULTIVITAMINS THER. W/MINERALS) TABS Take 1 tablet by mouth daily.  . simvastatin (ZOCOR) 40 MG tablet Take 20 mg by mouth at bedtime.  . tamsulosin (FLOMAX) 0.4 MG CAPS capsule Take 0.4 mg by mouth daily after supper.   . [DISCONTINUED] metoprolol tartrate (LOPRESSOR) 25 MG tablet Take 12.5 mg by mouth 2 (two) times daily.    Allergies  Allergen Reactions  . Food Anaphylaxis    NO KIWI FRUIT   . Aspirin Rash  and Other (See Comments)    ANGIOEDEMA    SOCIAL HISTORY/FAMILY HISTORY   Reviewed in Epic:  Pertinent findings:  Social History   Tobacco Use  . Smoking status: Former Smoker    Packs/day: 1.00    Years: 20.00    Pack years: 20.00    Quit date: 12/17/1965    Years since quitting: 54.6  . Smokeless tobacco: Never Used  Vaping Use  . Vaping Use: Never used  Substance Use Topics  . Alcohol use: Yes    Alcohol/week: 2.0 - 3.0 standard drinks    Types: 2 - 3 Standard drinks or equivalent per week  Comment: OCCAS  . Drug use: No   Social History   Social History Narrative   Pleasant, married father of 2 (one daughter and one son), grandfather 92.    Exercises on a daily basis walking at least 1 mile. .   Does not smoke - quit over 20 years ago. Drinks social alcoholic beverage.   Caffeine use: coffee every morning (1 cup)   Right handed     OBJCTIVE -PE, EKG, labs   Wt Readings from Last 3 Encounters:  07/23/20 160 lb (72.6 kg)  01/14/20 157 lb 8 oz (71.4 kg)  11/14/19 158 lb (71.7 kg)    Physical Exam: BP (!) 120/50 (BP Location: Left Arm, Patient Position: Sitting)   Pulse (!) 46   Ht 5\' 7"  (1.702 m)   Wt 160 lb (72.6 kg)   SpO2 95%   BMI 25.06 kg/m  Physical Exam Vitals reviewed.  Constitutional:      General: He is not in acute distress.    Appearance: Normal appearance. He is normal weight. He is not ill-appearing or toxic-appearing.  HENT:     Head: Normocephalic and atraumatic.  Neck:     Vascular: No carotid bruit, hepatojugular reflux or JVD.  Cardiovascular:     Rate and Rhythm: Normal rate and regular rhythm.  No extrasystoles are present.    Chest Wall: PMI is not displaced.     Pulses: Normal pulses.     Heart sounds: Heart sounds are distant (Normal S1 with split S2). Murmur (2/6C-D SEM at RUSB.--Neck.) heard.  No friction rub. No gallop.   Pulmonary:     Effort: Pulmonary effort is normal. No respiratory distress.     Breath sounds:  Normal breath sounds.  Chest:     Chest wall: No tenderness.  Musculoskeletal:        General: No swelling. Normal range of motion.     Cervical back: Normal range of motion and neck supple.  Neurological:     General: No focal deficit present.     Mental Status: He is alert and oriented to person, place, and time.     Motor: No weakness.     Gait: Gait abnormal (Just slow).  Psychiatric:        Mood and Affect: Mood normal.        Behavior: Behavior normal.        Thought Content: Thought content normal.        Judgment: Judgment normal.     Adult ECG Report  Rate: 46;  Rhythm: sinus bradycardia and Otherwise normal axis, normal durations.;   Narrative Interpretation: Stable-significant bradycardia.  Recent Labs:    December 24, 2019: TC 135, TG 141, HDL 43, LDL 67.   June 20, 2020: A1c 5.9.  Lab Results  Component Value Date   CREATININE 1.09 04/03/2020   BUN 20 04/03/2020   NA 139 04/03/2020   K 4.2 04/03/2020   CL 105 04/03/2020   CO2 29 04/03/2020   CBC Latest Ref Rng & Units 04/03/2020 01/02/2020 10/02/2019  WBC 4.0 - 10.5 K/uL 7.4 7.1 6.3  Hemoglobin 13.0 - 17.0 g/dL 13.8 13.8 13.6  Hematocrit 39.0 - 52.0 % 41.3 41.6 41.9  Platelets 150 - 400 K/uL 152 151 151    Lab Results  Component Value Date   TSH 2.087 11/18/2010    ==================================================  COVID-19 Education: The signs and symptoms of COVID-19 were discussed with the patient and how to seek care for testing (follow up  with PCP or arrange E-visit).   The importance of social distancing and COVID-19 vaccination was discussed today. The patient is practicing social distancing & Masking.   I spent a total of 3minutes with the patient spent in direct patient consultation.  Additional time spent with chart review  / charting (studies, outside notes, etc): 8 min Total Time: 37 min   Current medicines are reviewed at length with the patient today.  (+/- concerns) N/A  This  visit occurred during the SARS-CoV-2 public health emergency.  Safety protocols were in place, including screening questions prior to the visit, additional usage of staff PPE, and extensive cleaning of exam room while observing appropriate contact time as indicated for disinfecting solutions.  Notice: This dictation was prepared with Dragon dictation along with smaller phrase technology. Any transcriptional errors that result from this process are unintentional and may not be corrected upon review.  Patient Instructions / Medication Changes & Studies & Tests Ordered   Patient Instructions  Medication Instructions:    METOPROLOL  TAKE 1/2 TABLET  ( 12.5 MG)  ONCE A DAY FOR ONE WEEK THEN STOP ALL TOGETHER.   CONTINUE ALL OTHER MEDICATIONS   *If you need a refill on your cardiac medications before your next appointment, please call your pharmacy*   Lab Work: NOT NEEDED   Testing/Procedures: NOT NEEDED  Follow-Up: At [facility], you and your health needs are our priority.  As part of our continuing mission to provide you with exceptional heart care, we have created designated Provider Care Teams.  These Care Teams include your primary Cardiologist (physician) and Advanced Practice Providers (APPs -  Physician Assistants and Nurse Practitioners) who all work together to provide you with the care you need, when you need it.  We recommend signing up for the patient portal called "MyChart".  Sign up information is provided on this After Visit Summary.  MyChart is used to connect with patients for Virtual Visits (Telemedicine).  Patients are able to view lab/test results, encounter notes, upcoming appointments, etc.  Non-urgent messages can be sent to your provider as well.   To learn more about what you can do with MyChart, go to [email or web address].    Your next appointment:   12 month(s)  The format for your next appointment:   In Person  Provider:   Glenetta Hew,  MD    Studies Ordered:   No orders of the defined types were placed in this encounter.    Charles Terry, M.D., M.S. Interventional Cardiologist   Pager # [phone] Phone # [phone] [address] [place], [place] [address]   Thank you for choosing Heart[facility] at [facility]!!

## 2020-08-01 ENCOUNTER — Encounter: Payer: Self-pay | Admitting: Cardiology

## 2020-08-01 NOTE — Assessment & Plan Note (Signed)
IVC filter placed after hernia repair back in 2016. No longer on DOAC. Is on maintenance dose Plavix. No sign of recurrent venous thrombosis.

## 2020-08-01 NOTE — Assessment & Plan Note (Signed)
This should be being followed up by vascular surgery. No issues but I can tell. With aspirin allergy, maintained on maintenance dose Plavix. Continue ARB and statin, but are weaning off beta-blocker because bradycardia.

## 2020-08-01 NOTE — Assessment & Plan Note (Addendum)
Blood pressure looks great. With bradycardia we are weaning off Lopressor/metoprolol. Continue with current dose of 50 mg losartan.  No further positional dizziness. Again hydration. I think if we stopped beta-blocker, we can avoid dizziness

## 2020-08-01 NOTE — Assessment & Plan Note (Signed)
His last lipids in July showed well-controlled LDL on current dose of the simvastatin. He is tolerating it well without any myalgias. He is only taking 20 mg/half dose.  Well-controlled. No change.

## 2020-08-01 NOTE — Assessment & Plan Note (Signed)
He has not had any anginal symptoms in quite a long time. Per his request, we are not doing any further testing. Last stress test was in 2012, and last echo was in 2017.  Unless he has recurrent symptoms, we will test further.  Plan: Recommend that he continue to stay active.  Maintenance dose clopidogrel for CAD aortic disease.  Continue low-dose ARB, but with significant bradycardia, we will wean off of Toprol altogether. He is only taking 12.5mg  twice daily => wean off beta-blocker the next 2 weeks.

## 2020-08-01 NOTE — Assessment & Plan Note (Signed)
Distant history of CABG. Last Myoview was in 2012. Nonischemic.  Currently no symptoms. Per his request, we are not doing further ischemic evaluation.

## 2020-08-01 NOTE — Assessment & Plan Note (Signed)
Also followed by vascular surgery.

## 2020-08-25 ENCOUNTER — Ambulatory Visit: Payer: Medicare Other | Admitting: Neurology

## 2020-09-07 ENCOUNTER — Other Ambulatory Visit: Payer: Self-pay | Admitting: Cardiology

## 2020-09-30 NOTE — Progress Notes (Signed)
[facility]  [phone] Fax:([phone]) [phone]     ID: Charles Terry DOB: 10/17/1927  MR#: [id number]  FMB#:[id number]  Patient Care Team: Charles Pretty, MD as PCP - General (Internal Medicine) Charles Man, MD as PCP - Cardiology (Cardiology) Charles Clines, MD (Inactive) as Consulting Physician (Urology) Charles Rocker, MD as Consulting Physician (Rheumatology) Charles Ducking, MD as Consulting Physician (Neurology) Charles Cruel, MD OTHER MD:  CHIEF COMPLAINT: abnormal SPEP  CURRENT TREATMENT: observation   INTERVAL HISTORY: Charles Terry returns today for follow up of his abnormal SPEP. He is accompanied by his wife Charles Terry and Charles Terry are very concerned because they really did not understand exactly why he is here why we need to see him and so on so we discussed that at length today so that they feel more reassured.  REVIEW OF SYSTEMS: Charles Terry tells me that nobody believes he is 41.  He has certainly plenty of energy for someone his age and walks a mile together with [place] most days.  He denies any pain fever rash unexplained weight loss fatigue or adenopathy.  He does not experience drenching sweats.  A detailed review of systems was otherwise stable.   COVID 19 VACCINATION STATUS: Status post Pfizer x2   HISTORY OF CURRENT ILLNESS: From the original intake note:  Charles Terry was sent to see rheumatologist, Charles Terry due to an elevated sedimentation rate, for a full work up.  He has had no pain, aches, or any other condition that he is aware of.  Dr. Kathlene Terry subsequently drew a serum protein electrophoreses which showed an M spike of 1.1 with two peaks in the beta gamma region that represents a monoclonal protein.    Charles Terry does not have anemia, hypercalcemia, increased creatinine, or bony aches/pains.    The patient's subsequent history is as detailed below.   PAST MEDICAL HISTORY: Past Medical History:  Diagnosis Date  .  Arthritis   . BPH (benign prostatic hypertrophy) with urinary obstruction 11/12   surg  . Coronary artery disease involving native coronary artery 2005   100% LAD, 90% proximal circumflex followed by 80% OM1, 100% RCA. Right PDA and left DL in a codominant system --> CABG '05. low risk Nuc 5/12  . Dementia (Charles Terry)   . Dyslipidemia   . Essential hypertension   . H/O transfusion of packed red blood cells MAY 2012  . History of AAA (abdominal aortic aneurysm) repair 2006   AAA R&G Hemasheild 14 mm x 10 mm (Dr. Donnetta Terry);; CT scan in 2013 shows 4.8 cm thoracic aortic aneurysm - the followed by Dr. Donnetta Terry  . History of blood clots    right lower extremity dvt  after inguinal hernia surgery may 2012--doppler follow up report 04/07/11 at [facility] heart and vaxcular shows resolution of the dvt--but pt is to have IVC filter placed 05/05/11--prior to planned prostate surgery scheduled for 05/09/11.  Charles Terry Kitchen History of DVT of lower extremity    right lower extremity dvt  after inguinal hernia surgery may 2012--doppler follow up report 04/07/11 at [facility] heart and vaxcular shows resolution of the dvt--but pt is to have IVC filter placed 05/05/11--prior to planned prostate surgery scheduled for 05/09/11.  Charles Terry Kitchen History of hematuria   . Memory disorder 02/14/2019  . Renal cyst may 2012   bleeding from right renal cyst /acute renal failure secondary to urinary retention from bph --pt states  the cyst is no longer a problem--pt still has indwelling foley catheter--plans  open  suprapubic prostatectomy on 11/19 with Charles Terry  . S/P CABG x 5 2005    LIMA-LAD, SeqSVG-OM1-OM2, Seq SVG- RPDA-LPL  . S/P insertion of IVC (inferior vena caval) filter 05/05/11   prior to prostate surgery    PAST SURGICAL HISTORY: Past Surgical History:  Procedure Laterality Date  . 2D ECHOCARDIOGRAM  11/02/2010; June 2017   a. EF 45-50%, w/ anterior hypokinesis, otherwise normal; mildly sclerotic aortic valve;; b.EF 60-65%. No  regional wall motion abnormality. Grade 1 diastolic dysfunction. Mildly reduced RV function. Mild elevation of pulmonary pressures   . AAA RPAIR  03/29/2005   14x91mm Hemashield graft  . CARDIAC CATHETERIZATION  12/23/2003   Recommended CABG; 100% LAD, 90% proximal circumflex followed by 80% OM1, 100% RCA. Right PDA and left DL in a codominant system.  . CORONARY ARTERY BYPASS GRAFT  12/25/2003   x5, LIMA-LAD, SeqSVG-OM1-OM2, Seq SVG- RPDA-LPL  . CYSTOSCOPY N/A 11/30/2018   Procedure: Cystoscopy Flexible;  Surgeon: Charles Aloe, MD;  Location: [facility];  Service: Urology;  Laterality: N/A;  . HERNIA REPAIR Left    MAY 2012  . IVC  11/12   placed prior to prostate surg  . PERSANTINE MYOVIEW  10/22/2010   Normal perfusion  . PROSTATECTOMY  05/09/2011   Procedure: PROSTATECTOMY SUPRAPUBIC;  Surgeon: Charles Rud, MD;  Location: [facility];  Service: Urology;  Laterality: N/A;  Open  . THORACIC AORTIC ENDOVASCULAR STENT GRAFT Right 11/30/2018   Procedure: THORACIC AORTIC ENDOVASCULAR STENT GRAFT;  Surgeon: Charles Posner, MD;  Location: [facility];  Service: Vascular;  Laterality: Right;  . TRANSURETHRAL RESECTION OF PROSTATE N/A 12/24/2015   Procedure: TRANSURETHRAL RESECTION OF THE PROSTATE (TURP);  Surgeon: Charles Clines, MD;  Location: [facility];  Service: Urology;  Laterality: N/A;  . VENOUS DOPPLER  04/04/2011   No evidence of thrombus or thrombophlebitis    FAMILY HISTORY Family History  Problem Relation Age of Onset  . Aneurysm Mother        Died at age 48  . Aneurysm Father   . Lung cancer Father        Died at age 58  . Dementia Sister   . Prostate cancer Brother   . Heart attack Brother 55  . Dementia Brother   . Alzheimer's disease Brother      SOCIAL HISTORY:  Married and lives with his wife in [place], [place].  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  His son is Charles Terry and lives in [place], [place] and  his daughter is Charles Terry and she lives in [place].      ADVANCED DIRECTIVES: His wife is his HCPOA.  He has a living will.   HEALTH MAINTENANCE: Social History   Tobacco Use  . Smoking status: Former Smoker    Packs/day: 1.00    Years: 20.00    Pack years: 20.00    Quit date: 12/17/1965    Years since quitting: 54.8  . Smokeless tobacco: Never Used  Vaping Use  . Vaping Use: Never used  Substance Use Topics  . Alcohol use: Yes    Alcohol/week: 2.0 - 3.0 standard drinks    Types: 2 - 3 Standard drinks or equivalent per week    Comment: OCCAS  . Drug use: No     Colonoscopy:  PSA:  Bone density:   Allergies  Allergen Reactions  . Food Anaphylaxis    NO KIWI FRUIT   . Aspirin  Rash and Other (See Comments)    ANGIOEDEMA    Current Outpatient Medications  Medication Sig Dispense Refill  . Calcium Carbonate (CALTRATE 600 PO) Take 600 mg by mouth 2 (two) times daily.     . Cholecalciferol (D3-1000 PO) Take 1,000 mg by mouth daily.    . clopidogrel (PLAVIX) 75 MG tablet Take 1 tablet (75 mg total) by mouth every other day. 90 tablet 3  . donepezil (ARICEPT) 10 MG tablet Take 1 tablet (10 mg total) by mouth at bedtime. 90 tablet 3  . fish oil-omega-3 fatty acids 1000 MG capsule Take 1 g by mouth daily.    Charles Terry Kitchen losartan (COZAAR) 100 MG tablet Take 1/2 (one-half) tablet by mouth once daily 45 tablet 3  . Multiple Vitamins-Minerals (MULTIVITAMINS THER. W/MINERALS) TABS Take 1 tablet by mouth daily.    . simvastatin (ZOCOR) 40 MG tablet Take 20 mg by mouth at bedtime.    . tamsulosin (FLOMAX) 0.4 MG CAPS capsule Take 0.4 mg by mouth daily after supper.      No current facility-administered medications for this visit.    OBJECTIVE: White Terry who appears younger than stated age  55:   10/01/20 1421  BP: (!) 110/49  Pulse: 65  Resp: 16  Temp: 97.7 F (36.5 C)  SpO2: 98%     Body mass index is 24.46 kg/m.   Wt Readings from Last 3 Encounters:  10/01/20 156  lb 3.2 oz (70.9 kg)  07/23/20 160 lb (72.6 kg)  01/14/20 157 lb 8 oz (71.4 kg)      ECOG FS:1 - Symptomatic but completely ambulatory  Sclerae unicteric, EOMs intact Wearing a mask No cervical or supraclavicular adenopathy Lungs no rales or rhonchi Heart regular rate and rhythm Abd soft, nontender, positive bowel sounds MSK no focal spinal tenderness, no upper extremity lymphedema Neuro: nonfocal, well oriented, appropriate affect   LAB RESULTS:  CMP     Component Value Date/Time   NA 139 10/01/2020 1340   K 4.0 10/01/2020 1340   CL 104 10/01/2020 1340   CO2 24 10/01/2020 1340   GLUCOSE 157 (H) 10/01/2020 1340   BUN 17 10/01/2020 1340   CREATININE 1.09 10/01/2020 1340   CALCIUM 9.5 10/01/2020 1340   PROT 8.2 (H) 10/01/2020 1340   ALBUMIN 3.6 10/01/2020 1340   AST 22 10/01/2020 1340   ALT 18 10/01/2020 1340   ALKPHOS 49 10/01/2020 1340   BILITOT 0.5 10/01/2020 1340   GFRNONAA >60 10/01/2020 1340   GFRAA >60 01/02/2020 1329    Lab Results  Component Value Date   TOTALPROTELP 7.7 04/03/2020     Lab Results  Component Value Date   KPAFRELGTCHN 17.9 04/03/2020   LAMBDASER 37.0 (H) 04/03/2020   KAPLAMBRATIO 0.48 04/03/2020    Lab Results  Component Value Date   WBC 6.5 10/01/2020   NEUTROABS 3.8 10/01/2020   HGB 14.3 10/01/2020   HCT 42.4 10/01/2020   MCV 93.2 10/01/2020   PLT 144 (L) 10/01/2020      Chemistry      Component Value Date/Time   NA 139 10/01/2020 1340   K 4.0 10/01/2020 1340   CL 104 10/01/2020 1340   CO2 24 10/01/2020 1340   BUN 17 10/01/2020 1340   CREATININE 1.09 10/01/2020 1340      Component Value Date/Time   CALCIUM 9.5 10/01/2020 1340   ALKPHOS 49 10/01/2020 1340   AST 22 10/01/2020 1340   ALT 18 10/01/2020 1340   BILITOT 0.5 10/01/2020  1340       No results found for: LABCA2  No components found for: KPVVZS827  No results for input(s): INR in the last 168 hours.  No results found for: LABCA2  No results found  for: MBE675  No results found for: QGB201  No results found for: EOF121  No results found for: CA2729  No components found for: HGQUANT  No results found for: CEA1 / No results found for: CEA1   No results found for: AFPTUMOR  No results found for: CHROMOGRNA  No results found for: PSA1  Appointment on 10/01/2020  Component Date Value Ref Range Status  . Sodium 10/01/2020 139  135 - 145 mmol/L Final  . Potassium 10/01/2020 4.0  3.5 - 5.1 mmol/L Final  . Chloride 10/01/2020 104  98 - 111 mmol/L Final  . CO2 10/01/2020 24  22 - 32 mmol/L Final  . Glucose, Bld 10/01/2020 157* 70 - 99 mg/dL Final   Glucose reference range applies only to samples taken after fasting for at least 8 hours.  . BUN 10/01/2020 17  8 - 23 mg/dL Final  . Creatinine 10/01/2020 1.09  0.61 - 1.24 mg/dL Final  . Calcium 10/01/2020 9.5  8.9 - 10.3 mg/dL Final  . Total Protein 10/01/2020 8.2* 6.5 - 8.1 g/dL Final  . Albumin 10/01/2020 3.6  3.5 - 5.0 g/dL Final  . AST 10/01/2020 22  15 - 41 U/L Final  . ALT 10/01/2020 18  0 - 44 U/L Final  . Alkaline Phosphatase 10/01/2020 49  38 - 126 U/L Final  . Total Bilirubin 10/01/2020 0.5  0.3 - 1.2 mg/dL Final  . GFR, Estimated 10/01/2020 >60  >60 mL/min Final   Comment: (NOTE) Calculated using the CKD-EPI Creatinine Equation (2021)   . Anion gap 10/01/2020 11  5 - 15 Final   Performed at [facility], [place] [address] [place], [place] [address]  . WBC Count 10/01/2020 6.5  4.0 - 10.5 K/uL Final  . RBC 10/01/2020 4.55  4.22 - 5.81 MIL/uL Final  . Hemoglobin 10/01/2020 14.3  13.0 - 17.0 g/dL Final  . HCT 10/01/2020 42.4  39.0 - 52.0 % Final  . MCV 10/01/2020 93.2  80.0 - 100.0 fL Final  . MCH 10/01/2020 31.4  26.0 - 34.0 pg Final  . MCHC 10/01/2020 33.7  30.0 - 36.0 g/dL Final  . RDW 10/01/2020 13.5  11.5 - 15.5 % Final  . Platelet Count 10/01/2020 144* 150 - 400 K/uL Final  . nRBC 10/01/2020 0.0  0.0 - 0.2 % Final  . Neutrophils  Relative % 10/01/2020 58  % Final  . Neutro Abs 10/01/2020 3.8  1.7 - 7.7 K/uL Final  . Lymphocytes Relative 10/01/2020 33  % Final  . Lymphs Abs 10/01/2020 2.1  0.7 - 4.0 K/uL Final  . Monocytes Relative 10/01/2020 6  % Final  . Monocytes Absolute 10/01/2020 0.4  0.1 - 1.0 K/uL Final  . Eosinophils Relative 10/01/2020 2  % Final  . Eosinophils Absolute 10/01/2020 0.1  0.0 - 0.5 K/uL Final  . Basophils Relative 10/01/2020 1  % Final  . Basophils Absolute 10/01/2020 0.1  0.0 - 0.1 K/uL Final  . Immature Granulocytes 10/01/2020 0  % Final  . Abs Immature Granulocytes 10/01/2020 0.02  0.00 - 0.07 K/uL Final   Performed at [facility], [place] [address], [place], [place] [address]    (this displays the last labs from the last 3 days)  Lab Results  Component Value Date   TOTALPROTELP 7.7 04/03/2020   (this displays SPEP labs)  Lab Results  Component Value Date   KPAFRELGTCHN 17.9 04/03/2020   LAMBDASER 37.0 (H) 04/03/2020   KAPLAMBRATIO 0.48 04/03/2020   (kappa/lambda light chains)  No results found for: HGBA, HGBA2QUANT, HGBFQUANT, HGBSQUAN (Hemoglobinopathy evaluation)   No results found for: LDH  No results found for: IRON, TIBC, IRONPCTSAT (Iron and TIBC)  No results found for: FERRITIN  Urinalysis    Component Value Date/Time   COLORURINE YELLOW 11/27/2018 0838   APPEARANCEUR CLEAR 11/27/2018 0838   LABSPEC 1.019 11/27/2018 0838   PHURINE 5.0 11/27/2018 0838   GLUCOSEU NEGATIVE 11/27/2018 0838   HGBUR NEGATIVE 11/27/2018 0838   BILIRUBINUR NEGATIVE 11/27/2018 [address] 11/27/2018 0838   PROTEINUR NEGATIVE 11/27/2018 0838   UROBILINOGEN 0.2 05/10/2011 0725   NITRITE NEGATIVE 11/27/2018 0838   LEUKOCYTESUR NEGATIVE 11/27/2018 0838    STUDIES: No results found.   ASSESSMENT: 87 y.o. Terry with M spike noted on 09/12/2019 and referred to hematology for evaluation  1. MGUS  (a) M spike noted on 09/12/2019 in beta-gamma  region  (b) additional work-up shows a monoclonal IgM spike  (c) no hypercalcemia, increased creatinine, anemia, or bone issues noted   PLAN: I explained to [place] that he has an abnormal protein in his blood.  The protein itself is of little consequence.  It is made by cells: Plasma cells in his bone marrow.  If there were a lot of plasma cells in his marrow that might interfere with marrow function but right now he has for example normal hemoglobin and normal white cells and almost completely normal platelet count.  Accordingly his marrow function is still normal.  Second problem that can be caused by this abnormal protein is problems with the kidneys and problems with calcium.  He does not have those issues.  In short he has a condition which is currently benign but which does need to be observed.  It could potentially overtime turn into a real problem which might need treatment.  Right now no treatment is needed only observation.  He is going to return in 6 months and at that point we will again review his lab work.  I am adding a 24-hour urine to his work-up this time and I will send him a letter with all those results once they are in  They were reassured by this discussion and I think they have a better understanding of the overall situation  Total encounter time 25 minutes.Sarajane Jews C. Kadedra Vanaken, MD 10/01/2020 3:07 PM Medical Oncology and Hematology [facility] [place], [place] [address] Tel. [phone]    Fax. [phone]   I, Wilburn Mylar, am acting as scribe for Dr. Virgie Dad. Naava Janeway.  I, Lurline Del MD, have reviewed the above documentation for accuracy and completeness, and I agree with the above.    *Total Encounter Time as defined by the Centers for Medicare and Medicaid Services includes, in addition to the face-to-face time of a patient visit (documented in the note above) non-face-to-face time: obtaining and  reviewing outside history, ordering and reviewing medications, tests or procedures, care coordination (communications with other health care professionals or caregivers) and documentation in the medical record.

## 2020-10-01 ENCOUNTER — Inpatient Hospital Stay: Payer: Medicare Other

## 2020-10-01 ENCOUNTER — Inpatient Hospital Stay: Payer: Medicare Other | Attending: Adult Health | Admitting: Oncology

## 2020-10-01 ENCOUNTER — Other Ambulatory Visit: Payer: Self-pay

## 2020-10-01 VITALS — BP 110/49 | HR 65 | Temp 97.7°F | Resp 16 | Ht 67.0 in | Wt 156.2 lb

## 2020-10-01 DIAGNOSIS — R778 Other specified abnormalities of plasma proteins: Secondary | ICD-10-CM

## 2020-10-01 DIAGNOSIS — D472 Monoclonal gammopathy: Secondary | ICD-10-CM | POA: Insufficient documentation

## 2020-10-01 LAB — CBC WITH DIFFERENTIAL (CANCER CENTER ONLY)
Abs Immature Granulocytes: 0.02 10*3/uL (ref 0.00–0.07)
Basophils Absolute: 0.1 10*3/uL (ref 0.0–0.1)
Basophils Relative: 1 %
Eosinophils Absolute: 0.1 10*3/uL (ref 0.0–0.5)
Eosinophils Relative: 2 %
HCT: 42.4 % (ref 39.0–52.0)
Hemoglobin: 14.3 g/dL (ref 13.0–17.0)
Immature Granulocytes: 0 %
Lymphocytes Relative: 33 %
Lymphs Abs: 2.1 10*3/uL (ref 0.7–4.0)
MCH: 31.4 pg (ref 26.0–34.0)
MCHC: 33.7 g/dL (ref 30.0–36.0)
MCV: 93.2 fL (ref 80.0–100.0)
Monocytes Absolute: 0.4 10*3/uL (ref 0.1–1.0)
Monocytes Relative: 6 %
Neutro Abs: 3.8 10*3/uL (ref 1.7–7.7)
Neutrophils Relative %: 58 %
Platelet Count: 144 10*3/uL — ABNORMAL LOW (ref 150–400)
RBC: 4.55 MIL/uL (ref 4.22–5.81)
RDW: 13.5 % (ref 11.5–15.5)
WBC Count: 6.5 10*3/uL (ref 4.0–10.5)
nRBC: 0 % (ref 0.0–0.2)

## 2020-10-01 LAB — CMP (CANCER CENTER ONLY)
ALT: 18 U/L (ref 0–44)
AST: 22 U/L (ref 15–41)
Albumin: 3.6 g/dL (ref 3.5–5.0)
Alkaline Phosphatase: 49 U/L (ref 38–126)
Anion gap: 11 (ref 5–15)
BUN: 17 mg/dL (ref 8–23)
CO2: 24 mmol/L (ref 22–32)
Calcium: 9.5 mg/dL (ref 8.9–10.3)
Chloride: 104 mmol/L (ref 98–111)
Creatinine: 1.09 mg/dL (ref 0.61–1.24)
GFR, Estimated: >60 mL/min (ref >60)
Glucose, Bld: 157 mg/dL — ABNORMAL HIGH (ref 70–99)
Potassium: 4 mmol/L (ref 3.5–5.1)
Sodium: 139 mmol/L (ref 135–145)
Total Bilirubin: 0.5 mg/dL (ref 0.3–1.2)
Total Protein: 8.2 g/dL — ABNORMAL HIGH (ref 6.5–8.1)

## 2020-10-02 LAB — KAPPA/LAMBDA LIGHT CHAINS
Kappa free light chain: 14.8 mg/L (ref 3.3–19.4)
Kappa, lambda light chain ratio: 0.39 (ref 0.26–1.65)
Lambda free light chains: 38.2 mg/L — ABNORMAL HIGH (ref 5.7–26.3)

## 2020-10-05 LAB — MULTIPLE MYELOMA PANEL, SERUM
Albumin SerPl Elph-Mcnc: 3.8 g/dL (ref 2.9–4.4)
Albumin/Glob SerPl: 0.9 (ref 0.7–1.7)
Alpha 1: 0.2 g/dL (ref 0.0–0.4)
Alpha2 Glob SerPl Elph-Mcnc: 0.8 g/dL (ref 0.4–1.0)
B-Globulin SerPl Elph-Mcnc: 0.9 g/dL (ref 0.7–1.3)
Gamma Glob SerPl Elph-Mcnc: 2.4 g/dL — ABNORMAL HIGH (ref 0.4–1.8)
Globulin, Total: 4.3 g/dL — ABNORMAL HIGH (ref 2.2–3.9)
IgA: 76 mg/dL (ref 61–437)
IgG (Immunoglobin G), Serum: 1271 mg/dL (ref 603–1613)
IgM (Immunoglobulin M), Srm: 1772 mg/dL — ABNORMAL HIGH (ref 15–143)
M Protein SerPl Elph-Mcnc: 1.2 g/dL — ABNORMAL HIGH
Total Protein ELP: 8.1 g/dL (ref 6.0–8.5)

## 2020-10-06 ENCOUNTER — Encounter: Payer: Self-pay | Admitting: Oncology

## 2020-10-06 ENCOUNTER — Other Ambulatory Visit: Payer: Self-pay

## 2020-10-06 DIAGNOSIS — D472 Monoclonal gammopathy: Secondary | ICD-10-CM

## 2020-10-08 LAB — UPEP/UIFE/LIGHT CHAINS/TP, 24-HR UR
% BETA, Urine: 35.2 %
ALPHA 1 URINE: 7.7 %
Albumin, U: 31.5 %
Alpha 2, Urine: 13.6 %
Free Kappa Lt Chains,Ur: 11.12 mg/L (ref 1.17–86.46)
Free Kappa/Lambda Ratio: 3.76 (ref 1.83–14.26)
Free Lambda Lt Chains,Ur: 2.96 mg/L (ref 0.27–15.21)
GAMMA GLOBULIN URINE: 12.1 %
Total Protein, Urine-Ur/day: 60 mg/24 hr (ref 30–150)
Total Protein, Urine: 7.5 mg/dL
Total Volume: 800

## 2020-12-28 DIAGNOSIS — E785 Hyperlipidemia, unspecified: Secondary | ICD-10-CM | POA: Diagnosis not present

## 2020-12-28 DIAGNOSIS — E119 Type 2 diabetes mellitus without complications: Secondary | ICD-10-CM | POA: Diagnosis not present

## 2020-12-28 DIAGNOSIS — I10 Essential (primary) hypertension: Secondary | ICD-10-CM | POA: Diagnosis not present

## 2020-12-31 DIAGNOSIS — I70209 Unspecified atherosclerosis of native arteries of extremities, unspecified extremity: Secondary | ICD-10-CM | POA: Diagnosis not present

## 2020-12-31 DIAGNOSIS — Z0000 Encounter for general adult medical examination without abnormal findings: Secondary | ICD-10-CM | POA: Diagnosis not present

## 2020-12-31 DIAGNOSIS — E1121 Type 2 diabetes mellitus with diabetic nephropathy: Secondary | ICD-10-CM | POA: Diagnosis not present

## 2020-12-31 DIAGNOSIS — I2510 Atherosclerotic heart disease of native coronary artery without angina pectoris: Secondary | ICD-10-CM | POA: Diagnosis not present

## 2021-03-24 ENCOUNTER — Other Ambulatory Visit: Payer: Self-pay

## 2021-03-24 ENCOUNTER — Inpatient Hospital Stay: Payer: Medicare Other | Attending: Oncology

## 2021-03-24 DIAGNOSIS — Z886 Allergy status to analgesic agent status: Secondary | ICD-10-CM | POA: Insufficient documentation

## 2021-03-24 DIAGNOSIS — Z87891 Personal history of nicotine dependence: Secondary | ICD-10-CM | POA: Insufficient documentation

## 2021-03-24 DIAGNOSIS — Z9079 Acquired absence of other genital organ(s): Secondary | ICD-10-CM | POA: Diagnosis not present

## 2021-03-24 DIAGNOSIS — Z818 Family history of other mental and behavioral disorders: Secondary | ICD-10-CM | POA: Insufficient documentation

## 2021-03-24 DIAGNOSIS — E785 Hyperlipidemia, unspecified: Secondary | ICD-10-CM | POA: Insufficient documentation

## 2021-03-24 DIAGNOSIS — I10 Essential (primary) hypertension: Secondary | ICD-10-CM | POA: Diagnosis not present

## 2021-03-24 DIAGNOSIS — Z8249 Family history of ischemic heart disease and other diseases of the circulatory system: Secondary | ICD-10-CM | POA: Insufficient documentation

## 2021-03-24 DIAGNOSIS — Z86718 Personal history of other venous thrombosis and embolism: Secondary | ICD-10-CM | POA: Diagnosis not present

## 2021-03-24 DIAGNOSIS — Z8042 Family history of malignant neoplasm of prostate: Secondary | ICD-10-CM | POA: Insufficient documentation

## 2021-03-24 DIAGNOSIS — D472 Monoclonal gammopathy: Secondary | ICD-10-CM | POA: Insufficient documentation

## 2021-03-24 DIAGNOSIS — I2510 Atherosclerotic heart disease of native coronary artery without angina pectoris: Secondary | ICD-10-CM | POA: Insufficient documentation

## 2021-03-24 DIAGNOSIS — Z79899 Other long term (current) drug therapy: Secondary | ICD-10-CM | POA: Insufficient documentation

## 2021-03-24 DIAGNOSIS — R778 Other specified abnormalities of plasma proteins: Secondary | ICD-10-CM

## 2021-03-24 DIAGNOSIS — Z801 Family history of malignant neoplasm of trachea, bronchus and lung: Secondary | ICD-10-CM | POA: Insufficient documentation

## 2021-03-24 LAB — CMP (CANCER CENTER ONLY)
ALT: 17 U/L (ref 0–44)
AST: 21 U/L (ref 15–41)
Albumin: 3.5 g/dL (ref 3.5–5.0)
Alkaline Phosphatase: 46 U/L (ref 38–126)
Anion gap: 10 (ref 5–15)
BUN: 20 mg/dL (ref 8–23)
CO2: 26 mmol/L (ref 22–32)
Calcium: 9.7 mg/dL (ref 8.9–10.3)
Chloride: 102 mmol/L (ref 98–111)
Creatinine: 1.21 mg/dL (ref 0.61–1.24)
GFR, Estimated: 57 mL/min — ABNORMAL LOW (ref >60)
Glucose, Bld: 111 mg/dL — ABNORMAL HIGH (ref 70–99)
Potassium: 4 mmol/L (ref 3.5–5.1)
Sodium: 138 mmol/L (ref 135–145)
Total Bilirubin: 0.5 mg/dL (ref 0.3–1.2)
Total Protein: 8 g/dL (ref 6.5–8.1)

## 2021-03-24 LAB — CBC WITH DIFFERENTIAL (CANCER CENTER ONLY)
Abs Immature Granulocytes: 0.01 10*3/uL (ref 0.00–0.07)
Basophils Absolute: 0.1 10*3/uL (ref 0.0–0.1)
Basophils Relative: 1 %
Eosinophils Absolute: 0.2 10*3/uL (ref 0.0–0.5)
Eosinophils Relative: 3 %
HCT: 41.5 % (ref 39.0–52.0)
Hemoglobin: 14 g/dL (ref 13.0–17.0)
Immature Granulocytes: 0 %
Lymphocytes Relative: 35 %
Lymphs Abs: 2.5 10*3/uL (ref 0.7–4.0)
MCH: 31.6 pg (ref 26.0–34.0)
MCHC: 33.7 g/dL (ref 30.0–36.0)
MCV: 93.7 fL (ref 80.0–100.0)
Monocytes Absolute: 0.5 10*3/uL (ref 0.1–1.0)
Monocytes Relative: 7 %
Neutro Abs: 3.8 10*3/uL (ref 1.7–7.7)
Neutrophils Relative %: 54 %
Platelet Count: 159 10*3/uL (ref 150–400)
RBC: 4.43 MIL/uL (ref 4.22–5.81)
RDW: 14.1 % (ref 11.5–15.5)
WBC Count: 7.1 10*3/uL (ref 4.0–10.5)
nRBC: 0 % (ref 0.0–0.2)

## 2021-03-25 LAB — KAPPA/LAMBDA LIGHT CHAINS
Kappa free light chain: 18.8 mg/L (ref 3.3–19.4)
Kappa, lambda light chain ratio: 0.52 (ref 0.26–1.65)
Lambda free light chains: 36.5 mg/L — ABNORMAL HIGH (ref 5.7–26.3)

## 2021-03-26 DIAGNOSIS — Z23 Encounter for immunization: Secondary | ICD-10-CM | POA: Diagnosis not present

## 2021-03-26 DIAGNOSIS — N400 Enlarged prostate without lower urinary tract symptoms: Secondary | ICD-10-CM | POA: Diagnosis not present

## 2021-03-26 DIAGNOSIS — R972 Elevated prostate specific antigen [PSA]: Secondary | ICD-10-CM | POA: Diagnosis not present

## 2021-03-26 LAB — MULTIPLE MYELOMA PANEL, SERUM
Albumin SerPl Elph-Mcnc: 3.4 g/dL (ref 2.9–4.4)
Albumin/Glob SerPl: 0.9 (ref 0.7–1.7)
Alpha 1: 0.2 g/dL (ref 0.0–0.4)
Alpha2 Glob SerPl Elph-Mcnc: 0.7 g/dL (ref 0.4–1.0)
B-Globulin SerPl Elph-Mcnc: 0.8 g/dL (ref 0.7–1.3)
Gamma Glob SerPl Elph-Mcnc: 2.3 g/dL — ABNORMAL HIGH (ref 0.4–1.8)
Globulin, Total: 4 g/dL — ABNORMAL HIGH (ref 2.2–3.9)
IgA: 59 mg/dL — ABNORMAL LOW (ref 61–437)
IgG (Immunoglobin G), Serum: 1240 mg/dL (ref 603–1613)
IgM (Immunoglobulin M), Srm: 1758 mg/dL — ABNORMAL HIGH (ref 15–143)
M Protein SerPl Elph-Mcnc: 1 g/dL — ABNORMAL HIGH
Total Protein ELP: 7.4 g/dL (ref 6.0–8.5)

## 2021-03-31 ENCOUNTER — Inpatient Hospital Stay: Payer: Medicare Other | Admitting: Oncology

## 2021-03-31 ENCOUNTER — Other Ambulatory Visit: Payer: Self-pay

## 2021-03-31 VITALS — BP 132/58 | HR 73 | Temp 97.5°F | Resp 18 | Ht 67.0 in | Wt 155.7 lb

## 2021-03-31 DIAGNOSIS — D472 Monoclonal gammopathy: Secondary | ICD-10-CM | POA: Diagnosis not present

## 2021-03-31 DIAGNOSIS — Z9079 Acquired absence of other genital organ(s): Secondary | ICD-10-CM | POA: Diagnosis not present

## 2021-03-31 DIAGNOSIS — I2510 Atherosclerotic heart disease of native coronary artery without angina pectoris: Secondary | ICD-10-CM | POA: Diagnosis not present

## 2021-03-31 DIAGNOSIS — Z86718 Personal history of other venous thrombosis and embolism: Secondary | ICD-10-CM | POA: Diagnosis not present

## 2021-03-31 DIAGNOSIS — E785 Hyperlipidemia, unspecified: Secondary | ICD-10-CM | POA: Diagnosis not present

## 2021-03-31 DIAGNOSIS — Z818 Family history of other mental and behavioral disorders: Secondary | ICD-10-CM | POA: Diagnosis not present

## 2021-03-31 DIAGNOSIS — Z801 Family history of malignant neoplasm of trachea, bronchus and lung: Secondary | ICD-10-CM | POA: Diagnosis not present

## 2021-03-31 DIAGNOSIS — Z886 Allergy status to analgesic agent status: Secondary | ICD-10-CM | POA: Diagnosis not present

## 2021-03-31 DIAGNOSIS — Z87891 Personal history of nicotine dependence: Secondary | ICD-10-CM | POA: Diagnosis not present

## 2021-03-31 DIAGNOSIS — Z8042 Family history of malignant neoplasm of prostate: Secondary | ICD-10-CM | POA: Diagnosis not present

## 2021-03-31 DIAGNOSIS — Z8249 Family history of ischemic heart disease and other diseases of the circulatory system: Secondary | ICD-10-CM | POA: Diagnosis not present

## 2021-03-31 DIAGNOSIS — Z79899 Other long term (current) drug therapy: Secondary | ICD-10-CM | POA: Diagnosis not present

## 2021-03-31 DIAGNOSIS — I10 Essential (primary) hypertension: Secondary | ICD-10-CM | POA: Diagnosis not present

## 2021-03-31 NOTE — Progress Notes (Signed)
[place]  [phone] [phone]     ID: Charles Terry DOB: [birth year]  MR#: [id number]  WUX#:[id number]  Patient Care Team: Deland Pretty, MD as PCP - General (Internal Medicine) Leonie Man, MD as PCP - Cardiology (Cardiology) Carolan Clines, MD (Inactive) as Consulting Physician (Urology) Lahoma Rocker, MD as Consulting Physician (Rheumatology) Kathrynn Ducking, MD as Consulting Physician (Neurology) Chauncey Cruel, MD OTHER MD:  CHIEF COMPLAINT: abnormal SPEP (M-GUS)  CURRENT TREATMENT: observation   INTERVAL HISTORY: Charles Terry returns today for follow up of his M-GUS. He is accompanied by his wife Charles Terry  We are following his protein studies which have been very stable.  His M protein in particular shows no trend: Results for DAREON, NUNZIATO (MRN [id number]) as of 03/31/2021 14:14  Ref. Range 10/02/2019 13:20 01/02/2020 13:30 04/03/2020 13:58 10/01/2020 13:51 03/24/2021 12:21  M Protein SerPl Elph-Mcnc Latest Ref Range: Not Observed g/dL 1.1 (H) 0.9 (H) 1.0 (H) 1.2 (H) 1.0 (H)   His kappa/lambda ratio and total IgG are normal.  While he has not had a formal bone survey, he undergoes CT angio studies on a yearly basis to follow his aneurysm and there have been no lytic lesions noted.  REVIEW OF SYSTEMS: Ricci no longer plays golf and walks about a mile a day for exercise.  Overall he feels well.  He has had no unusual weight loss, fatigue, rash, pruritus, drenching sweats, or other systemic symptoms that he is aware of.  It detailed review of systems is otherwise stable   COVID 19 VACCINATION STATUS: Status post Pfizer x4 as of October 2022   HISTORY OF CURRENT ILLNESS: From the original intake note:  Charles Terry was sent to see rheumatologist, Dr. Lahoma Rocker due to an elevated sedimentation rate, for a full work up.  He has had no pain, aches, or any other condition that he is aware of.  Dr. Kathlene November subsequently drew a serum  protein electrophoreses which showed an M spike of 1.1 with two peaks in the beta gamma region that represents a monoclonal protein.    Colby does not have anemia, hypercalcemia, increased creatinine, or bony aches/pains.    The patient's subsequent history is as detailed below.   PAST MEDICAL HISTORY: Past Medical History:  Diagnosis Date   Arthritis    BPH (benign prostatic hypertrophy) with urinary obstruction 11/12   surg   Coronary artery disease involving native coronary artery 2005   100% LAD, 90% proximal circumflex followed by 80% OM1, 100% RCA. Right PDA and left DL in a codominant system --> CABG '05. low risk Nuc 5/12   Dementia ([place])    Dyslipidemia    Essential hypertension    H/O transfusion of packed red blood cells MAY 2012   History of AAA (abdominal aortic aneurysm) repair 2006   AAA R&G Hemasheild 14 mm x 10 mm (Dr. Donnetta Hutching);; CT scan in 2013 shows 4.8 cm thoracic aortic aneurysm - the followed by Dr. Donnetta Hutching   History of blood clots    right lower extremity dvt  after inguinal hernia surgery may 2012--doppler follow up report 04/07/11 at [facility] heart and vaxcular shows resolution of the dvt--but pt is to have IVC filter placed 05/05/11--prior to planned prostate surgery scheduled for 05/09/11.   History of DVT of lower extremity    right lower extremity dvt  after inguinal hernia surgery may 2012--doppler follow up report 04/07/11 at [facility] heart and vaxcular shows resolution of the dvt--but pt is  to have IVC filter placed 05/05/11--prior to planned prostate surgery scheduled for 05/09/11.   History of hematuria    Memory disorder 02/14/2019   Renal cyst may 2012   bleeding from right renal cyst /acute renal failure secondary to urinary retention from bph --pt states  the cyst is no longer a problem--pt still has indwelling foley catheter--plans  open  suprapubic prostatectomy on 11/19 with dr. Gaynelle Arabian   S/P CABG x 5 2005    LIMA-LAD, SeqSVG-OM1-OM2, Seq  SVG- RPDA-LPL   S/P insertion of IVC (inferior vena caval) filter 05/05/11   prior to prostate surgery    PAST SURGICAL HISTORY: Past Surgical History:  Procedure Laterality Date   2D ECHOCARDIOGRAM  11/02/2010; June 2017   a. EF 45-50%, w/ anterior hypokinesis, otherwise normal; mildly sclerotic aortic valve;; b.EF 60-65%. No regional wall motion abnormality. Grade 1 diastolic dysfunction. Mildly reduced RV function. Mild elevation of pulmonary pressures    AAA RPAIR  03/29/2005   14x97m Hemashield graft   CARDIAC CATHETERIZATION  12/23/2003   Recommended CABG; 100% LAD, 90% proximal circumflex followed by 80% OM1, 100% RCA. Right PDA and left DL in a codominant system.   CORONARY ARTERY BYPASS GRAFT  12/25/2003   x5, LIMA-LAD, SeqSVG-OM1-OM2, Seq SVG- RPDA-LPL   CYSTOSCOPY N/A 11/30/2018   Procedure: Cystoscopy Flexible;  Surgeon: EFestus Aloe MD;  [facility] [facility]  Service: Urology;  Laterality: N/A;   HERNIA REPAIR Left    MAY 2012   IVC  11/12   placed prior to prostate surg   PERSANTINE MYOVIEW  10/22/2010   Normal perfusion   PROSTATECTOMY  05/09/2011   Procedure: PROSTATECTOMY SUPRAPUBIC;  Surgeon: SAilene Rud MD;  Location: [facility];  Service: Urology;  Laterality: N/A;  Open   THORACIC AORTIC ENDOVASCULAR STENT GRAFT Right 11/30/2018   Procedure: THORACIC AORTIC ENDOVASCULAR STENT GRAFT;  Surgeon: ERosetta Posner MD;  [facility]  Service: Vascular;  Laterality: Right;   TRANSURETHRAL RESECTION OF PROSTATE N/A 12/24/2015   Procedure: TRANSURETHRAL RESECTION OF THE PROSTATE (TURP);  Surgeon: SCarolan Clines MD;  Location: [facility];  Service: Urology;  Laterality: N/A;   VENOUS DOPPLER  04/04/2011   No evidence of thrombus or thrombophlebitis    FAMILY HISTORY Family History  Problem Relation Age of Onset   Aneurysm Mother        Died at age 85  Aneurysm Father    Lung cancer Father        Died at age 85  Dementia Sister    Prostate cancer Brother     Heart attack Brother 516  Dementia Brother    Alzheimer's disease Brother      SOCIAL HISTORY:  Married and [place]  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 MIda Roguefather.  His son is GAnoop [place]and [place] his daughter is BForensic scientistand she lives in [place]      ADVANCED DIRECTIVES: His wife is his HCPOA.  He has a living will in place   HEALTH MAINTENANCE: Social History   Tobacco Use   Smoking status: Former    Packs/day: 1.00    Years: 20.00    Pack years: 20.00    Types: Cigarettes    Quit date: 12/17/1965    Years since quitting: 55.3   Smokeless tobacco: Never  Vaping Use   Vaping Use: Never used  Substance Use Topics  Alcohol use: Yes    Alcohol/week: 2.0 - 3.0 standard drinks    Types: 2 - 3 Standard drinks or equivalent per week    Comment: OCCAS   Drug use: No     Colonoscopy:  PSA:  Bone density:   Allergies  Allergen Reactions   Food Anaphylaxis    NO KIWI FRUIT    Aspirin Rash and Other (See Comments)    ANGIOEDEMA    Current Outpatient Medications  Medication Sig Dispense Refill   Calcium Carbonate (CALTRATE 600 PO) Take 600 mg by mouth 2 (two) times daily.      Cholecalciferol (D3-1000 PO) Take 1,000 mg by mouth daily.     clopidogrel (PLAVIX) 75 MG tablet Take 1 tablet (75 mg total) by mouth every other day. 90 tablet 3   donepezil (ARICEPT) 10 MG tablet Take 1 tablet (10 mg total) by mouth at bedtime. 90 tablet 3   fish oil-omega-3 fatty acids 1000 MG capsule Take 1 g by mouth daily.     losartan (COZAAR) 100 MG tablet Take 1/2 (one-half) tablet by mouth once daily 45 tablet 3   Multiple Vitamins-Minerals (MULTIVITAMINS THER. W/MINERALS) TABS Take 1 tablet by mouth daily.     simvastatin (ZOCOR) 40 MG tablet Take 20 mg by mouth at bedtime.     tamsulosin (FLOMAX) 0.4 MG CAPS capsule Take 0.4 mg by mouth daily after supper.      No  current facility-administered medications for this visit.    OBJECTIVE: White man   Vitals:   03/31/21 1401  BP: (!) 132/58  Pulse: 73  Resp: 18  Temp: (!) 97.5 F (36.4 C)  SpO2: 100%      Body mass index is 24.39 kg/m.   Wt Readings from Last 3 Encounters:  03/31/21 155 lb 11.2 oz (70.6 kg)  10/01/20 156 lb 3.2 oz (70.9 kg)  07/23/20 160 lb (72.6 kg)      ECOG FS:1 - Symptomatic but completely ambulatory  Sclerae unicteric, EOMs intact Wearing a mask No cervical or supraclavicular adenopathy Lungs no rales or rhonchi Heart regular rate and rhythm Abd soft, nontender, positive bowel sounds MSK no focal spinal tenderness, no upper extremity lymphedema Neuro: nonfocal, well oriented, pleasant affect   LAB RESULTS:  CMP     Component Value Date/Time   NA 138 03/24/2021 1221   K 4.0 03/24/2021 1221   CL 102 03/24/2021 1221   CO2 26 03/24/2021 1221   GLUCOSE 111 (H) 03/24/2021 1221   BUN 20 03/24/2021 1221   CREATININE 1.21 03/24/2021 1221   CALCIUM 9.7 03/24/2021 1221   PROT 8.0 03/24/2021 1221   ALBUMIN 3.5 03/24/2021 1221   AST 21 03/24/2021 1221   ALT 17 03/24/2021 1221   ALKPHOS 46 03/24/2021 1221   BILITOT 0.5 03/24/2021 1221   GFRNONAA 57 (L) 03/24/2021 1221   GFRAA >60 01/02/2020 1329    Lab Results  Component Value Date   TOTALPROTELP 7.4 03/24/2021     Lab Results  Component Value Date   KPAFRELGTCHN 18.8 03/24/2021   LAMBDASER 36.5 (H) 03/24/2021   KAPLAMBRATIO 0.52 03/24/2021    Lab Results  Component Value Date   WBC 7.1 03/24/2021   NEUTROABS 3.8 03/24/2021   HGB 14.0 03/24/2021   HCT 41.5 03/24/2021   MCV 93.7 03/24/2021   PLT 159 03/24/2021      Chemistry      Component Value Date/Time   NA 138 03/24/2021 1221   K 4.0 03/24/2021 1221  CL 102 03/24/2021 1221   CO2 26 03/24/2021 1221   BUN 20 03/24/2021 1221   CREATININE 1.21 03/24/2021 1221      Component Value Date/Time   CALCIUM 9.7 03/24/2021 1221   ALKPHOS 46  03/24/2021 1221   AST 21 03/24/2021 1221   ALT 17 03/24/2021 1221   BILITOT 0.5 03/24/2021 1221       No results found for: LABCA2  No components found for: IZTIWP809  No results for input(s): INR in the last 168 hours.  No results found for: LABCA2  No results found for: XIP382  No results found for: NKN397  No results found for: QBH419  No results found for: CA2729  No components found for: HGQUANT  No results found for: CEA1 / No results found for: CEA1   No results found for: AFPTUMOR  No results found for: CHROMOGRNA  No results found for: PSA1  No visits with results within 3 Day(s) from this visit.  Latest known visit with results is:  Appointment on 03/24/2021  Component Date Value Ref Range Status   Kappa free light chain 03/24/2021 18.8  3.3 - 19.4 mg/L Final   Lambda free light chains 03/24/2021 36.5 (A) 5.7 - 26.3 mg/L Final   Kappa, lambda light chain ratio 03/24/2021 0.52  0.26 - 1.65 Final   Comment: (NOTE) Performed At: [facility] [place], [place] [id number] Rush Farmer MD DZ:[id number]    IgG (Immunoglobin G), Serum 03/24/2021 1,240  603 - 1,613 mg/dL Final   IgA 03/24/2021 59 (A) 61 - 437 mg/dL Final   IgM (Immunoglobulin M), Srm 03/24/2021 1,758 (A) 15 - 143 mg/dL Final   Comment: (NOTE) Results confirmed on dilution.    Total Protein ELP 03/24/2021 7.4  6.0 - 8.5 g/dL Corrected   Albumin SerPl Elph-Mcnc 03/24/2021 3.4  2.9 - 4.4 g/dL Corrected   Alpha 1 03/24/2021 0.2  0.0 - 0.4 g/dL Corrected   Alpha2 Glob SerPl Elph-Mcnc 03/24/2021 0.7  0.4 - 1.0 g/dL Corrected   B-Globulin SerPl Elph-Mcnc 03/24/2021 0.8  0.7 - 1.3 g/dL Corrected   Gamma Glob SerPl Elph-Mcnc 03/24/2021 2.3 (A) 0.4 - 1.8 g/dL Corrected   M Protein SerPl Elph-Mcnc 03/24/2021 1.0 (A) Not Observed g/dL Corrected   Comment: An additional m spike was observed at a concentration of 0.7 g/dl    Globulin, Total 03/24/2021 4.0 (A) 2.2 - 3.9 g/dL  Corrected   Albumin/Glob SerPl 03/24/2021 0.9  0.7 - 1.7 Corrected   IFE 1 03/24/2021 Comment (A)  Corrected   Comment: (NOTE) Immunofixation shows IgM monoclonal protein with lambda light chain specificity. Immunofixation shows IgG monoclonal protein with lambda light chain specificity.    Please Note 03/24/2021 Comment   Corrected   Comment: (NOTE) Protein electrophoresis scan will follow via computer, mail, or courier delivery. Performed At: [facility] [place], [place] [id number] Rush Farmer MD [id number]    Sodium 03/24/2021 138  135 - 145 mmol/L Final   Potassium 03/24/2021 4.0  3.5 - 5.1 mmol/L Final   Chloride 03/24/2021 102  98 - 111 mmol/L Final   CO2 03/24/2021 26  22 - 32 mmol/L Final   Glucose, Bld 03/24/2021 111 (A) 70 - 99 mg/dL Final   Glucose reference range applies only to samples taken after fasting for at least 8 hours.   BUN 03/24/2021 20  8 - 23 mg/dL Final   Creatinine 03/24/2021 1.21  0.61 - 1.24 mg/dL Final   Calcium 03/24/2021  9.7  8.9 - 10.3 mg/dL Final   Total Protein 03/24/2021 8.0  6.5 - 8.1 g/dL Final   Albumin 03/24/2021 3.5  3.5 - 5.0 g/dL Final   AST 03/24/2021 21  15 - 41 U/L Final   ALT 03/24/2021 17  0 - 44 U/L Final   Alkaline Phosphatase 03/24/2021 46  38 - 126 U/L Final   Total Bilirubin 03/24/2021 0.5  0.3 - 1.2 mg/dL Final   GFR, Estimated 03/24/2021 57 (A) >60 mL/min Final   Comment: (NOTE) Calculated using the CKD-EPI Creatinine Equation (2021)    Anion gap 03/24/2021 10  5 - 15 Final   Performed at [facility], [place] [address], [place], [place] [address]   WBC Count 03/24/2021 7.1  4.0 - 10.5 K/uL Final   RBC 03/24/2021 4.43  4.22 - 5.81 MIL/uL Final   Hemoglobin 03/24/2021 14.0  13.0 - 17.0 g/dL Final   HCT 03/24/2021 41.5  39.0 - 52.0 % Final   MCV 03/24/2021 93.7  80.0 - 100.0 fL Final   MCH 03/24/2021 31.6  26.0 - 34.0 pg Final   MCHC 03/24/2021 33.7  30.0 - 36.0 g/dL  Final   RDW 03/24/2021 14.1  11.5 - 15.5 % Final   Platelet Count 03/24/2021 159  150 - 400 K/uL Final   nRBC 03/24/2021 0.0  0.0 - 0.2 % Final   Neutrophils Relative % 03/24/2021 54  % Final   Neutro Abs 03/24/2021 3.8  1.7 - 7.7 K/uL Final   Lymphocytes Relative 03/24/2021 35  % Final   Lymphs Abs 03/24/2021 2.5  0.7 - 4.0 K/uL Final   Monocytes Relative 03/24/2021 7  % Final   Monocytes Absolute 03/24/2021 0.5  0.1 - 1.0 K/uL Final   Eosinophils Relative 03/24/2021 3  % Final   Eosinophils Absolute 03/24/2021 0.2  0.0 - 0.5 K/uL Final   Basophils Relative 03/24/2021 1  % Final   Basophils Absolute 03/24/2021 0.1  0.0 - 0.1 K/uL Final   Immature Granulocytes 03/24/2021 0  % Final   Abs Immature Granulocytes 03/24/2021 0.01  0.00 - 0.07 K/uL Final   Performed at [facility], [place] [address] [place], [place] [address]    (this displays the last labs from the last 3 days)  Lab Results  Component Value Date   TOTALPROTELP 7.4 03/24/2021   (this displays SPEP labs)  Lab Results  Component Value Date   KPAFRELGTCHN 18.8 03/24/2021   LAMBDASER 36.5 (H) 03/24/2021   KAPLAMBRATIO 0.52 03/24/2021   (kappa/lambda light chains)  No results found for: HGBA, HGBA2QUANT, HGBFQUANT, HGBSQUAN (Hemoglobinopathy evaluation)   No results found for: LDH  No results found for: IRON, TIBC, IRONPCTSAT (Iron and TIBC)  No results found for: FERRITIN  Urinalysis    Component Value Date/Time   COLORURINE YELLOW 11/27/2018 0838   APPEARANCEUR CLEAR 11/27/2018 0838   LABSPEC 1.019 11/27/2018 0838   PHURINE 5.0 11/27/2018 0838   GLUCOSEU NEGATIVE 11/27/2018 0838   HGBUR NEGATIVE 11/27/2018 0838   BILIRUBINUR NEGATIVE 11/27/2018 [place] 11/27/2018 0838   PROTEINUR NEGATIVE 11/27/2018 0838   UROBILINOGEN 0.2 05/10/2011 0725   NITRITE NEGATIVE 11/27/2018 0838   LEUKOCYTESUR NEGATIVE 11/27/2018 0838    STUDIES: No results  found.   ASSESSMENT: 87 y.o. man with M spike noted on 09/12/2019 and referred to hematology for evaluation  1. MGUS  (a) M spike noted on 09/12/2019 in beta-gamma region  (b) additional work-up shows  monoclonal IgG and IgM spikes, both with lambda restriction  (c) no hypercalcemia, worsening renal function, anemia, or or lytic bone lesions noted   PLAN: [place] this coming up in a year from initial diagnosis of his MGUS.  We reviewed the fact that this is not a cancer and that may never become a cancer and that it requires no treatment at present but only observation.  Charles Terry has been wondering what the point is of following this to see if it becomes multiple myeloma since [place] she feels would not except chemotherapy.  We discussed the fact that multiple myeloma treatments are extremely benign including for example some pills and most of them are not chemotherapy but immunomodulation treatments.  At this point I feel he does not need to be followed every 3 months as we have been doing.  He is going to see [place] again in about 6 months and from there we could consider 67-monthlab work with yearly visits versus once a year labs and visits.  They are agreeable to this and that appointment has been entered.  Total encounter time 25 minutes.*Sarajane JewsC. Levorn Oleski, MD 03/31/2021 5:19 PM Medical Oncology and Hemat[facility] [address]Tel. [phone]   Fax. [phone]  I, KWilburn Mylar am acting as scribe for Dr. GVirgie Dad Anicia Leuthold.  I, GLurline DelMD, have reviewed the above documentation for accuracy and completeness, and I agree with the above.   *Total Encounter Time as defined by the Centers for Medicare and Medicaid Services includes, in addition to the face-to-face time of a patient visit (documented in the note above) non-face-to-face time: obtaining and reviewing outside history, ordering and reviewing medications,  tests or procedures, care coordination (communications with other health care professionals or caregivers) and documentation in the medical record.

## 2021-07-09 DIAGNOSIS — H5703 Miosis: Secondary | ICD-10-CM | POA: Diagnosis not present

## 2021-07-09 DIAGNOSIS — H2511 Age-related nuclear cataract, right eye: Secondary | ICD-10-CM | POA: Diagnosis not present

## 2021-08-11 ENCOUNTER — Ambulatory Visit: Payer: Medicare Other | Admitting: Cardiology

## 2021-09-02 DIAGNOSIS — H2511 Age-related nuclear cataract, right eye: Secondary | ICD-10-CM | POA: Diagnosis not present

## 2021-09-02 DIAGNOSIS — H5703 Miosis: Secondary | ICD-10-CM | POA: Diagnosis not present

## 2021-09-12 DIAGNOSIS — H2512 Age-related nuclear cataract, left eye: Secondary | ICD-10-CM | POA: Diagnosis not present

## 2021-09-14 ENCOUNTER — Other Ambulatory Visit: Payer: Self-pay

## 2021-09-14 ENCOUNTER — Encounter: Payer: Self-pay | Admitting: Nurse Practitioner

## 2021-09-14 ENCOUNTER — Ambulatory Visit: Payer: Medicare Other | Admitting: Nurse Practitioner

## 2021-09-14 VITALS — BP 120/60 | HR 65 | Ht 67.0 in | Wt 154.0 lb

## 2021-09-14 DIAGNOSIS — I82519 Chronic embolism and thrombosis of unspecified femoral vein: Secondary | ICD-10-CM

## 2021-09-14 DIAGNOSIS — I2510 Atherosclerotic heart disease of native coronary artery without angina pectoris: Secondary | ICD-10-CM | POA: Diagnosis not present

## 2021-09-14 DIAGNOSIS — D472 Monoclonal gammopathy: Secondary | ICD-10-CM

## 2021-09-14 DIAGNOSIS — E782 Mixed hyperlipidemia: Secondary | ICD-10-CM

## 2021-09-14 DIAGNOSIS — Z9889 Other specified postprocedural states: Secondary | ICD-10-CM

## 2021-09-14 DIAGNOSIS — Z951 Presence of aortocoronary bypass graft: Secondary | ICD-10-CM

## 2021-09-14 DIAGNOSIS — R011 Cardiac murmur, unspecified: Secondary | ICD-10-CM

## 2021-09-14 DIAGNOSIS — I10 Essential (primary) hypertension: Secondary | ICD-10-CM

## 2021-09-14 DIAGNOSIS — I712 Thoracic aortic aneurysm, without rupture, unspecified: Secondary | ICD-10-CM

## 2021-09-14 MED ORDER — LOSARTAN POTASSIUM 100 MG PO TABS: ORAL_TABLET | ORAL | 3 refills | Status: DC

## 2021-09-14 NOTE — Progress Notes (Signed)
? ? ?Office Visit  ?  ?Patient Name: Charles Terry ?Date of Encounter: 09/14/2021 ? ?Primary Care Provider:  Deland Pretty, MD ?Primary Cardiologist:  Glenetta Hew, MD ? ?Chief Complaint  ?  ?88 year old male with a history of CAD s/p CABG x 5 in 2005, AAA repair 2006, thoracic aortic aneurysm without rupture s/p TEVAR in 2020, hypertension, hyperlipidemia, DVT s/p IVC filter, BPH, arthritis, M-GUS, and dementia who presents for follow-up related to CAD.  ? ?Past Medical History  ?  ?Past Medical History:  ?Diagnosis Date  ? Arthritis   ? BPH (benign prostatic hypertrophy) with urinary obstruction 11/12  ? surg  ? Coronary artery disease involving native coronary artery 2005  ? 100% LAD, 90% proximal circumflex followed by 80% OM1, 100% RCA. Right PDA and left DL in a codominant system --> CABG '05. low risk Nuc 5/12  ? Dementia ([place])   ? Dyslipidemia   ? Essential hypertension   ? H/O transfusion of packed red blood cells MAY 2012  ? History of AAA (abdominal aortic aneurysm) repair 2006  ? AAA R&G Hemasheild 14 mm x 10 mm (Dr. Donnetta Hutching);; CT scan in 2013 shows 4.8 cm thoracic aortic aneurysm - the followed by Dr. Donnetta Hutching  ? History of blood clots   ? right lower extremity dvt  after inguinal hernia surgery may 2012--doppler follow up report 04/07/11 at [facility] heart and vaxcular shows resolution of the dvt--but pt is to have IVC filter placed 05/05/11--prior to planned prostate surgery scheduled for 05/09/11.  ? History of DVT of lower extremity   ? right lower extremity dvt  after inguinal hernia surgery may 2012--doppler follow up report 04/07/11 at [facility] heart and vaxcular shows resolution of the dvt--but pt is to have IVC filter placed 05/05/11--prior to planned prostate surgery scheduled for 05/09/11.  ? History of hematuria   ? Memory disorder 02/14/2019  ? Renal cyst may 2012  ? bleeding from right renal cyst /acute renal failure secondary to urinary retention from bph --pt states  the cyst is no  longer a problem--pt still has indwelling foley catheter--plans  open  suprapubic prostatectomy on 11/19 with dr. Gaynelle Arabian  ? S/P CABG x 5 2005  ?  LIMA-LAD, SeqSVG-OM1-OM2, Seq SVG- RPDA-LPL  ? S/P insertion of IVC (inferior vena caval) filter 05/05/11  ? prior to prostate surgery  ? ?Past Surgical History:  ?Procedure Laterality Date  ? 2D ECHOCARDIOGRAM  11/02/2010; June 2017  ? a. EF 45-50%, w/ anterior hypokinesis, otherwise normal; mildly sclerotic aortic valve;; b.EF 60-65%. No regional wall motion abnormality. Grade 1 diastolic dysfunction. Mildly reduced RV function. Mild elevation of pulmonary pressures   ? AAA RPAIR  03/29/2005  ? 14x2m Hemashield graft  ? CARDIAC CATHETERIZATION  12/23/2003  ? Recommended CABG; 100% LAD, 90% proximal circumflex followed by 80% OM1, 100% RCA. Right PDA and left DL in a codominant system.  ? CORONARY ARTERY BYPASS GRAFT  12/25/2003  ? x5, LIMA-LAD, SeqSVG-OM1-OM2, Seq SVG- RPDA-LPL  ? CYSTOSCOPY N/A 11/30/2018  ? Procedure: Cystoscopy Flexible;  Surgeon: EFestus Aloe MD;  [facility]  Service: Urology;  Laterality: N/A;  ? HERNIA REPAIR Left   ? MAY 2012  ? IVC  11/12  ? placed prior to prostate surg  ? PERSANTINE MYOVIEW  10/22/2010  ? Normal perfusion  ? PROSTATECTOMY  05/09/2011  ? Procedure: PROSTATECTOMY SUPRAPUBIC;  Surgeon: SAilene Rud MD;  Location: [facility];  Service: Urology;  Laterality: N/A;  Open  ? THORACIC  AORTIC ENDOVASCULAR STENT GRAFT Right 11/30/2018  ? Procedure: THORACIC AORTIC ENDOVASCULAR STENT GRAFT;  Surgeon: Rosetta Posner, MD;  Location: [facility] OR;  Service: Vascular;  Laterality: Right;  ? TRANSURETHRAL RESECTION OF PROSTATE N/A 12/24/2015  ? Procedure: TRANSURETHRAL RESECTION OF THE PROSTATE (TURP);  Surgeon: Carolan Clines, MD;  Location: [facility];  Service: Urology;  Laterality: N/A;  ? VENOUS DOPPLER  04/04/2011  ? No evidence of thrombus or thrombophlebitis  ? ? ?Allergies ? ?Allergies  ?Allergen Reactions  ? Food Anaphylaxis  ?   NO KIWI FRUIT ?  ? Aspirin Rash and Other (See Comments)  ?  ANGIOEDEMA  ? ? ?History of Present Illness  ?  ?88 year old male with the above past medical history including CAD s/p CABG x 5 in 2005, AAA repair 2006, thoracic aortic aneurysm without rupture s/p TEVAR in 2020, hypertension, hyperlipidemia, DVT s/p IVC filter, BPH, arthritis, M-GUS, and dementia.  ? ?History of CAD s/p CABG x5 (LIMA-LAD,SeqSVG-OM1 -OM2, Seq SVG- RPDA-LPL) in 2005. Stress test in 2012 was normal. Echocardiogram in 2017 showed EF 60 to 65%, G1 DD, no significant valvular abnormalities.  History of AAA repair in 2006 and TEVAR in June 2020, followed by vascular surgery. Additionally, he has a history of  dilation of the ascending aortic, 4 cm on most recent CT chest in 2021.  He has a history of DVT s/p IVC filter. He also has a history of MGUS, followed by oncology. He was last seen in the office on 07/23/2020 and was stable from a cardiac standpoint. He denied any symptoms concerning for angina. He did report mild dizziness, beta-blocker was discontinued. ? ?He presents today for follow-up accompanied by his wife.  Since his last visit he has done well from a cardiac standpoint.  He is walking 1 mile daily and tolerating this well.  He denies symptoms concerning for angina. Overall, he reports feeling well denies any specific concerns or complaints today. ? ?Home Medications  ?  ?Current Outpatient Medications  ?Medication Sig Dispense Refill  ? Calcium Carbonate (CALTRATE 600 PO) Take 600 mg by mouth 2 (two) times daily.     ? Cholecalciferol (D3-1000 PO) Take 1,000 mg by mouth daily.    ? clopidogrel (PLAVIX) 75 MG tablet Take 1 tablet (75 mg total) by mouth every other day. 90 tablet 3  ? donepezil (ARICEPT) 10 MG tablet Take 1 tablet (10 mg total) by mouth at bedtime. 90 tablet 3  ? fish oil-omega-3 fatty acids 1000 MG capsule Take 1 g by mouth daily.    ? Multiple Vitamins-Minerals (MULTIVITAMINS THER. W/MINERALS) TABS Take 1 tablet  by mouth daily.    ? prednisoLONE Acet-Moxifloxacin 1-0.5 % SUSP Place 1 drop into the right eye 3 (three) times daily.    ? simvastatin (ZOCOR) 40 MG tablet Take 20 mg by mouth at bedtime.    ? tamsulosin (FLOMAX) 0.4 MG CAPS capsule Take 0.4 mg by mouth daily after supper.     ? losartan (COZAAR) 100 MG tablet Take 1/2 (one-half) tablet by mouth once daily 45 tablet 3  ? ?No current facility-administered medications for this visit.  ?  ? ?Review of Systems  ?  ?He denies chest pain, palpitations, dyspnea, pnd, orthopnea, n, v, dizziness, syncope, edema, weight gain, or early satiety. All other systems reviewed and are otherwise negative except as noted above.  ? ?Physical Exam  ?  ?VS:  BP 120/60   Pulse 65   Ht '5\' 7"'$  (1.702 m)  Wt 154 lb (69.9 kg)   SpO2 99%   BMI 24.12 kg/m?   ?GEN: Well nourished, well developed, in no acute distress. ?HEENT: normal. ?Neck: Supple, no JVD, carotid bruits, or masses. ?Cardiac: RRR, 2/6 murmur, no rubs, or gallops. No clubbing, cyanosis, edema.  Radials/DP/PT 2+ and equal bilaterally.  ?Respiratory:  Respirations regular and unlabored, clear to auscultation bilaterally. ?GI: Soft, nontender, nondistended, BS + x 4. ?MS: no deformity or atrophy. ?Skin: warm and dry, no rash. ?Neuro:  Strength and sensation are intact. ?Psych: Normal affect. ? ?Accessory Clinical Findings  ?  ?ECG personally reviewed by me today - NSR, 65 bpm, incomplete RBBB - no acute changes. ? ?Lab Results  ?Component Value Date  ? WBC 7.1 03/24/2021  ? HGB 14.0 03/24/2021  ? HCT 41.5 03/24/2021  ? MCV 93.7 03/24/2021  ? PLT 159 03/24/2021  ? ?Lab Results  ?Component Value Date  ? CREATININE 1.21 03/24/2021  ? BUN 20 03/24/2021  ? NA 138 03/24/2021  ? K 4.0 03/24/2021  ? CL 102 03/24/2021  ? CO2 26 03/24/2021  ? ?Lab Results  ?Component Value Date  ? ALT 17 03/24/2021  ? AST 21 03/24/2021  ? ALKPHOS 46 03/24/2021  ? BILITOT 0.5 03/24/2021  ? ?Lab Results  ?Component Value Date  ? CHOL 83 11/20/2015  ? HDL  31 (L) 11/20/2015  ? [place] 38 11/20/2015  ? TRIG 72 11/20/2015  ? CHOLHDL 2.7 11/20/2015  ?  ?Lab Results  ?Component Value Date  ? HGBA1C (H) 11/25/2010  ?  6.4 ?(NOTE)

## 2021-09-14 NOTE — Patient Instructions (Signed)
Medication Instructions:  ?Your physician recommends that you continue on your current medications as directed. Please refer to the Current Medication list given to you today.  ? ?*If you need a refill on your cardiac medications before your next appointment, please call your pharmacy* ? ? ?Lab Work: ?NONE ordered at this time of appointment  ? ? ?Testing/Procedures: ?NONE ordered at this time of appointment  ? ? ?Follow-Up: ?At [facility], you and your health needs are our priority.  As part of our continuing mission to provide you with exceptional heart care, we have created designated Provider Care Teams.  These Care Teams include your primary Cardiologist (physician) and Advanced Practice Providers (APPs -  Physician Assistants and Nurse Practitioners) who all work together to provide you with the care you need, when you need it. ? ?We recommend signing up for the patient portal called "MyChart".  Sign up information is provided on this After Visit Summary.  MyChart is used to connect with patients for Virtual Visits (Telemedicine).  Patients are able to view lab/test results, encounter notes, upcoming appointments, etc.  Non-urgent messages can be sent to your provider as well.   ?To learn more about what you can do with MyChart, go to [email or web address].   ? ?Your next appointment:   ?1 year(s) ? ?The format for your next appointment:   ?In Person ? ?Provider:   ?Glenetta Hew, MD   ? ? ? ? ? ?

## 2021-09-16 DIAGNOSIS — H5703 Miosis: Secondary | ICD-10-CM | POA: Diagnosis not present

## 2021-09-16 DIAGNOSIS — H2512 Age-related nuclear cataract, left eye: Secondary | ICD-10-CM | POA: Diagnosis not present

## 2021-09-30 ENCOUNTER — Other Ambulatory Visit: Payer: Medicare Other

## 2021-10-07 ENCOUNTER — Ambulatory Visit: Payer: Medicare Other | Admitting: Hematology and Oncology

## 2021-12-31 DIAGNOSIS — E785 Hyperlipidemia, unspecified: Secondary | ICD-10-CM | POA: Diagnosis not present

## 2021-12-31 DIAGNOSIS — R7303 Prediabetes: Secondary | ICD-10-CM | POA: Diagnosis not present

## 2021-12-31 DIAGNOSIS — D696 Thrombocytopenia, unspecified: Secondary | ICD-10-CM | POA: Diagnosis not present

## 2021-12-31 DIAGNOSIS — I10 Essential (primary) hypertension: Secondary | ICD-10-CM | POA: Diagnosis not present

## 2022-01-05 DIAGNOSIS — I700 Atherosclerosis of aorta: Secondary | ICD-10-CM | POA: Diagnosis not present

## 2022-01-05 DIAGNOSIS — Z951 Presence of aortocoronary bypass graft: Secondary | ICD-10-CM | POA: Diagnosis not present

## 2022-01-05 DIAGNOSIS — Z0000 Encounter for general adult medical examination without abnormal findings: Secondary | ICD-10-CM | POA: Diagnosis not present

## 2022-01-05 DIAGNOSIS — I10 Essential (primary) hypertension: Secondary | ICD-10-CM | POA: Diagnosis not present

## 2022-01-12 ENCOUNTER — Other Ambulatory Visit: Payer: Self-pay

## 2022-01-12 DIAGNOSIS — I7123 Aneurysm of the descending thoracic aorta, without rupture: Secondary | ICD-10-CM

## 2022-01-12 DIAGNOSIS — I714 Abdominal aortic aneurysm, without rupture, unspecified: Secondary | ICD-10-CM

## 2022-01-14 ENCOUNTER — Other Ambulatory Visit: Payer: Self-pay | Admitting: Vascular Surgery

## 2022-01-14 DIAGNOSIS — I7123 Aneurysm of the descending thoracic aorta, without rupture: Secondary | ICD-10-CM

## 2022-01-14 DIAGNOSIS — I714 Abdominal aortic aneurysm, without rupture, unspecified: Secondary | ICD-10-CM

## 2022-01-19 ENCOUNTER — Telehealth: Payer: Self-pay | Admitting: Vascular Surgery

## 2022-01-19 NOTE — Telephone Encounter (Signed)
Spoke to pt's wife on 01/13/22. She wants to delay CT scan until the Fall. I made the appt for October

## 2022-02-01 ENCOUNTER — Other Ambulatory Visit: Payer: Medicare Other

## 2022-02-09 ENCOUNTER — Ambulatory Visit: Payer: Medicare Other | Admitting: Vascular Surgery

## 2022-03-08 ENCOUNTER — Other Ambulatory Visit: Payer: Self-pay

## 2022-03-08 DIAGNOSIS — I7123 Aneurysm of the descending thoracic aorta, without rupture: Secondary | ICD-10-CM

## 2022-03-08 DIAGNOSIS — I714 Abdominal aortic aneurysm, without rupture, unspecified: Secondary | ICD-10-CM

## 2022-03-21 ENCOUNTER — Other Ambulatory Visit: Payer: Medicare Other

## 2022-03-25 DIAGNOSIS — Z23 Encounter for immunization: Secondary | ICD-10-CM | POA: Diagnosis not present

## 2022-03-31 DIAGNOSIS — N401 Enlarged prostate with lower urinary tract symptoms: Secondary | ICD-10-CM | POA: Diagnosis not present

## 2022-03-31 DIAGNOSIS — R351 Nocturia: Secondary | ICD-10-CM | POA: Diagnosis not present

## 2022-04-13 ENCOUNTER — Ambulatory Visit: Payer: Medicare Other | Admitting: Vascular Surgery

## 2022-05-24 DIAGNOSIS — Z961 Presence of intraocular lens: Secondary | ICD-10-CM | POA: Diagnosis not present

## 2022-05-24 DIAGNOSIS — H5703 Miosis: Secondary | ICD-10-CM | POA: Diagnosis not present

## 2022-05-24 DIAGNOSIS — H47091 Other disorders of optic nerve, not elsewhere classified, right eye: Secondary | ICD-10-CM | POA: Diagnosis not present

## 2022-09-20 ENCOUNTER — Other Ambulatory Visit: Payer: Self-pay | Admitting: Nurse Practitioner

## 2022-11-08 DIAGNOSIS — K080 Exfoliation of teeth due to systemic causes: Secondary | ICD-10-CM | POA: Diagnosis not present

## 2022-11-23 DIAGNOSIS — Z961 Presence of intraocular lens: Secondary | ICD-10-CM | POA: Diagnosis not present

## 2022-11-23 DIAGNOSIS — H47091 Other disorders of optic nerve, not elsewhere classified, right eye: Secondary | ICD-10-CM | POA: Diagnosis not present

## 2022-11-23 DIAGNOSIS — H5703 Miosis: Secondary | ICD-10-CM | POA: Diagnosis not present

## 2023-01-04 DIAGNOSIS — I10 Essential (primary) hypertension: Secondary | ICD-10-CM | POA: Diagnosis not present

## 2023-01-04 DIAGNOSIS — R7303 Prediabetes: Secondary | ICD-10-CM | POA: Diagnosis not present

## 2023-01-05 LAB — LAB REPORT - SCANNED
A1c: 6.3
eGFR: 66

## 2023-01-11 DIAGNOSIS — Z0000 Encounter for general adult medical examination without abnormal findings: Secondary | ICD-10-CM | POA: Diagnosis not present

## 2023-01-19 DIAGNOSIS — R2689 Other abnormalities of gait and mobility: Secondary | ICD-10-CM | POA: Diagnosis not present

## 2023-03-03 DIAGNOSIS — Z23 Encounter for immunization: Secondary | ICD-10-CM | POA: Diagnosis not present

## 2023-05-01 DIAGNOSIS — R351 Nocturia: Secondary | ICD-10-CM | POA: Diagnosis not present

## 2023-05-01 DIAGNOSIS — N401 Enlarged prostate with lower urinary tract symptoms: Secondary | ICD-10-CM | POA: Diagnosis not present

## 2023-05-30 DIAGNOSIS — K080 Exfoliation of teeth due to systemic causes: Secondary | ICD-10-CM | POA: Diagnosis not present

## 2023-05-31 DIAGNOSIS — K080 Exfoliation of teeth due to systemic causes: Secondary | ICD-10-CM | POA: Diagnosis not present

## 2023-06-05 DIAGNOSIS — K080 Exfoliation of teeth due to systemic causes: Secondary | ICD-10-CM | POA: Diagnosis not present

## 2023-07-17 DIAGNOSIS — E1121 Type 2 diabetes mellitus with diabetic nephropathy: Secondary | ICD-10-CM | POA: Diagnosis not present

## 2023-07-17 DIAGNOSIS — I2510 Atherosclerotic heart disease of native coronary artery without angina pectoris: Secondary | ICD-10-CM | POA: Diagnosis not present

## 2023-07-17 DIAGNOSIS — I10 Essential (primary) hypertension: Secondary | ICD-10-CM | POA: Diagnosis not present

## 2023-08-12 DIAGNOSIS — H9201 Otalgia, right ear: Secondary | ICD-10-CM | POA: Diagnosis not present

## 2024-01-10 DIAGNOSIS — R7303 Prediabetes: Secondary | ICD-10-CM | POA: Diagnosis not present

## 2024-01-10 DIAGNOSIS — I10 Essential (primary) hypertension: Secondary | ICD-10-CM | POA: Diagnosis not present

## 2024-01-10 DIAGNOSIS — E785 Hyperlipidemia, unspecified: Secondary | ICD-10-CM | POA: Diagnosis not present

## 2024-01-15 DIAGNOSIS — E1121 Type 2 diabetes mellitus with diabetic nephropathy: Secondary | ICD-10-CM | POA: Diagnosis not present

## 2024-01-15 DIAGNOSIS — N401 Enlarged prostate with lower urinary tract symptoms: Secondary | ICD-10-CM | POA: Diagnosis not present

## 2024-01-15 DIAGNOSIS — I2510 Atherosclerotic heart disease of native coronary artery without angina pectoris: Secondary | ICD-10-CM | POA: Diagnosis not present

## 2024-01-15 DIAGNOSIS — I70209 Unspecified atherosclerosis of native arteries of extremities, unspecified extremity: Secondary | ICD-10-CM | POA: Diagnosis not present

## 2024-01-15 DIAGNOSIS — Z0000 Encounter for general adult medical examination without abnormal findings: Secondary | ICD-10-CM | POA: Diagnosis not present

## 2024-02-06 DIAGNOSIS — K080 Exfoliation of teeth due to systemic causes: Secondary | ICD-10-CM | POA: Diagnosis not present

## 2024-03-29 DIAGNOSIS — Z23 Encounter for immunization: Secondary | ICD-10-CM | POA: Diagnosis not present
